# Patient Record
Sex: Female | Born: 1965 | Race: Black or African American | Hispanic: No | Marital: Married | Smoking: Never smoker
Health system: Southern US, Community
[De-identification: ages and names within clinical notes are randomized; demographics above are authoritative.]

## PROBLEM LIST (undated history)

## (undated) DIAGNOSIS — K219 Gastro-esophageal reflux disease without esophagitis: Secondary | ICD-10-CM

## (undated) DIAGNOSIS — M797 Fibromyalgia: Secondary | ICD-10-CM

## (undated) DIAGNOSIS — F329 Major depressive disorder, single episode, unspecified: Secondary | ICD-10-CM

## (undated) DIAGNOSIS — J449 Chronic obstructive pulmonary disease, unspecified: Secondary | ICD-10-CM

## (undated) DIAGNOSIS — J42 Unspecified chronic bronchitis: Secondary | ICD-10-CM

## (undated) DIAGNOSIS — E785 Hyperlipidemia, unspecified: Secondary | ICD-10-CM

## (undated) DIAGNOSIS — M545 Low back pain, unspecified: Secondary | ICD-10-CM

## (undated) DIAGNOSIS — T7840XA Allergy, unspecified, initial encounter: Secondary | ICD-10-CM

## (undated) DIAGNOSIS — M1990 Unspecified osteoarthritis, unspecified site: Secondary | ICD-10-CM

## (undated) DIAGNOSIS — F419 Anxiety disorder, unspecified: Secondary | ICD-10-CM

## (undated) DIAGNOSIS — F32A Depression, unspecified: Secondary | ICD-10-CM

## (undated) HISTORY — DX: Fibromyalgia: M79.7

## (undated) HISTORY — DX: Unspecified osteoarthritis, unspecified site: M19.90

## (undated) HISTORY — DX: Allergy, unspecified, initial encounter: T78.40XA

## (undated) HISTORY — PX: ABDOMINAL HYSTERECTOMY: SHX81

## (undated) HISTORY — DX: Hyperlipidemia, unspecified: E78.5

## (undated) HISTORY — PX: WISDOM TOOTH EXTRACTION: SHX21

## (undated) HISTORY — DX: Low back pain: M54.5

## (undated) HISTORY — DX: Unspecified chronic bronchitis: J42

## (undated) HISTORY — DX: Low back pain, unspecified: M54.50

## (undated) HISTORY — DX: Gastro-esophageal reflux disease without esophagitis: K21.9

---

## 1997-12-18 ENCOUNTER — Encounter: Admission: RE | Admit: 1997-12-18 | Discharge: 1997-12-18 | Payer: Self-pay | Admitting: Internal Medicine

## 1997-12-29 ENCOUNTER — Ambulatory Visit (HOSPITAL_COMMUNITY): Admission: RE | Admit: 1997-12-29 | Discharge: 1997-12-29 | Payer: Self-pay | Admitting: Obstetrics

## 1998-01-01 ENCOUNTER — Encounter: Admission: RE | Admit: 1998-01-01 | Discharge: 1998-01-01 | Payer: Self-pay | Admitting: Internal Medicine

## 1998-07-05 ENCOUNTER — Encounter: Admission: RE | Admit: 1998-07-05 | Discharge: 1998-07-05 | Payer: Self-pay | Admitting: Internal Medicine

## 1998-08-10 ENCOUNTER — Encounter: Admission: RE | Admit: 1998-08-10 | Discharge: 1998-08-10 | Payer: Self-pay | Admitting: Hematology and Oncology

## 1998-08-31 ENCOUNTER — Encounter: Admission: RE | Admit: 1998-08-31 | Discharge: 1998-08-31 | Payer: Self-pay | Admitting: Obstetrics & Gynecology

## 1998-08-31 ENCOUNTER — Other Ambulatory Visit: Admission: RE | Admit: 1998-08-31 | Discharge: 1998-08-31 | Payer: Self-pay | Admitting: Obstetrics & Gynecology

## 1999-06-28 ENCOUNTER — Encounter: Admission: RE | Admit: 1999-06-28 | Discharge: 1999-06-28 | Payer: Self-pay | Admitting: Obstetrics & Gynecology

## 1999-10-31 ENCOUNTER — Encounter: Admission: RE | Admit: 1999-10-31 | Discharge: 1999-10-31 | Payer: Self-pay | Admitting: Internal Medicine

## 1999-11-17 ENCOUNTER — Encounter: Admission: RE | Admit: 1999-11-17 | Discharge: 1999-11-17 | Payer: Self-pay | Admitting: Internal Medicine

## 2000-03-16 ENCOUNTER — Encounter: Admission: RE | Admit: 2000-03-16 | Discharge: 2000-03-16 | Payer: Self-pay | Admitting: Internal Medicine

## 2000-06-26 ENCOUNTER — Encounter: Admission: RE | Admit: 2000-06-26 | Discharge: 2000-06-26 | Payer: Self-pay | Admitting: Obstetrics & Gynecology

## 2000-06-26 ENCOUNTER — Other Ambulatory Visit: Admission: RE | Admit: 2000-06-26 | Discharge: 2000-06-26 | Payer: Self-pay | Admitting: Obstetrics & Gynecology

## 2000-07-06 ENCOUNTER — Ambulatory Visit (HOSPITAL_COMMUNITY): Admission: RE | Admit: 2000-07-06 | Discharge: 2000-07-06 | Payer: Self-pay | Admitting: Obstetrics & Gynecology

## 2000-07-27 ENCOUNTER — Encounter: Admission: RE | Admit: 2000-07-27 | Discharge: 2000-07-27 | Payer: Self-pay | Admitting: Obstetrics & Gynecology

## 2000-08-17 ENCOUNTER — Encounter: Admission: RE | Admit: 2000-08-17 | Discharge: 2000-08-17 | Payer: Self-pay | Admitting: Obstetrics & Gynecology

## 2000-09-21 ENCOUNTER — Encounter: Admission: RE | Admit: 2000-09-21 | Discharge: 2000-09-21 | Payer: Self-pay | Admitting: Obstetrics & Gynecology

## 2000-11-20 ENCOUNTER — Encounter: Admission: RE | Admit: 2000-11-20 | Discharge: 2000-11-20 | Payer: Self-pay | Admitting: Obstetrics & Gynecology

## 2001-07-25 ENCOUNTER — Encounter: Admission: RE | Admit: 2001-07-25 | Discharge: 2001-07-25 | Payer: Self-pay | Admitting: Internal Medicine

## 2001-08-02 ENCOUNTER — Encounter: Admission: RE | Admit: 2001-08-02 | Discharge: 2001-08-02 | Payer: Self-pay | Admitting: Internal Medicine

## 2001-09-24 ENCOUNTER — Other Ambulatory Visit: Admission: RE | Admit: 2001-09-24 | Discharge: 2001-09-24 | Payer: Self-pay | Admitting: Obstetrics & Gynecology

## 2001-09-24 ENCOUNTER — Encounter: Admission: RE | Admit: 2001-09-24 | Discharge: 2001-09-24 | Payer: Self-pay | Admitting: Obstetrics & Gynecology

## 2002-08-19 ENCOUNTER — Encounter: Admission: RE | Admit: 2002-08-19 | Discharge: 2002-08-19 | Payer: Self-pay | Admitting: *Deleted

## 2002-09-11 ENCOUNTER — Encounter (INDEPENDENT_AMBULATORY_CARE_PROVIDER_SITE_OTHER): Payer: Self-pay | Admitting: *Deleted

## 2002-10-03 ENCOUNTER — Encounter: Admission: RE | Admit: 2002-10-03 | Discharge: 2002-10-03 | Payer: Self-pay | Admitting: Internal Medicine

## 2002-10-03 ENCOUNTER — Other Ambulatory Visit: Admission: RE | Admit: 2002-10-03 | Discharge: 2002-10-03 | Payer: Self-pay | Admitting: Internal Medicine

## 2002-10-10 ENCOUNTER — Encounter (INDEPENDENT_AMBULATORY_CARE_PROVIDER_SITE_OTHER): Payer: Self-pay | Admitting: *Deleted

## 2002-10-10 LAB — CONVERTED CEMR LAB: Pap Smear: NORMAL

## 2002-12-02 ENCOUNTER — Inpatient Hospital Stay (HOSPITAL_COMMUNITY): Admission: AD | Admit: 2002-12-02 | Discharge: 2002-12-02 | Payer: Self-pay | Admitting: *Deleted

## 2003-01-05 ENCOUNTER — Encounter: Admission: RE | Admit: 2003-01-05 | Discharge: 2003-01-05 | Payer: Self-pay | Admitting: Internal Medicine

## 2003-01-05 ENCOUNTER — Ambulatory Visit (HOSPITAL_COMMUNITY): Admission: RE | Admit: 2003-01-05 | Discharge: 2003-01-05 | Payer: Self-pay | Admitting: Internal Medicine

## 2003-02-24 ENCOUNTER — Inpatient Hospital Stay (HOSPITAL_COMMUNITY): Admission: AD | Admit: 2003-02-24 | Discharge: 2003-02-24 | Payer: Self-pay | Admitting: *Deleted

## 2003-09-18 ENCOUNTER — Encounter: Admission: RE | Admit: 2003-09-18 | Discharge: 2003-09-18 | Payer: Self-pay | Admitting: Internal Medicine

## 2003-12-31 ENCOUNTER — Emergency Department (HOSPITAL_COMMUNITY): Admission: EM | Admit: 2003-12-31 | Discharge: 2003-12-31 | Payer: Self-pay | Admitting: Family Medicine

## 2004-04-19 ENCOUNTER — Encounter: Admission: RE | Admit: 2004-04-19 | Discharge: 2004-04-19 | Payer: Self-pay | Admitting: Internal Medicine

## 2004-05-12 ENCOUNTER — Ambulatory Visit (HOSPITAL_COMMUNITY): Admission: RE | Admit: 2004-05-12 | Discharge: 2004-05-12 | Payer: Self-pay | Admitting: Obstetrics & Gynecology

## 2004-06-03 ENCOUNTER — Encounter (INDEPENDENT_AMBULATORY_CARE_PROVIDER_SITE_OTHER): Payer: Self-pay | Admitting: *Deleted

## 2004-06-03 ENCOUNTER — Inpatient Hospital Stay (HOSPITAL_COMMUNITY): Admission: RE | Admit: 2004-06-03 | Discharge: 2004-06-05 | Payer: Self-pay | Admitting: Obstetrics & Gynecology

## 2004-08-01 ENCOUNTER — Ambulatory Visit: Payer: Self-pay | Admitting: Internal Medicine

## 2004-09-06 ENCOUNTER — Emergency Department (HOSPITAL_COMMUNITY): Admission: EM | Admit: 2004-09-06 | Discharge: 2004-09-06 | Payer: Self-pay | Admitting: Family Medicine

## 2005-01-17 ENCOUNTER — Ambulatory Visit: Payer: Self-pay | Admitting: Internal Medicine

## 2005-03-02 ENCOUNTER — Ambulatory Visit: Payer: Self-pay | Admitting: Internal Medicine

## 2005-03-02 ENCOUNTER — Ambulatory Visit (HOSPITAL_COMMUNITY): Admission: RE | Admit: 2005-03-02 | Discharge: 2005-03-02 | Payer: Self-pay | Admitting: Internal Medicine

## 2005-03-16 ENCOUNTER — Ambulatory Visit: Payer: Self-pay | Admitting: Internal Medicine

## 2005-03-27 ENCOUNTER — Ambulatory Visit (HOSPITAL_COMMUNITY): Admission: RE | Admit: 2005-03-27 | Discharge: 2005-03-27 | Payer: Self-pay | Admitting: Internal Medicine

## 2005-08-25 ENCOUNTER — Ambulatory Visit: Payer: Self-pay | Admitting: Internal Medicine

## 2005-09-11 ENCOUNTER — Emergency Department (HOSPITAL_COMMUNITY): Admission: EM | Admit: 2005-09-11 | Discharge: 2005-09-11 | Payer: Self-pay | Admitting: Emergency Medicine

## 2005-09-13 ENCOUNTER — Ambulatory Visit (HOSPITAL_COMMUNITY): Admission: RE | Admit: 2005-09-13 | Discharge: 2005-09-13 | Payer: Self-pay | Admitting: Internal Medicine

## 2005-09-13 ENCOUNTER — Ambulatory Visit: Payer: Self-pay | Admitting: Internal Medicine

## 2005-12-11 ENCOUNTER — Ambulatory Visit: Payer: Self-pay | Admitting: Hospitalist

## 2006-03-27 ENCOUNTER — Ambulatory Visit: Payer: Self-pay | Admitting: Internal Medicine

## 2006-07-02 ENCOUNTER — Ambulatory Visit: Payer: Self-pay | Admitting: Internal Medicine

## 2006-07-02 ENCOUNTER — Encounter (INDEPENDENT_AMBULATORY_CARE_PROVIDER_SITE_OTHER): Payer: Self-pay | Admitting: *Deleted

## 2006-07-04 ENCOUNTER — Encounter (INDEPENDENT_AMBULATORY_CARE_PROVIDER_SITE_OTHER): Payer: Self-pay | Admitting: *Deleted

## 2006-07-04 ENCOUNTER — Ambulatory Visit (HOSPITAL_COMMUNITY): Admission: RE | Admit: 2006-07-04 | Discharge: 2006-07-04 | Payer: Self-pay | Admitting: Obstetrics & Gynecology

## 2006-07-13 ENCOUNTER — Encounter (INDEPENDENT_AMBULATORY_CARE_PROVIDER_SITE_OTHER): Payer: Self-pay | Admitting: *Deleted

## 2006-07-13 DIAGNOSIS — M545 Low back pain: Secondary | ICD-10-CM

## 2006-07-13 DIAGNOSIS — E785 Hyperlipidemia, unspecified: Secondary | ICD-10-CM

## 2006-08-19 DIAGNOSIS — J309 Allergic rhinitis, unspecified: Secondary | ICD-10-CM | POA: Insufficient documentation

## 2006-09-17 ENCOUNTER — Emergency Department (HOSPITAL_COMMUNITY): Admission: EM | Admit: 2006-09-17 | Discharge: 2006-09-17 | Payer: Self-pay | Admitting: Emergency Medicine

## 2006-09-20 ENCOUNTER — Ambulatory Visit: Payer: Self-pay | Admitting: Internal Medicine

## 2006-09-20 ENCOUNTER — Encounter (INDEPENDENT_AMBULATORY_CARE_PROVIDER_SITE_OTHER): Payer: Self-pay | Admitting: Internal Medicine

## 2006-11-07 ENCOUNTER — Telehealth: Payer: Self-pay | Admitting: *Deleted

## 2007-04-09 ENCOUNTER — Ambulatory Visit: Payer: Self-pay | Admitting: *Deleted

## 2007-04-09 DIAGNOSIS — M25539 Pain in unspecified wrist: Secondary | ICD-10-CM

## 2007-04-09 DIAGNOSIS — J45990 Exercise induced bronchospasm: Secondary | ICD-10-CM

## 2007-04-09 LAB — CONVERTED CEMR LAB
ALT: 10 units/L (ref 0–35)
Albumin: 4.1 g/dL (ref 3.5–5.2)
Alkaline Phosphatase: 54 units/L (ref 39–117)
BUN: 15 mg/dL (ref 6–23)
CO2: 22 meq/L (ref 19–32)
Calcium: 8.9 mg/dL (ref 8.4–10.5)
Cholesterol: 196 mg/dL (ref 0–200)
Glucose, Bld: 87 mg/dL (ref 70–99)
Total Bilirubin: 0.4 mg/dL (ref 0.3–1.2)
Total Protein: 6.7 g/dL (ref 6.0–8.3)
Triglycerides: 85 mg/dL (ref <150)
VLDL: 17 mg/dL (ref 0–40)

## 2007-04-10 ENCOUNTER — Telehealth (INDEPENDENT_AMBULATORY_CARE_PROVIDER_SITE_OTHER): Payer: Self-pay | Admitting: *Deleted

## 2007-05-08 ENCOUNTER — Encounter (INDEPENDENT_AMBULATORY_CARE_PROVIDER_SITE_OTHER): Payer: Self-pay | Admitting: *Deleted

## 2007-05-08 ENCOUNTER — Emergency Department (HOSPITAL_COMMUNITY): Admission: EM | Admit: 2007-05-08 | Discharge: 2007-05-08 | Payer: Self-pay | Admitting: Emergency Medicine

## 2007-05-08 DIAGNOSIS — K649 Unspecified hemorrhoids: Secondary | ICD-10-CM | POA: Insufficient documentation

## 2007-05-21 ENCOUNTER — Telehealth: Payer: Self-pay | Admitting: *Deleted

## 2007-06-19 ENCOUNTER — Telehealth (INDEPENDENT_AMBULATORY_CARE_PROVIDER_SITE_OTHER): Payer: Self-pay | Admitting: *Deleted

## 2007-10-11 ENCOUNTER — Encounter (INDEPENDENT_AMBULATORY_CARE_PROVIDER_SITE_OTHER): Payer: Self-pay | Admitting: *Deleted

## 2007-10-11 ENCOUNTER — Encounter (INDEPENDENT_AMBULATORY_CARE_PROVIDER_SITE_OTHER): Payer: Self-pay | Admitting: Infectious Diseases

## 2007-10-11 ENCOUNTER — Ambulatory Visit: Payer: Self-pay | Admitting: Infectious Disease

## 2007-10-11 LAB — CONVERTED CEMR LAB: Hgb A1c MFr Bld: 5.5 %

## 2007-10-14 ENCOUNTER — Telehealth (INDEPENDENT_AMBULATORY_CARE_PROVIDER_SITE_OTHER): Payer: Self-pay | Admitting: Infectious Diseases

## 2007-10-14 LAB — CONVERTED CEMR LAB
Basophils Absolute: 0 10*3/uL (ref 0.0–0.1)
Eosinophils Absolute: 0 10*3/uL (ref 0.0–0.7)
Eosinophils Relative: 1 % (ref 0–5)
LDL Cholesterol: 137 mg/dL — ABNORMAL HIGH (ref 0–99)
Lymphocytes Relative: 39 % (ref 12–46)
MCV: 87.7 fL (ref 78.0–100.0)
Neutro Abs: 2.7 10*3/uL (ref 1.7–7.7)
Platelets: 277 10*3/uL (ref 150–400)
RBC: 4.94 M/uL (ref 3.87–5.11)
RDW: 12.4 % (ref 11.5–15.5)
Total CHOL/HDL Ratio: 3.8
WBC: 5.2 10*3/uL (ref 4.0–10.5)

## 2007-10-18 ENCOUNTER — Encounter (INDEPENDENT_AMBULATORY_CARE_PROVIDER_SITE_OTHER): Payer: Self-pay | Admitting: *Deleted

## 2007-10-28 ENCOUNTER — Encounter (INDEPENDENT_AMBULATORY_CARE_PROVIDER_SITE_OTHER): Payer: Self-pay | Admitting: *Deleted

## 2007-11-13 ENCOUNTER — Telehealth: Payer: Self-pay | Admitting: *Deleted

## 2007-12-02 ENCOUNTER — Ambulatory Visit: Payer: Self-pay | Admitting: *Deleted

## 2008-03-09 ENCOUNTER — Ambulatory Visit: Payer: Self-pay | Admitting: Internal Medicine

## 2008-03-09 ENCOUNTER — Encounter (INDEPENDENT_AMBULATORY_CARE_PROVIDER_SITE_OTHER): Payer: Self-pay | Admitting: *Deleted

## 2008-03-09 DIAGNOSIS — M542 Cervicalgia: Secondary | ICD-10-CM | POA: Insufficient documentation

## 2008-03-10 LAB — CONVERTED CEMR LAB
Candida species: POSITIVE — AB
Chlamydia, DNA Probe: NEGATIVE
HDL: 52 mg/dL (ref >39)
LDL Cholesterol: 137 mg/dL — ABNORMAL HIGH (ref 0–99)
Total CHOL/HDL Ratio: 4.2
Triglycerides: 145 mg/dL (ref <150)
VLDL: 29 mg/dL (ref 0–40)

## 2008-03-11 ENCOUNTER — Telehealth: Payer: Self-pay | Admitting: *Deleted

## 2008-03-17 ENCOUNTER — Ambulatory Visit: Payer: Self-pay | Admitting: *Deleted

## 2008-03-17 ENCOUNTER — Encounter: Payer: Self-pay | Admitting: Internal Medicine

## 2008-03-17 LAB — CONVERTED CEMR LAB
Candida species: NEGATIVE
Chlamydia, DNA Probe: NEGATIVE
Trichomonal Vaginitis: NEGATIVE

## 2008-03-30 ENCOUNTER — Telehealth (INDEPENDENT_AMBULATORY_CARE_PROVIDER_SITE_OTHER): Payer: Self-pay | Admitting: *Deleted

## 2008-06-05 ENCOUNTER — Telehealth: Payer: Self-pay | Admitting: Internal Medicine

## 2008-09-02 ENCOUNTER — Telehealth (INDEPENDENT_AMBULATORY_CARE_PROVIDER_SITE_OTHER): Payer: Self-pay | Admitting: Internal Medicine

## 2008-09-07 ENCOUNTER — Emergency Department (HOSPITAL_COMMUNITY): Admission: EM | Admit: 2008-09-07 | Discharge: 2008-09-07 | Payer: Self-pay | Admitting: Family Medicine

## 2008-09-29 ENCOUNTER — Ambulatory Visit (HOSPITAL_COMMUNITY): Admission: RE | Admit: 2008-09-29 | Discharge: 2008-09-29 | Payer: Self-pay | Admitting: *Deleted

## 2008-09-29 ENCOUNTER — Ambulatory Visit: Payer: Self-pay | Admitting: *Deleted

## 2008-09-29 DIAGNOSIS — G609 Hereditary and idiopathic neuropathy, unspecified: Secondary | ICD-10-CM

## 2008-09-29 DIAGNOSIS — G4700 Insomnia, unspecified: Secondary | ICD-10-CM | POA: Insufficient documentation

## 2008-09-29 DIAGNOSIS — G629 Polyneuropathy, unspecified: Secondary | ICD-10-CM | POA: Insufficient documentation

## 2008-09-29 DIAGNOSIS — R002 Palpitations: Secondary | ICD-10-CM | POA: Insufficient documentation

## 2008-09-29 LAB — CONVERTED CEMR LAB
ALT: 12 units/L (ref 0–35)
Alkaline Phosphatase: 73 units/L (ref 39–117)
HDL: 53 mg/dL (ref >39)
LDL Cholesterol: 152 mg/dL — ABNORMAL HIGH (ref 0–99)
Sodium: 141 meq/L (ref 135–145)
Triglycerides: 125 mg/dL (ref <150)

## 2008-09-30 ENCOUNTER — Ambulatory Visit (HOSPITAL_COMMUNITY): Admission: RE | Admit: 2008-09-30 | Discharge: 2008-09-30 | Payer: Self-pay | Admitting: *Deleted

## 2008-09-30 ENCOUNTER — Encounter (INDEPENDENT_AMBULATORY_CARE_PROVIDER_SITE_OTHER): Payer: Self-pay | Admitting: *Deleted

## 2008-10-07 ENCOUNTER — Encounter: Admission: RE | Admit: 2008-10-07 | Discharge: 2008-10-07 | Payer: Self-pay | Admitting: Internal Medicine

## 2008-10-09 ENCOUNTER — Ambulatory Visit: Payer: Self-pay | Admitting: Internal Medicine

## 2008-10-09 ENCOUNTER — Encounter (INDEPENDENT_AMBULATORY_CARE_PROVIDER_SITE_OTHER): Payer: Self-pay | Admitting: Internal Medicine

## 2008-10-27 ENCOUNTER — Ambulatory Visit: Payer: Self-pay | Admitting: *Deleted

## 2008-10-27 DIAGNOSIS — R143 Flatulence: Secondary | ICD-10-CM

## 2008-10-27 DIAGNOSIS — R142 Eructation: Secondary | ICD-10-CM

## 2008-10-27 DIAGNOSIS — R141 Gas pain: Secondary | ICD-10-CM

## 2008-11-09 ENCOUNTER — Ambulatory Visit (HOSPITAL_COMMUNITY): Admission: RE | Admit: 2008-11-09 | Discharge: 2008-11-09 | Payer: Self-pay | Admitting: *Deleted

## 2008-11-24 ENCOUNTER — Ambulatory Visit: Payer: Self-pay | Admitting: *Deleted

## 2008-11-25 ENCOUNTER — Encounter: Admission: RE | Admit: 2008-11-25 | Discharge: 2008-12-25 | Payer: Self-pay | Admitting: *Deleted

## 2008-12-03 ENCOUNTER — Encounter (INDEPENDENT_AMBULATORY_CARE_PROVIDER_SITE_OTHER): Payer: Self-pay | Admitting: *Deleted

## 2008-12-03 ENCOUNTER — Ambulatory Visit (HOSPITAL_COMMUNITY): Admission: RE | Admit: 2008-12-03 | Discharge: 2008-12-03 | Payer: Self-pay | Admitting: *Deleted

## 2008-12-21 ENCOUNTER — Telehealth (INDEPENDENT_AMBULATORY_CARE_PROVIDER_SITE_OTHER): Payer: Self-pay | Admitting: *Deleted

## 2009-01-04 ENCOUNTER — Telehealth: Payer: Self-pay | Admitting: *Deleted

## 2009-01-05 ENCOUNTER — Inpatient Hospital Stay (HOSPITAL_COMMUNITY): Admission: AD | Admit: 2009-01-05 | Discharge: 2009-01-05 | Payer: Self-pay | Admitting: Obstetrics & Gynecology

## 2009-01-05 ENCOUNTER — Ambulatory Visit: Payer: Self-pay | Admitting: Advanced Practice Midwife

## 2009-01-27 ENCOUNTER — Ambulatory Visit: Payer: Self-pay | Admitting: *Deleted

## 2009-01-27 ENCOUNTER — Telehealth: Payer: Self-pay | Admitting: *Deleted

## 2009-01-27 LAB — CONVERTED CEMR LAB
Total CHOL/HDL Ratio: 4.5
Triglycerides: 91 mg/dL (ref <150)

## 2009-02-04 ENCOUNTER — Ambulatory Visit (HOSPITAL_COMMUNITY): Admission: RE | Admit: 2009-02-04 | Discharge: 2009-02-04 | Payer: Self-pay | Admitting: Internal Medicine

## 2009-02-04 ENCOUNTER — Telehealth (INDEPENDENT_AMBULATORY_CARE_PROVIDER_SITE_OTHER): Payer: Self-pay | Admitting: *Deleted

## 2009-02-22 ENCOUNTER — Telehealth: Payer: Self-pay | Admitting: *Deleted

## 2009-03-30 ENCOUNTER — Ambulatory Visit: Payer: Self-pay | Admitting: Internal Medicine

## 2009-03-30 ENCOUNTER — Encounter (INDEPENDENT_AMBULATORY_CARE_PROVIDER_SITE_OTHER): Payer: Self-pay | Admitting: Internal Medicine

## 2009-03-30 LAB — CONVERTED CEMR LAB
Cholesterol: 214 mg/dL — ABNORMAL HIGH (ref 0–200)
HDL: 51 mg/dL (ref >39)
Sed Rate: 13 mm/hr (ref 0–22)

## 2009-03-31 ENCOUNTER — Telehealth (INDEPENDENT_AMBULATORY_CARE_PROVIDER_SITE_OTHER): Payer: Self-pay | Admitting: Internal Medicine

## 2009-04-07 ENCOUNTER — Telehealth: Payer: Self-pay | Admitting: *Deleted

## 2009-04-12 ENCOUNTER — Ambulatory Visit: Payer: Self-pay | Admitting: Internal Medicine

## 2009-04-17 ENCOUNTER — Telehealth: Payer: Self-pay | Admitting: Internal Medicine

## 2009-05-03 ENCOUNTER — Ambulatory Visit: Payer: Self-pay | Admitting: Internal Medicine

## 2009-05-20 ENCOUNTER — Emergency Department (HOSPITAL_COMMUNITY): Admission: EM | Admit: 2009-05-20 | Discharge: 2009-05-20 | Payer: Self-pay | Admitting: Emergency Medicine

## 2009-05-20 ENCOUNTER — Telehealth: Payer: Self-pay | Admitting: Infectious Diseases

## 2009-06-11 ENCOUNTER — Ambulatory Visit: Payer: Self-pay | Admitting: Internal Medicine

## 2009-06-14 ENCOUNTER — Inpatient Hospital Stay (HOSPITAL_COMMUNITY): Admission: AD | Admit: 2009-06-14 | Discharge: 2009-06-14 | Payer: Self-pay | Admitting: Family Medicine

## 2009-06-25 ENCOUNTER — Ambulatory Visit: Payer: Self-pay | Admitting: Internal Medicine

## 2009-07-19 ENCOUNTER — Ambulatory Visit: Payer: Self-pay | Admitting: Internal Medicine

## 2009-07-19 ENCOUNTER — Encounter (INDEPENDENT_AMBULATORY_CARE_PROVIDER_SITE_OTHER): Payer: Self-pay | Admitting: Internal Medicine

## 2009-08-18 ENCOUNTER — Ambulatory Visit: Payer: Self-pay | Admitting: Internal Medicine

## 2009-08-24 LAB — CONVERTED CEMR LAB
Cholesterol: 236 mg/dL — ABNORMAL HIGH (ref 0–200)
HDL: 54 mg/dL (ref >39)
VLDL: 20 mg/dL (ref 0–40)

## 2009-11-10 ENCOUNTER — Telehealth (INDEPENDENT_AMBULATORY_CARE_PROVIDER_SITE_OTHER): Payer: Self-pay | Admitting: Internal Medicine

## 2009-11-10 ENCOUNTER — Ambulatory Visit: Payer: Self-pay | Admitting: Infectious Diseases

## 2009-11-10 LAB — CONVERTED CEMR LAB
Albumin: 4.1 g/dL (ref 3.5–5.2)
BUN: 15 mg/dL (ref 6–23)
Bilirubin Urine: NEGATIVE
CO2: 24 meq/L (ref 19–32)
Calcium: 9 mg/dL (ref 8.4–10.5)
Chloride: 103 meq/L (ref 96–112)
Protein, ur: NEGATIVE mg/dL
Sodium: 137 meq/L (ref 135–145)
Specific Gravity, Urine: 1.013 (ref 1.005–1.0)
Total Bilirubin: 0.4 mg/dL (ref 0.3–1.2)
Urine Glucose: NEGATIVE mg/dL
pH: 6.5 (ref 5.0–8.0)

## 2009-12-01 ENCOUNTER — Telehealth: Payer: Self-pay | Admitting: *Deleted

## 2009-12-02 ENCOUNTER — Encounter (INDEPENDENT_AMBULATORY_CARE_PROVIDER_SITE_OTHER): Payer: Self-pay | Admitting: Internal Medicine

## 2009-12-02 ENCOUNTER — Ambulatory Visit: Payer: Self-pay | Admitting: Internal Medicine

## 2009-12-02 DIAGNOSIS — M62838 Other muscle spasm: Secondary | ICD-10-CM

## 2009-12-02 LAB — CONVERTED CEMR LAB
ALT: 12 units/L (ref 0–35)
Alkaline Phosphatase: 64 units/L (ref 39–117)
Total Protein: 7.3 g/dL (ref 6.0–8.3)

## 2009-12-07 ENCOUNTER — Encounter: Admission: RE | Admit: 2009-12-07 | Discharge: 2009-12-07 | Payer: Self-pay | Admitting: Family Medicine

## 2009-12-07 ENCOUNTER — Ambulatory Visit: Payer: Self-pay | Admitting: Sports Medicine

## 2009-12-07 ENCOUNTER — Ambulatory Visit: Payer: Self-pay | Admitting: Family Medicine

## 2009-12-08 ENCOUNTER — Encounter: Payer: Self-pay | Admitting: Sports Medicine

## 2009-12-13 LAB — CONVERTED CEMR LAB
ANA Titer 1: NEGATIVE
Anti Nuclear Antibody(ANA): POSITIVE — AB
CRP: 0.6 mg/dL — ABNORMAL HIGH (ref <0.6)
Cyclic Citrullin Peptide Ab: 0.4 units (ref <7)
Rhuematoid fact SerPl-aCnc: <20 intl units/mL (ref 0–20)
Uric Acid, Serum: 5.3 mg/dL (ref 2.4–7.0)

## 2010-01-04 ENCOUNTER — Encounter: Admission: RE | Admit: 2010-01-04 | Discharge: 2010-01-04 | Payer: Self-pay | Admitting: Internal Medicine

## 2010-01-04 ENCOUNTER — Ambulatory Visit: Payer: Self-pay | Admitting: Sports Medicine

## 2010-01-06 ENCOUNTER — Telehealth: Payer: Self-pay | Admitting: Family Medicine

## 2010-01-11 ENCOUNTER — Telehealth (INDEPENDENT_AMBULATORY_CARE_PROVIDER_SITE_OTHER): Payer: Self-pay | Admitting: Internal Medicine

## 2010-01-12 ENCOUNTER — Encounter: Payer: Self-pay | Admitting: *Deleted

## 2010-01-12 ENCOUNTER — Telehealth (INDEPENDENT_AMBULATORY_CARE_PROVIDER_SITE_OTHER): Payer: Self-pay | Admitting: Internal Medicine

## 2010-01-12 ENCOUNTER — Ambulatory Visit: Payer: Self-pay | Admitting: Internal Medicine

## 2010-01-12 LAB — CONVERTED CEMR LAB
Albumin: 4 g/dL (ref 3.5–5.2)
Bilirubin, Direct: 0.1 mg/dL (ref 0.0–0.3)
HDL: 47 mg/dL (ref >39)
Total CHOL/HDL Ratio: 4
Total Protein: 6.6 g/dL (ref 6.0–8.3)
Triglycerides: 87 mg/dL (ref <150)

## 2010-01-14 ENCOUNTER — Telehealth (INDEPENDENT_AMBULATORY_CARE_PROVIDER_SITE_OTHER): Payer: Self-pay | Admitting: Internal Medicine

## 2010-01-19 ENCOUNTER — Ambulatory Visit: Payer: Self-pay | Admitting: Internal Medicine

## 2010-01-19 LAB — CONVERTED CEMR LAB: Amylase: 60 units/L (ref 0–105)

## 2010-01-20 ENCOUNTER — Ambulatory Visit: Payer: Self-pay | Admitting: Internal Medicine

## 2010-01-22 ENCOUNTER — Encounter: Payer: Self-pay | Admitting: Internal Medicine

## 2010-01-24 ENCOUNTER — Encounter (INDEPENDENT_AMBULATORY_CARE_PROVIDER_SITE_OTHER): Payer: Self-pay | Admitting: Internal Medicine

## 2010-01-27 ENCOUNTER — Encounter (INDEPENDENT_AMBULATORY_CARE_PROVIDER_SITE_OTHER): Payer: Self-pay | Admitting: Internal Medicine

## 2010-02-16 ENCOUNTER — Encounter: Payer: Self-pay | Admitting: Internal Medicine

## 2010-02-17 ENCOUNTER — Emergency Department (HOSPITAL_COMMUNITY): Admission: EM | Admit: 2010-02-17 | Discharge: 2010-02-17 | Payer: Self-pay | Admitting: Family Medicine

## 2010-02-17 ENCOUNTER — Telehealth: Payer: Self-pay | Admitting: *Deleted

## 2010-02-24 ENCOUNTER — Telehealth (INDEPENDENT_AMBULATORY_CARE_PROVIDER_SITE_OTHER): Payer: Self-pay | Admitting: *Deleted

## 2010-03-28 ENCOUNTER — Telehealth: Payer: Self-pay | Admitting: *Deleted

## 2010-04-08 ENCOUNTER — Ambulatory Visit: Payer: Self-pay | Admitting: Internal Medicine

## 2010-04-08 ENCOUNTER — Encounter: Payer: Self-pay | Admitting: Internal Medicine

## 2010-04-08 LAB — CONVERTED CEMR LAB
BUN: 13 mg/dL (ref 6–23)
Creatinine, Ser: 0.92 mg/dL (ref 0.40–1.20)
Glucose, Bld: 91 mg/dL (ref 70–99)
LDL Cholesterol: 155 mg/dL — ABNORMAL HIGH (ref 0–99)
Potassium: 4.2 meq/L (ref 3.5–5.3)
Sed Rate: 32 mm/hr — ABNORMAL HIGH (ref 0–22)
Sodium: 139 meq/L (ref 135–145)
Total CHOL/HDL Ratio: 4.2
Total Protein: 7.8 g/dL (ref 6.0–8.3)

## 2010-04-12 ENCOUNTER — Telehealth: Payer: Self-pay

## 2010-04-15 ENCOUNTER — Telehealth (INDEPENDENT_AMBULATORY_CARE_PROVIDER_SITE_OTHER): Payer: Self-pay | Admitting: *Deleted

## 2010-04-15 ENCOUNTER — Ambulatory Visit: Payer: Self-pay | Admitting: Internal Medicine

## 2010-04-29 ENCOUNTER — Encounter: Payer: Self-pay | Admitting: Internal Medicine

## 2010-06-01 ENCOUNTER — Ambulatory Visit: Payer: Self-pay | Admitting: Internal Medicine

## 2010-07-07 ENCOUNTER — Telehealth: Payer: Self-pay | Admitting: Internal Medicine

## 2010-07-15 ENCOUNTER — Telehealth: Payer: Self-pay | Admitting: Internal Medicine

## 2010-08-18 ENCOUNTER — Encounter: Payer: Self-pay | Admitting: Internal Medicine

## 2010-08-18 ENCOUNTER — Ambulatory Visit: Payer: Self-pay | Admitting: Internal Medicine

## 2010-08-18 LAB — CONVERTED CEMR LAB
Bilirubin Urine: NEGATIVE
HDL: 57 mg/dL (ref >39)
LDL Cholesterol: 149 mg/dL — ABNORMAL HIGH (ref 0–99)
Nitrite: NEGATIVE
Specific Gravity, Urine: 1.025
Triglycerides: 102 mg/dL (ref <150)
Urobilinogen, UA: 0.2
pH: 5.5

## 2010-08-29 ENCOUNTER — Telehealth: Payer: Self-pay | Admitting: Internal Medicine

## 2010-08-29 ENCOUNTER — Encounter: Payer: Self-pay | Admitting: Internal Medicine

## 2010-10-01 ENCOUNTER — Encounter: Payer: Self-pay | Admitting: Gastroenterology

## 2010-10-02 ENCOUNTER — Encounter: Payer: Self-pay | Admitting: Gastroenterology

## 2010-10-02 ENCOUNTER — Encounter: Payer: Self-pay | Admitting: *Deleted

## 2010-10-02 ENCOUNTER — Encounter: Payer: Self-pay | Admitting: Obstetrics & Gynecology

## 2010-10-11 NOTE — Progress Notes (Signed)
Summary: phone/gg  Phone Note Call from Patient   Caller: Patient Summary of Call: Pt called with c/o non-productive cough, sinus congestion. Pt using  otc meds, drinking hot tea etc.  Using inhaler. Onset 1 week ago.  We are unable to see today or tomorrow. Pt instructed to go to [facility] today for evaluation Initial call taken by: Merrie Roof RN,  February 17, 2010 11:59 AM

## 2010-10-11 NOTE — Progress Notes (Signed)
Summary: refill/ hla  Phone Note Refill Request Message from:  Fax from Pharmacy on March 28, 2010 9:47 AM  Refills Requested: Medication #1:  FLEXERIL 10 MG TABS Take 1 tablet by mouth three times a day as needed for pain/spasms   Dosage confirmed as above?Dosage Confirmed   Last Refilled: 6/24 last visit 01/2010  Initial call taken by: Marin Roberts RN,  March 28, 2010 9:47 AM  Follow-up for Phone Call        Refill approved-nurse to complete  No app't scheduled.  2 no-shows. Will refill for 1 month -- needs app't. Follow-up by: Ulyess Mort MD,  April 05, 2010 12:52 PM    Prescriptions: FLEXERIL 10 MG TABS (CYCLOBENZAPRINE HCL) Take 1 tablet by mouth three times a day as needed for pain/spasms  #60 x 0   Entered and Authorized by:   Ulyess Mort MD   Signed by:   Ulyess Mort MD on 04/05/2010   Method used:   Electronically to        [facility] (retail)       [address]       [place], [place]  [id number]       Ph: [phone]       Fax: [phone]   RxID:   [id number]   Appended Document: refill/ hla appt made for 7/29

## 2010-10-11 NOTE — Progress Notes (Signed)
Summary: Refill/gh  Phone Note Refill Request Message from:  Fax from Pharmacy on July 15, 2010 3:19 PM  Refills Requested: Medication #1:  HYDROCORTISONE BUTYRATE 0.1 % CREA apply on your skin two times a day as needed.   Last Refilled: 06/17/2010 Last labs and office vivit were 04/08/2010.   Method Requested: Electronic Initial call taken by: Angelina Ok RN,  July 15, 2010 3:19 PM  Follow-up for Phone Call       Follow-up by: Blanch Media MD,  July 15, 2010 3:36 PM    Prescriptions: HYDROCORTISONE BUTYRATE 0.1 % CREA (HYDROCORTISONE BUTYRATE) apply on your skin two times a day as needed  #15 gms x 1   Entered and Authorized by:   Blanch Media MD   Signed by:   Blanch Media MD on 07/15/2010   Method used:   Electronically to        [facility] (retail)       [address]       [place], [place]  [id number]       Ph: [phone]       Fax: [phone]   RxID:   [id number]   Appended Document: "stomach pain"/pcp-iilath/hla blank

## 2010-10-11 NOTE — Progress Notes (Signed)
Summary: appt/ hla  Phone Note Call from Patient   Summary of Call: pt c/o weak hands and wrists, ongoing" since dr Lyda Perone was there, i want to see a specialist", appt is made Initial call taken by: Marin Roberts RN,  December 01, 2009 4:55 PM

## 2010-10-11 NOTE — Progress Notes (Signed)
Summary: lab results/ hla  Phone Note Call from Patient   Summary of Call: pt calls for lab results, angry that she has not been called. dr Mikinzie Maciejewski paged. dr Tama Grosz called back, labs results read to her, will call pt back per dr Loany Neuroth and give results. pt called informed, is pleased. Initial call taken by: Marin Roberts RN,  Jan 14, 2010 3:33 PM  Follow-up for Phone Call        Thanks so much! Follow-up by: Silvestre Gunner MD,  Jan 14, 2010 5:41 PM

## 2010-10-11 NOTE — Assessment & Plan Note (Signed)
Summary: PER HELEN ABD./SB.   Vital Signs:  Patient profile:   45 year old female Height:      62 inches Weight:      147.5 pounds BMI:     27.08 Temp:     98.5 degrees F Pulse rate:   100 / minute BP sitting:   128 / 83  Vitals Entered By: Filomena Jungling NT II (Jan 19, 2010 8:43 AM) CC: stomach fills up Pain Assessment Patient in pain? no      Nutritional Status BMI of 25 - 29 = overweight  Have you ever been in a relationship where you felt threatened, hurt or afraid?No   Does patient need assistance? Functional Status Self care Ambulation Normal   Primary Care Provider:  Silvestre Gunner MD  CC:  stomach fills up.  History of Present Illness: 45 year old with Past Medical History: Fibromyalgia Hyperlipidemia Low back pain Family hx DM Allergic rhinitis   She presents complaining of abdominal distension since 2004. She had a  pelvic US that was normal. She has abdominal pain after she has distension. She has also hearthburn.  She denies diarrhea. She has history of constipation, she has a bowel movement every other day.  No history of pancreatitis. No history of weight loss. She is also complaining of early satiety.  Preventive Screening-Counseling & Management  Alcohol-Tobacco     Alcohol drinks/day: 0     Smoking Status: never  Caffeine-Diet-Exercise     Does Patient Exercise: yes     Type of exercise: WALKING     Times/week: 3  Current Medications (verified): 1)  Fish Oil  Caps (Omega-3 Fatty Acids Caps) .... Use As Directed 2)  Proair Hfa 108 (90 Base) Mcg/act Aers (Albuterol Sulfate) .... Inhale 2 Puffs Four Times A Day As Needed 3)  Calcarb 600/d 600-125 Mg-Unit Tabs (Calcium-Vitamin D) .... Take One Tablet By Mouth Three Times A Day With Meals 4)  Anacin 81 Mg  Tbec (Aspirin) .... Take One Tablet By Mouth Daily 5)  Doxepin Hcl 25 Mg Caps (Doxepin Hcl) .... Take One Capsule By Mouth At Night 6)  Ibu 800 Mg  Tabs (Ibuprofen) .... Take 1 Tablet By Mouth  Every Eight Hours With Food As Needed For Pain. 7)  Cortisporin 0.5-0.5-10000 Crea (Neomycin-Polymyxin-Hc) .... Apply To Ear Canal Twice Daily Until Pain Resolves. 8)  Flexeril 10 Mg Tabs (Cyclobenzaprine Hcl) .... Take 1 Tablet By Mouth Three Times A Day As Needed For Pain/spasms 9)  Fluocinonide 0.05 % Soln (Fluocinonide) .... Apply As Previously Done 10)  Pataday 0.2 % Soln (Olopatadine Hcl) .... Apply To Eye As Directed (As Previously Done) 11)  Red Yeast Rice 600 Mg Tabs (Red Yeast Rice Extract) .... Take 1 Tablet Twice Daily 12)  Nasonex 50 Mcg/act Susp (Mometasone Furoate) .... 2 Sprays in Each Nostril Daily 13)  Voltaren 1 %  Gel (Diclofenac Sodium) .... Apply 4 Times Daily As Needed Wrist Pain (Failure, Multiple Oral Nsaids)  Allergies: 1)  ! Codeine 2)  ! Pravachol (Pravastatin Sodium) 3)  Zocor  Review of Systems  The patient denies fever, weight loss, chest pain, syncope, dyspnea on exertion, peripheral edema, prolonged cough, headaches, hemoptysis, melena, and hematochezia.    Physical Exam  General:  alert, well-developed, and well-nourished.   Head:  normocephalic and atraumatic.   Lungs:  normal respiratory effort, no intercostal retractions, no accessory muscle use, and normal breath sounds.   Heart:  normal rate and regular rhythm.   Abdomen:  soft, non-tender, normal bowel sounds, no distention, no masses, no guarding, no rigidity, no rebound tenderness, and no abdominal hernia.   Extremities:  No edema.    Impression & Recommendations:  Problem # 1:  ABDOMINAL DISTENSION (ICD-787.3) Unclear etiology, differential: due to constipation, PUD, H pylori  infection, undigestion. I will check lipse, amylase, stool antigen H. Pylori, UA. She might benefit form endoscopy, if endoscopy is negative she might needt ct abdomen, pelvis. I will refer to gastroenterologist. I will precribe omeprazole and bowel regimen.  Orders: T-Lipase [phone]) T-Amylase [id number]) T-H  Pylori AG, EIA ([id number]) Gastroenterology Referral (GI) T-Urinalysis Dipstick only ([id number])  Complete Medication List: 1)  Fish Oil Caps (Omega-3 fatty acids caps) .... Use as directed 2)  Proair Hfa 108 (90 Base) Mcg/act Aers (Albuterol sulfate) .... Inhale 2 puffs four times a day as needed 3)  Calcarb 600/d 600-125 Mg-unit Tabs (Calcium-vitamin d) .... Take one tablet by mouth three times a day with meals 4)  Anacin 81 Mg Tbec (Aspirin) .... Take one tablet by mouth daily 5)  Doxepin Hcl 25 Mg Caps (Doxepin hcl) .... Take one capsule by mouth at night 6)  Ibu 800 Mg Tabs (Ibuprofen) .... Take 1 tablet by mouth every eight hours with food as needed for pain. 7)  Cortisporin 0.5-0.5-10000 Crea (Neomycin-polymyxin-hc) .... Apply to ear canal twice daily until pain resolves. 8)  Flexeril 10 Mg Tabs (Cyclobenzaprine hcl) .... Take 1 tablet by mouth three times a day as needed for pain/spasms 9)  Fluocinonide 0.05 % Soln (Fluocinonide) .... Apply as previously done 10)  Pataday 0.2 % Soln (Olopatadine hcl) .... Apply to eye as directed (as previously done) 11)  Red Yeast Rice 600 Mg Tabs (Red yeast rice extract) .... Take 1 tablet twice daily 12)  Nasonex 50 Mcg/act Susp (Mometasone furoate) .... 2 sprays in each nostril daily 13)  Voltaren 1 % Gel (Diclofenac sodium) .... Apply 4 times daily as needed wrist pain (failure, multiple oral nsaids) 14)  Omeprazole 40 Mg Cpdr (Omeprazole) .... Take 1 tablet by mouth daily. 15)  Senna-docusate Sodium 8.6-50 Mg Tabs (Sennosides-docusate sodium) .... Take 1 tablet every 12 hour for constipation as needed.  Patient Instructions: 1)  Please schedule a follow-up appointment in 1 month. 2)  You have two prescription on you [facility]. 3)  Will call you with lab result. Prescriptions: SENNA-DOCUSATE SODIUM 8.6-50 MG TABS (SENNOSIDES-DOCUSATE SODIUM) Take 1 tablet every 12 hour for constipation as needed.  #60 x 3   Entered and Authorized by:    Hartley Barefoot MD   Signed by:   Hartley Barefoot MD on 01/19/2010   Method used:   Electronically to        [facility] (retail)       [address]       [place], [place]  [address]       Ph: [phone]       Fax: [phone]   RxID:   [id number] OMEPRAZOLE 40 MG CPDR (OMEPRAZOLE) Take 1 tablet by mouth daily.  #30 x 3   Entered and Authorized by:   Hartley Barefoot MD   Signed by:   Hartley Barefoot MD on 01/19/2010   Method used:   Electronically to        [facility] (retail)       [address]       [place], [place]  [address]       Ph:  [phone]       Fax: [phone]   RxID:   [id number]   Prevention & Chronic Care Immunizations   Influenza vaccine: Fluvax Non-MCR  (06/11/2009)    Tetanus booster: Not documented    Pneumococcal vaccine: Not documented  Other Screening   Pap smear: Normal  (10/10/2002)   Pap smear action/deferral: Not indicated S/P hysterectomy  (03/30/2009)    Mammogram: ASSESSMENT: Negative - BI-RADS 1^MM DIGITAL SCREENING  (01/04/2010)   Mammogram due: 10/07/2009   Smoking status: never  (01/19/2010)  Lipids   Total Cholesterol: 187  (01/12/2010)   LDL: 123  (01/12/2010)   LDL Direct: Not documented   HDL: 47  (01/12/2010)   Triglycerides: 87  (01/12/2010)   Lipid panel due: 09/30/2009    SGOT (AST): 12  (01/12/2010)   SGPT (ALT): 12  (01/12/2010)   Alkaline phosphatase: 73  (01/12/2010)   Total bilirubin: 0.4  (01/12/2010)  Self-Management Support :   Personal Goals (by the next clinic visit) :      Personal LDL goal: 100  (11/10/2009)    Patient will work on the following items until the next clinic visit to reach self-care goals:     Medications and monitoring: take my medicines every day  (01/19/2010)     Eating: drink diet soda or water instead of juice or soda, eat more vegetables, use fresh or frozen vegetables, eat foods that are low in salt, eat baked foods  instead of fried foods, eat fruit for snacks and desserts, limit or avoid alcohol  (01/19/2010)     Activity: take a 30 minute walk every day  (01/19/2010)    Lipid self-management support: Resources for patients handout  (12/02/2009)   Process Orders Check Orders Results:     [facility]: ABN not required for this insurance Tests Sent for requisitioning (Jan 19, 2010 8:14 PM):     01/19/2010: [facility] -- T-Lipase [phone] (signed)     01/19/2010: [facility] -- T-Amylase [phone] (signed)     01/19/2010: [facility] -- T-H Pylori AG, EIA [[id number]] (signed)    Process Orders Check Orders Results:     [facility]: ABN not required for this insurance Tests Sent for requisitioning (Jan 19, 2010 8:14 PM):     01/19/2010: [facility] -- T-Lipase [phone] (signed)     01/19/2010: [facility] -- T-Amylase [phone] (signed)     01/19/2010: [facility] -- T-H Pylori AG, EIA [[id number]] (signed)

## 2010-10-11 NOTE — Progress Notes (Signed)
  Phone Note Outgoing Call   Call placed by: Filomena Jungling NT II,  April 12, 2010 3:18 PM Call placed to: Patient Action Taken: Appt scheduled Request: Send information Summary of Call: EYE APPOINTMENT WITH DR.MCFARLAND [address] --OFFICIE NUMBER IS [id number].APPOINNTMENT IS AUGUST 17,2011 AT 1:00. Initial call taken by: Filomena Jungling NT II,  April 12, 2010 3:19 PM

## 2010-10-11 NOTE — Miscellaneous (Signed)
Summary: [facility]   Imported By: Margie Billet 02/17/2010 10:10:08  _____________________________________________________________________  External Attachment:    Type:   Image     Comment:   External Document

## 2010-10-11 NOTE — Assessment & Plan Note (Signed)
Summary: TB skin test/gg  Nurse Visit   Allergies: 1)  ! Codeine 2)  ! Pravachol (Pravastatin Sodium) 3)  Zocor  Immunizations Administered:  PPD Skin Test:    Vaccine Type: PPD    Site: left forearm    Mfr: Sanofi Pasteur    Dose: 0.1 ml    Route: ID    Given by: Angelina Ok RN    Exp. Date: 07/14/2011    Lot #: [id number]  PPD Results    Date of reading: 04/18/2010    Results: < 5mm    Interpretation: negative  Orders Added: 1)  TB Skin Test [86580] 2)  Admin 1st Vaccine [16109]

## 2010-10-11 NOTE — Progress Notes (Signed)
Summary: phone/gg  Phone Note Call from Patient   Caller: Patient Complaint: Chest Pain Summary of Call: Pt seen at [facility] last week and was treated for sinus infection,  with amoxicililim for 10 days. She now has vaginal yeast infection and request medication. Will you order med? Initial call taken by: Merrie Roof RN,  February 24, 2010 2:55 PM  Follow-up for Phone Call        I tried to call pt - got answering machine  Makes sense that she would get yeast infxn after ABX.  Pt seems to come to clinic regularly.  WIll Rx Diflucan  150 one dose.  If no improvement,will need to be seen for evaluation   Follow-up by: Blanch Media MD,  February 24, 2010 4:31 PM  Additional Follow-up for Phone Call Additional follow up Details #1::        Pt informed and Patient/caller verbalizes understanding of these instructions.  Additional Follow-up by: Merrie Roof RN,  February 24, 2010 5:32 PM    New/Updated Medications: FLUCONAZOLE 150 MG TABS (FLUCONAZOLE) Take the one pill by mouth.  If symptoms do not improve, you will need to make an appoitment to be seen. Prescriptions: FLUCONAZOLE 150 MG TABS (FLUCONAZOLE) Take the one pill by mouth.  If symptoms do not improve, you will need to make an appoitment to be seen.  #1 x 0   Entered and Authorized by:   Blanch Media MD   Signed by:   Blanch Media MD on 02/24/2010   Method used:   Electronically to        [facility] (retail)       [address]       [place], [place]  [id number]       Ph: [phone]       Fax: [phone]   RxID:   [id number]

## 2010-10-11 NOTE — Progress Notes (Signed)
Summary: appt/ hla  Phone Note Other Incoming   Summary of Call: pt presents c/o bloating and pain after food intake, she states this is nothing new...has been ongoing since dr Lyda Perone was seeing her. appt is made for 5/11 to address this problem. Initial call taken by: Marin Roberts RN,  Jan 14, 2010 3:29 PM    Can she be schedule with her PCP, that must know the patient.

## 2010-10-11 NOTE — Progress Notes (Signed)
----   Converted from flag ---- ---- 01/06/2010 2:47 PM, Lillia Pauls CMA wrote: ---- 01/06/2010 2:06 PM, Marily Memos wrote: Pt states she is in pain since she had the injection in her hands.  She states she can't use her left hand. Pt contact #  [phone]. ------------------------------  This number disconnected.  Attempted to call all numbers listed in chart, left message at [phone] number listed in chart. Asked patient to call back with a workable number, and I would be happy to check in on her.  Appended Document:  Again attempted phone calls, LMOM on home number. Call blocked on the listed work number. 933 number disconnected.  Appended Document:  Called numbers again. The [phone] number does not belong to this patient. Spoke with this individual who lives at this residence. No knowledge of patient.  Multiple attempts to contact, messages left. No reattempt of contact attempted by patient. I am comfortable letting her contact us if problems only.  Often post injection, there can be some flare for 2-3 days until corticosteroid begins to work.

## 2010-10-11 NOTE — Progress Notes (Signed)
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  She had the last skin test  05/03/2009. She will come in today for the test.  Will this be okay? Initial call taken by: Merrie Roof RN,  April 15, 2010 10:43 AM  Follow-up for Phone Call        yes Follow-up by: Zoila Shutter MD,  April 15, 2010 12:17 PM

## 2010-10-11 NOTE — Assessment & Plan Note (Signed)
Summary: CHECKUP/SB.   Vital Signs:  Patient profile:   45 year old female Height:      62.5 inches (158.75 cm) Weight:      149.8 pounds (68.09 kg) BMI:     27.06 Temp:     97.9 degrees F oral Pulse rate:   98 / minute BP sitting:   112 / 76  (right arm)  Vitals Entered By: Chinita Pester RN (November 10, 2009 8:40 AM) CC: F/U visit. At night. legs/feet swell. Congestion nose/ears. Is Patient Diabetic? No Pain Assessment Patient in pain? no      Nutritional Status BMI of 25 - 29 = overweight  Have you ever been in a relationship where you felt threatened, hurt or afraid?No   Does patient need assistance? Functional Status Self care Ambulation Normal   Primary Care Provider:  Manning Charity MD  CC:  F/U visit. At night. legs/feet swell. Congestion nose/ears..  History of Present Illness: [name] is a 45 yo F with h/o HLD who presents for cholesterol check and for a few concerns she has. Her cholesterol was last checked 12/10 and showed LDL = 162 and HDL = 54. She would like her cholesterol to be better controlled, but she has been intolerant of several statins due to myalgias (patient has some baseline muscle aches). She feels that she eats pretty healthy, rarely eats fried foods, and gets a little exercise.   She also c/o swelling of her hands/feet at night, particularly after she eats something. Her feet also itch at night and have not been alleviated by creams. She occasionally takes Benadryl, which helps.  Finally, she c/o a sinus infection ("pressure that can't get out") that has been going on for a couple months, the worst in the past 1 month or so. She feels her lymph nodes in her neck are swollen, and she reports a subjective fever last Saturday.  Preventive Screening-Counseling & Management  Alcohol-Tobacco     Alcohol drinks/day: 0     Smoking Status: never  Caffeine-Diet-Exercise     Does Patient Exercise: yes     Type of exercise: WALKING     Times/week:  3  Current Medications (verified): 1)  Fish Oil  Caps (Omega-3 Fatty Acids Caps) .... Use As Directed 2)  Proair Hfa 108 (90 Base) Mcg/act Aers (Albuterol Sulfate) .... Inhale 2 Puffs Four Times A Day As Needed 3)  Calcarb 600/d 600-125 Mg-Unit Tabs (Calcium-Vitamin D) .... Take One Tablet By Mouth Three Times A Day With Meals 4)  Anacin 81 Mg  Tbec (Aspirin) .... Take One Tablet By Mouth Daily 5)  Doxepin Hcl 25 Mg Caps (Doxepin Hcl) .... Take One Capsule By Mouth At Night 6)  Ibu 800 Mg  Tabs (Ibuprofen) .... Take 1 Tablet By Mouth Every Eight Hours With Food As Needed For Pain. 7)  Hydrocortisone 2.5 % Lotn (Hydrocortisone) .... Apply To Affected Areas Twice A Day. 8)  Cortisporin 0.5-0.5-10000 Crea (Neomycin-Polymyxin-Hc) .... Apply To Ear Canal Twice Daily Until Pain Resolves. 9)  Flexeril 10 Mg Tabs (Cyclobenzaprine Hcl) .... Take 1 Tablet By Mouth Three Times A Day As Needed For Pain/spasms 10)  Fluocinonide 0.05 % Soln (Fluocinonide) .... Apply As Previously Done 11)  Pataday 0.2 % Soln (Olopatadine Hcl) .... Apply To Eye As Directed (As Previously Done) 12)  Red Yeast Rice 600 Mg Tabs (Red Yeast Rice Extract) .... Take 1 Tablet Twice Daily 13)  Amoxicillin 500 Mg Caps (Amoxicillin) .Marland Kitchen.. 1 Tab Twice Daily  For 7 Days  Allergies (verified): 1)  ! Codeine 2)  ! Pravachol (Pravastatin Sodium) 3)  Zocor  Past History:  Past Medical History: Last updated: 07/13/2006 Fibromyalgia Hyperlipidemia Low back pain Family hx DM Allergic rhinitis  Past Surgical History: Last updated: 07/13/2006 Hysterectomy  Family History: Last updated: 09/29/2008 Strong family history of CAD in first degree relatives.    Social History: Last updated: 04/12/2009 Never Smoked Alcohol use-no Drug use-no Regular exercise-yes Currently takes care of one child and occasionally her mother. Lives at home with daughter.   Risk Factors: Alcohol Use: 0 (11/10/2009) Exercise: yes (11/10/2009)  Risk  Factors: Smoking Status: never (11/10/2009)  Review of Systems      See HPI  Physical Exam  General:  Well-developed,well-nourished,in no acute distress; alert,appropriate and cooperative throughout examination Head:  Normocephalic and atraumatic without obvious abnormalities. Frontal and maxillary sinuses tender upon palpation. Neck:  mild, bilateral, non-tender, symmetrical LAD Lungs:  Normal respiratory effort, chest expands symmetrically. Lungs are clear to auscultation, no crackles or wheezes. Heart:  Normal rate and regular rhythm. S1 and S2 normal without gallop, murmur, click, rub or other extra sounds. Extremities:  trace left pedal edema and trace right pedal edema.   Neurologic:  alert & oriented X3.   Psych:  Cognition and judgment appear intact. Alert and cooperative with normal attention span and concentration. No apparent delusions, illusions, hallucinations   Impression & Recommendations:  Problem # 1:  EDEMA (ICD-782.3) I highly suspect that the edema she experiences in her hands and feet at night, primarily after she eats, is allergic in nature (her feet also itch at night). 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 I will check a Cmet and a urinalysis to rule out protein losses, and I will also refer her to an allergy specialist given that she hasn't seen one in 2 years. She wanted a "fluid pill" but given that her BP is 112/76, I do not feel it is necessary or appropriate to rx a diuretic at this time.  Orders: T-Urinalysis ([id number])  Problem # 2:  LYMPHADENOPATHY (ICD-785.6) Her submandibular LAN has been worked up in the past, with a negative CT done 5/10. She said this time, the LAD popped up 1 month ago, coinciding with worsening of her sinus infection symptoms. Although her nodes were not tender, I expect that they are either reactive or are simply  normal for her.   Her updated medication list for this problem includes:    Amoxicillin 500 Mg Caps (Amoxicillin) .Marland Kitchen... 1 tab twice daily for 7 days  Problem # 3:  ACUTE SINUSITIS, UNSPECIFIED (ICD-461.9) Given the longevity of her symptoms, subjective fever, and ? cervical LN enlargement, I will prescribe amoxicillin x 7 days.  Her updated medication list for this problem includes:    Amoxicillin 500 Mg Caps (Amoxicillin) .Marland Kitchen... 1 tab twice daily for 7 days  Problem # 4:  HYPERLIPIDEMIA (ICD-272.4) Patient does not need a repeat cholesterol check at this time. According to UpToDate, non-statins are NOT recommended for primary prevention. Given her intolerance to statins, has agreed to try red yeast rice (which is a "naturally" occurring statin). I will check a Cmet today to get baseline LFTs. If she is able to tolerate it, will recheck Cmet in 2-3 weeks and recheck cholesterol in a few months. If she is not able to tolerate  it, then could consider Zetia...but again, UpToDate recommended against any non-statin medical therapy for primary prevention.  Complete Medication List: 1)  Fish Oil Caps (Omega-3 fatty acids caps) .... Use as directed 2)  Proair Hfa 108 (90 Base) Mcg/act Aers (Albuterol sulfate) .... Inhale 2 puffs four times a day as needed 3)  Calcarb 600/d 600-125 Mg-unit Tabs (Calcium-vitamin d) .... Take one tablet by mouth three times a day with meals 4)  Anacin 81 Mg Tbec (Aspirin) .... Take one tablet by mouth daily 5)  Doxepin Hcl 25 Mg Caps (Doxepin hcl) .... Take one capsule by mouth at night 6)  Ibu 800 Mg Tabs (Ibuprofen) .... Take 1 tablet by mouth every eight hours with food as needed for pain. 7)  Hydrocortisone 2.5 % Lotn (Hydrocortisone) .... Apply to affected areas twice a day. 8)  Cortisporin 0.5-0.5-10000 Crea (Neomycin-polymyxin-hc) .... Apply to ear canal twice daily until pain resolves. 9)  Flexeril 10 Mg Tabs (Cyclobenzaprine hcl) .... Take 1 tablet by mouth three  times a day as needed for pain/spasms 10)  Fluocinonide 0.05 % Soln (Fluocinonide) .... Apply as previously done 11)  Pataday 0.2 % Soln (Olopatadine hcl) .... Apply to eye as directed (as previously done) 12)  Red Yeast Rice 600 Mg Tabs (Red yeast rice extract) .... Take 1 tablet twice daily 13)  Amoxicillin 500 Mg Caps (Amoxicillin) .Marland Kitchen.. 1 tab twice daily for 7 days  Other Orders: T-Comprehensive Metabolic Panel ([id number]) Allergy Referral  (Allergy) Dermatology Referral (Derma)  Patient Instructions: 1)  Please schedule a follow-up appointment in 3 weeks. 2)  I have prescribed two medications for you:  3)  Red Yeast Rice is a very mild medication that can help lower your cholesterol 4)  Amoxicillin is an antibiotic for your sinus infection. Please take this twice daily for 7 days. 5)  I have also referred you to an allergy doctor as well as a dermatologist. They will call you with an appointment. Prescriptions: RED YEAST RICE 600 MG TABS (RED YEAST RICE EXTRACT) take 1 tablet twice daily  #60 x 5   Entered and Authorized by:   Silvestre Gunner MD   Signed by:   Silvestre Gunner MD on 11/10/2009   Method used:   Electronically to        [facility] (retail)       [address]       [place], [place]  [id number]       Ph: [phone]       Fax: [phone]   RxID:   [id number] AMOXICILLIN 500 MG CAPS (AMOXICILLIN) 1 tab twice daily for 7 days  #14 x 0   Entered and Authorized by:   Silvestre Gunner MD   Signed by:   Silvestre Gunner MD on 11/10/2009   Method used:   Electronically to        [facility] (retail)       [address]       [place], [place]  [id number]       Ph: [phone]       Fax: [phone]   RxID:   [id number]   Prevention & Chronic Care Immunizations   Influenza vaccine: Fluvax Non-MCR  (06/11/2009)    Tetanus booster: Not documented    Pneumococcal vaccine: Not documented  Other Screening    Pap smear: Normal  (10/10/2002)   Pap smear action/deferral: Not indicated S/P hysterectomy  (03/30/2009)    Mammogram: ASSESSMENT:  Negative - BI-RADS 1^MM DIGITAL SCREENING  (10/07/2008)   Mammogram due: 10/07/2009   Smoking status: never  (11/10/2009)  Lipids   Total Cholesterol: 236  (08/18/2009)   LDL: 162  (08/18/2009)   LDL Direct: Not documented   HDL: 54  (08/18/2009)   Triglycerides: 98  (08/18/2009)   Lipid panel due: 09/30/2009    SGOT (AST): 15  (09/29/2008)   SGPT (ALT): 12  (09/29/2008) CMP ordered    Alkaline phosphatase: 73  (09/29/2008)   Total bilirubin: 0.4  (09/29/2008)  Self-Management Support :   Personal Goals (by the next clinic visit) :      Personal LDL goal: 100  (11/10/2009)    Patient will work on the following items until the next clinic visit to reach self-care goals:     Medications and monitoring: bring all of my medications to every visit  (11/10/2009)     Eating: eat more vegetables, use fresh or frozen vegetables, eat baked foods instead of fried foods  (11/10/2009)     Activity: take a 30 minute walk every day, take the stairs instead of the elevator  (11/10/2009)    Lipid self-management support: Education handout, Psychologist, forensic, Resources for patients handout, Written self-care plan  (11/10/2009)   Lipid self-care plan printed.   Lipid education handout printed      Resource handout printed.  Process Orders Check Orders Results:     [facility]: ABN not required for this insurance Tests Sent for requisitioning (November 10, 2009 9:55 AM):     11/10/2009: [facility] -- T-Comprehensive Metabolic Panel [[id number]] (signed)     11/10/2009: [facility] -- T-Urinalysis [[id number]] (signed)

## 2010-10-11 NOTE — Progress Notes (Signed)
Summary: phone note-medication/gp  Phone Note Call from Patient   Caller: Patient Call For: Cami Delawder MD Summary of Call: Pt. stated she went to [employer] and purchased [employer] Rice OTC and was told it should lower her cholesterol in 2 months.  She wants to know if she can schedule an appt. to check her cholesterol in 2 months instead of coming back in 3 weeks?   Initial call taken by: Chinita Pester RN,  November 10, 2009 4:19 PM  Follow-up for Phone Call        She'll need to get her liver function checked in 3-4 weeks. She can just make it a lab appointment if she'd prefer, and I (or another resident) can see her in 3 months. Follow-up by: Silvestre Gunner MD,  November 11, 2009 9:44 AM  Additional Follow-up for Phone Call Additional follow up Details #1::        I talked to pt.about liver fcn. test. She wants to know if she can get it done now.  She states  everytime she takes a ABX, she gets a yeast infection. And usually she takes 1 pill before  and I pill after the  ABX.  Additional Follow-up by: Chinita Pester RN,  November 11, 2009 12:27 PM    Additional Follow-up for Phone Call Additional follow up Details #2::    Does she want me to write a script for a yeast infection then?   As for the liver function tests, we did that yesterday and it was normal. The red yeast rice can cause transient liver damage in some patients, which is why I want to re-check her LFTs 3-4 weeks after she starts taking the medication.  I have prescribed her fluconazole per pt's request. Could you call the patient to let her know the rx is at her pharmacy? Thanks! Follow-up by: Silvestre Gunner MD,  November 11, 2009 1:43 PM  Additional Follow-up for Phone Call Additional follow up Details #3:: Details for Additional Follow-up Action Taken: Pt. does want Rx for yeast infection. I left message on pt.'s answering machine, her request, for the reason of repeat  liver fcn test in  3-4 wks per Dr. Tobie Lords. Also  about the RX. Additional Follow-up by: Chinita Pester RN,  November 11, 2009 3:01 PM  New/Updated Medications: FLUCONAZOLE 150 MG TABS (FLUCONAZOLE) take 1 tablet now and 1 tablet after finishing antibiotics Prescriptions: FLUCONAZOLE 150 MG TABS (FLUCONAZOLE) take 1 tablet now and 1 tablet after finishing antibiotics  #2 x 0   Entered and Authorized by:   Silvestre Gunner MD   Signed by:   Silvestre Gunner MD on 11/11/2009   Method used:   Electronically to        [facility] (retail)       [address]       [place], [place]  [id number]       Ph: [phone]       Fax: [phone]   RxID:   [id number]

## 2010-10-11 NOTE — Consult Note (Signed)
[facility]: [facility]   Imported By: Louretta Parma 06/06/2010 14:41:15  _____________________________________________________________________  External Attachment:    Type:   Image     Comment:   External Document

## 2010-10-11 NOTE — Assessment & Plan Note (Signed)
Summary: hands/wrists weak, wants referral/pcp-riofrio/[facility]la   Vital Signs:  Patient profile:   45 year old female Height:      62.5 inches (158.75 cm) Weight:      149.2 pounds (67.82 kg) BMI:     26.95 Temp:     97.0 degrees F (36.11 degrees C) oral Pulse rate:   83 / minute BP sitting:   120 / 82  (right arm) Cuff size:   regular  Vitals Entered By: Theotis Barrio NT II (December 02, 2009 8:48 AM) CC: CHRONIC BILATERAL WRIST PAIN  / # 8  / MEDICATION REFILL  Is Patient Diabetic? No Pain Assessment Patient in pain? yes     Location: WRISTS Intensity:    8 Type: ALL Onset of pain  Chronic Nutritional Status BMI of > 30 = obese  Have you ever been in a relationship where you felt threatened, hurt or afraid?No   Does patient need assistance? Functional Status Self care Ambulation Normal Comments CHRONIC BILATERAL WRITS PAIN    Primary Care Provider:  Manning Charity MD  CC:  CHRONIC BILATERAL WRIST PAIN  / # 8  / MEDICATION REFILL .  History of Present Illness: Briana Parker is a 45 year old Female with PMH/problems as outlined in the EMR, who presents to the [facility] with chief complaint(s) of:    1. bilateral wrist pain: this is a chronic problem, going on for years, remitting and relapsing. 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 Dr. Lyda Perone sent her to rehab, that did not seem to help much. She was to go to sports med, but couldn't keep appointment. Wants a new referral done today.   2. wants refills on ativan: she says she got ativan for chest pain (was told due to muscle spasm). Some time back it was switched to cyclobenzaprine, but she likes ativan better.   3. wants to have lab work done today.   Preventive Screening-Counseling & Management  Alcohol-Tobacco     Alcohol drinks/day: 0     Smoking Status: never  Caffeine-Diet-Exercise     Does Patient Exercise: yes     Type of exercise: WALKING     Times/week:  3  Current Medications (verified): 1)  Fish Oil  Caps (Omega-3 Fatty Acids Caps) .... Use As Directed 2)  Proair Hfa 108 (90 Base) Mcg/act Aers (Albuterol Sulfate) .... Inhale 2 Puffs Four Times A Day As Needed 3)  Calcarb 600/d 600-125 Mg-Unit Tabs (Calcium-Vitamin D) .... Take One Tablet By Mouth Three Times A Day With Meals 4)  Anacin 81 Mg  Tbec (Aspirin) .... Take One Tablet By Mouth Daily 5)  Doxepin Hcl 25 Mg Caps (Doxepin Hcl) .... Take One Capsule By Mouth At Night 6)  Ibu 800 Mg  Tabs (Ibuprofen) .... Take 1 Tablet By Mouth Every Eight Hours With Food As Needed For Pain. 7)  Hydrocortisone 2.5 % Lotn (Hydrocortisone) .... Apply To Affected Areas Twice A Day. 8)  Cortisporin 0.5-0.5-10000 Crea (Neomycin-Polymyxin-Hc) .... Apply To Ear Canal Twice Daily Until Pain Resolves. 9)  Flexeril 10 Mg Tabs (Cyclobenzaprine Hcl) .... Take 1 Tablet By Mouth Three Times A Day As Needed For Pain/spasms 10)  Fluocinonide 0.05 % Soln (Fluocinonide) .... Apply As Previously Done 11)  Pataday 0.2 % Soln (Olopatadine Hcl) .... Apply To Eye As Directed (As Previously Done) 12)  Red Yeast Rice 600 Mg Tabs (Red Yeast Rice Extract) .... Take 1 Tablet Twice  Daily  Allergies (verified): 1)  ! Codeine 2)  ! Pravachol (Pravastatin Sodium) 3)  Zocor  Past History:  Past Medical History: Last updated: 07/13/2006 Fibromyalgia Hyperlipidemia Low back pain Family hx DM Allergic rhinitis  Past Surgical History: Last updated: 07/13/2006 Hysterectomy  Family History: Last updated: 09/29/2008 Strong family history of CAD in first degree relatives.    Social History: Last updated: 04/12/2009 Never Smoked Alcohol use-no Drug use-no Regular exercise-yes Currently takes care of one child and occasionally her mother. Lives at home with daughter.   Risk Factors: Alcohol Use: 0 (12/02/2009) Exercise: yes (12/02/2009)  Risk Factors: Smoking Status: never (12/02/2009)  Review of Systems        Review of System: Negative except per HPI.    Physical Exam  General:  alert and well-developed.  not in distress.  Mouth:  pharynx pink and moist and no erythema.   Neck:  supple.   Lungs:  normal respiratory effort and normal breath sounds.   Heart:  normal rate and regular rhythm.   Abdomen:  soft and non-tender.   Msk:  bilateral wrist, mildly swollen and tender. No other joint swelling/ tenderness Pulses:  normal peripheral pulses  Extremities:  no cyanosis, clubbing or edema  Neurologic:  non focal.  Skin:  no rash Psych:  normally interactive.     Impression & Recommendations:  Problem # 1:  WRIST PAIN (ZHY-865.78) This is chronic problem with normal work up so far. Likely related to repititive stress at her work. She should benefit from Sports medicine referral. Continue with as needed pain meds for now.   Orders: Sports Medicine (Sports Med)  Problem # 2:  MUSCLE SPASM (ICD-728.85) She wants ativan refilled for this. I explained the potential side effects incl sedation and addiction. She would like to continue with flexeril.   Problem # 3:  HYPERLIPIDEMIA (ICD-272.4) on red yeast rice per Dr. Tobie Lords. LFT today.   Complete Medication List: 1)  Fish Oil Caps (Omega-3 fatty acids caps) .... Use as directed 2)  Proair Hfa 108 (90 Base) Mcg/act Aers (Albuterol sulfate) .... Inhale 2 puffs four times a day as needed 3)  Calcarb 600/d 600-125 Mg-unit Tabs (Calcium-vitamin d) .... Take one tablet by mouth three times a day with meals 4)  Anacin 81 Mg Tbec (Aspirin) .... Take one tablet by mouth daily 5)  Doxepin Hcl 25 Mg Caps (Doxepin hcl) .... Take one capsule by mouth at night 6)  Ibu 800 Mg Tabs (Ibuprofen) .... Take 1 tablet by mouth every eight hours with food as needed for pain. 7)  Hydrocortisone 2.5 % Lotn (Hydrocortisone) .... Apply to affected areas twice a day. 8)  Cortisporin 0.5-0.5-10000 Crea (Neomycin-polymyxin-hc) .... Apply to ear canal twice daily until  pain resolves. 9)  Flexeril 10 Mg Tabs (Cyclobenzaprine hcl) .... Take 1 tablet by mouth three times a day as needed for pain/spasms 10)  Fluocinonide 0.05 % Soln (Fluocinonide) .... Apply as previously done 11)  Pataday 0.2 % Soln (Olopatadine hcl) .... Apply to eye as directed (as previously done) 12)  Red Yeast Rice 600 Mg Tabs (Red yeast rice extract) .... Take 1 tablet twice daily 13)  Nasonex 50 Mcg/act Susp (Mometasone furoate) .... 2 sprays in each nostril daily 14)  Flexeril 10 Mg Tabs (Cyclobenzaprine hcl)  Patient Instructions: 1)  Please schedule a follow-up appointment in 1 month. 2)  Please keep up your appointment at the sports med.  Prescriptions: NASONEX 50 MCG/ACT SUSP (MOMETASONE FUROATE) 2 sprays  in each nostril daily  #1 bottle x 1   Entered and Authorized by:   Zara Council MD   Signed by:   Zara Council MD on 12/02/2009   Method used:   Electronically to        [facility] (retail)       [address]       [place], [place]  [id number]       Ph: [phone]       Fax: [phone]   RxID:   [id number]    Prevention & Chronic Care Immunizations   Influenza vaccine: Fluvax Non-MCR  (06/11/2009)    Tetanus booster: Not documented    Pneumococcal vaccine: Not documented  Other Screening   Pap smear: Normal  (10/10/2002)   Pap smear action/deferral: Not indicated S/P hysterectomy  (03/30/2009)    Mammogram: ASSESSMENT: Negative - BI-RADS 1^MM DIGITAL SCREENING  (10/07/2008)   Mammogram due: 10/07/2009   Smoking status: never  (12/02/2009)  Lipids   Total Cholesterol: 236  (08/18/2009)   LDL: 162  (08/18/2009)   LDL Direct: Not documented   HDL: 54  (08/18/2009)   Triglycerides: 98  (08/18/2009)   Lipid panel due: 09/30/2009    SGOT (AST): 14  (11/10/2009)   SGPT (ALT): 12  (11/10/2009)   Alkaline phosphatase: 62  (11/10/2009)   Total bilirubin: 0.4  (11/10/2009)    Lipid flowsheet reviewed?: Yes   Progress  toward LDL goal: Unchanged  Self-Management Support :   Personal Goals (by the next clinic visit) :      Personal LDL goal: 100  (11/10/2009)    Patient will work on the following items until the next clinic visit to reach self-care goals:     Medications and monitoring: take my medicines every day, bring all of my medications to every visit  (12/02/2009)     Eating: drink diet soda or water instead of juice or soda, eat more vegetables, use fresh or frozen vegetables, eat foods that are low in salt, eat baked foods instead of fried foods, eat fruit for snacks and desserts, limit or avoid alcohol  (12/02/2009)     Activity: take a 30 minute walk every day  (12/02/2009)    Lipid self-management support: Resources for patients handout  (12/02/2009)        Resource handout printed.

## 2010-10-11 NOTE — Letter (Signed)
[facility]   Imported By: Margie Billet 02/09/2010 14:09:26  _____________________________________________________________________  External Attachment:    Type:   Image     Comment:   External Document

## 2010-10-11 NOTE — Assessment & Plan Note (Signed)
Summary: flu shot/cfb  Nurse Visit   Allergies: 1)  ! Codeine 2)  ! Pravachol (Pravastatin Sodium) 3)  Zocor  Immunizations Administered:  Influenza Vaccine # 1:    Vaccine Type: Fluvax Non-MCR    Site: left deltoid    Mfr: GlaxoSmithK[employer]    Dose: 0.5 ml    Route: IM    Given by: Angelina Ok RN    Exp. Date: 03/11/2011    Lot #: [id number]    VIS given: 04/05/10 version given June 01, 2010.  Flu Vaccine Consent Questions:    Do you have a history of severe allergic reactions to this vaccine? no    Any prior history of allergic reactions to egg and/or gelatin? no    Do you have a sensitivity to the preservative Thimersol? no    Do you have a past history of Guillan-Barre Syndrome? no    Do you currently have an acute febrile illness? no    Have you ever had a severe reaction to latex? no    Vaccine information given and explained to patient? yes    Are you currently pregnant? no  Orders Added: 1)  Influenza Vaccine NON MCR [00028]

## 2010-10-11 NOTE — Progress Notes (Signed)
Summary: labs/ hla  Phone Note Call from Patient   Summary of Call: pt calls and would like for you to call her when you get her lab results back no matter what they are she wants you to call her with the exact values so she can tell the health food store and get new supplements from them, you may call her at [phone]. Initial call taken by: Marin Roberts RN,  Jan 11, 2010 3:00 PM  Follow-up for Phone Call        I apologize for not seeing this sooner. It's already been taken care of. Thanks again! Follow-up by: Silvestre Gunner MD,  Jan 14, 2010 5:42 PM

## 2010-10-11 NOTE — Assessment & Plan Note (Signed)
Summary: checkup,wrist pain,back pain,colesterol/pcp-riofrio/hla   Vital Signs:  Patient profile:   45 year old female Height:      62 inches Weight:      140.9 pounds BMI:     25.86 Temp:     98.8 degrees F oral Pulse rate:   94 / minute BP sitting:   113 / 79  (right arm)  Vitals Entered By: Filomena Jungling NT II (April 08, 2010 11:43 AM) CC: NEED REFILLS--[facility]/ NEEDS EYE APPOINTMENT/  SHOULDER HAND AND BACK PAINANDNECK, ? RHEUMATOLOGY APPOINTMENT Is Patient Diabetic? No Pain Assessment Patient in pain? yes     Location: lowerback, hands, shoulder Intensity: 8 Type: aching Onset of pain  Chronic Nutritional Status BMI of 25 - 29 = overweight  Have you ever been in a relationship where you felt threatened, hurt or afraid?No   Does patient need assistance? Functional Status Self care Ambulation Normal   Primary Care Provider:  Silvestre Gunner MD  CC:  NEED REFILLS--[facility]/ NEEDS EYE APPOINTMENT/  SHOULDER HAND AND BACK PAINANDNECK and ? RHEUMATOLOGY APPOINTMENT.  History of Present Illness: Pt is a 45 y/o woman with PMH/problems as outlined in EMR.  Pt comes to the clinic today with c/o   - Pain: wrist B/L, more on L, since about 3 yrs            neck pain and tightness, Back pain since about 3 yrs  She has wrist pain which is exacerbated by repeptitive movements and is releved by nothing. The pain is continuous and same all over the day. Some swelling present proximal to L wrist on Dorsal side.No trauma, redness.  Neck pain and tightness is exacerbated by movements and is also continuous. Denies any sensory changes or weakness, fever, headache, N/V, vision changes.  Back pain: xray at urgent care in 05/2009 had shown mild DDD at L4-5. She still has the pain but is not as severe as her wrist pain today. 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) 5)  Wrist Pain  (ICD-719.43) 6)  Hot Flashes  (ICD-627.2) 7)  Allergic Rhinitis  (ICD-477.9) 8)  Hyperlipidemia  (ICD-272.4) 9)  Gerd  (ICD-530.81) 10)  Edema  (ICD-782.3) 11)  Sx of Chest Pain  (ICD-786.50) 12)  Acute Sinusitis, Unspecified  (ICD-461.9) 13)  Lymphadenopathy  (ICD-785.6) 14)  Abdominal Distension  (ICD-787.3) 15)  Insomnia  (ICD-780.52) 16)  Peripheral Neuropathy  (ICD-356.9) 17)  Palpitations  (ICD-785.1) 18)  Neck Pain, Right  (ICD-723.1) 19)  Sexual Activity, High Risk  (ICD-V69.2) 20)  Contact Dermatitis  (ICD-692.9) 21)  Hemorrhoids  (ICD-455.6) 22)  Low Back Pain  (ICD-724.2)  Medications Prior to Update: 1)  Fish Oil  Caps (Omega-3 Fatty Acids Caps) .... Use As Directed 2)  Proair Hfa 108 (90 Base) Mcg/act Aers (Albuterol Sulfate) .... Inhale 2 Puffs Four Times A Day As Needed 3)  Calcarb 600/d 600-125 Mg-Unit Tabs (Calcium-Vitamin D) .... Take One Tablet By Mouth Three Times A  Day With Meals 4)  Anacin 81 Mg  Tbec (Aspirin) .... Take One Tablet By Mouth Daily 5)  Doxepin Hcl 25 Mg Caps (Doxepin Hcl) .... Take One Capsule By Mouth At Night 6)  Ibu 800 Mg  Tabs (Ibuprofen) .... Take 1 Tablet By Mouth Every Eight Hours With Food As Needed For Pain. 7)  Cortisporin 0.5-0.5-10000 Crea (Neomycin-Polymyxin-Hc) .... Apply To Ear Canal Twice Daily Until Pain Resolves. 8)  Flexeril 10 Mg Tabs (Cyclobenzaprine Hcl) .... Take 1 Tablet By Mouth Three Times A Day As Needed For Pain/spasms 9)  Fluocinonide 0.05 % Soln (Fluocinonide) .... Apply As Previously Done 10)  Pataday 0.2 % Soln (Olopatadine Hcl) .... Apply To Eye  As Directed (As Previously Done) 11)  Red Yeast Rice 600 Mg Tabs (Red Yeast Rice Extract) .... Take 1 Tablet Twice Daily 12)  Nasonex 50 Mcg/act Susp (Mometasone Furoate) .... 2 Sprays in Each Nostril Daily 13)  Voltaren 1 %  Gel (Diclofenac Sodium) .... Apply 4 Times Daily As Needed Wrist Pain (Failure, Multiple Oral Nsaids) 14)  Omeprazole 40 Mg Cpdr (Omeprazole) .... Take 1 Tablet By Mouth Daily. 15)  Senna-Docusate Sodium 8.6-50 Mg Tabs (Sennosides-Docusate Sodium) .... Take 1 Tablet Every 12 Hour For Constipation As Needed.  Current Medications (verified): 1)  Fish Oil  Caps (Omega-3 Fatty Acids Caps) .... Use As Directed 2)  Proair Hfa 108 (90 Base) Mcg/act Aers (Albuterol Sulfate) .... Inhale 2 Puffs Four Times A Day As Needed 3)  Calcarb 600/d 600-125 Mg-Unit Tabs (Calcium-Vitamin D) .... Take One Tablet By Mouth Three Times A Day With Meals 4)  Anacin 81 Mg  Tbec (Aspirin) .... Take One Tablet By Mouth Daily 5)  Doxepin Hcl 25 Mg Caps (Doxepin Hcl) .... Take One Capsule By Mouth At Night 6)  Cortisporin 0.5-0.5-10000 Crea (Neomycin-Polymyxin-Hc) .... Apply To Ear Canal Twice Daily Until Pain Resolves. 7)  Flexeril 10 Mg Tabs (Cyclobenzaprine Hcl) .... Take 1 Tablet By Mouth Three Times A Day As Needed For Pain/spasms 8)  Fluocinonide 0.05 % Soln (Fluocinonide) .... Apply As Previously Done 9)  Pataday 0.2 % Soln (Olopatadine Hcl) .... Apply To Eye As Directed (As Previously Done) 10)  Red Yeast Rice 600 Mg Tabs (Red Yeast Rice Extract) .... Take 1 Tablet Twice Daily 11)  Nasonex 50 Mcg/act Susp (Mometasone Furoate) .... 2 Sprays in Each Nostril Daily 12)  Voltaren 1 %  Gel (Diclofenac Sodium) .... Apply 4 Times Daily As Needed Wrist Pain (Failure, Multiple Oral Nsaids) 13)  Omeprazole 40 Mg Cpdr (Omeprazole) .... Take 1 Tablet By Mouth Daily. 14)  Senna-Docusate Sodium 8.6-50 Mg Tabs (Sennosides-Docusate Sodium) .... Take 1 Tablet Every 12 Hour For Constipation As Needed.  Allergies  (verified): 1)  ! Codeine 2)  ! Pravachol (Pravastatin Sodium) 3)  Zocor  Social History: Never Smoked Alcohol use-no Drug use-no Regular exercise-yes Currently takes care of one child. Her mother died recently in Feb 06, 2010 due to 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 She was on dialysis and died with cardiac arrest as per pt. Lives at home with daughter.   Review of Systems       as per HPI.Marland Kitchen  Physical Exam  General:  alert, well-developed, and well-nourished.   Head:  normocephalic and atraumatic.   Eyes:  vision grossly intact, pupils equal, and pupils round.   no optic disk abnormalities. Ears:  no external deformities.   Neck:  supple.  Pain with ROM Lungs:  normal respiratory effort, no intercostal retractions, no accessory muscle  use, and normal breath sounds.   Heart:  normal rate and regular rhythm.   Abdomen:  soft, non-tender, normal bowel sounds, no distention, no masses, no guarding, no rigidity, no rebound tenderness, and no abdominal hernia.   Msk:  Swelling proximal to L wrist joint dorsally. Tenderness to palpation at wrist B/L and lower lumbar spine. Pulses:  normal peripheral pulses  Extremities:  no edema Neurologic:  non focal. alert & oriented X3.     Impression & Recommendations:  Problem # 1:  WRIST PAIN, BILATERAL (ICD-719.43) Pt c/o wrist pain as per HPI. Also c/o B/L cheek redness and swelling in spring. She also has hiatal hernia w/ erosive gastritis as per her recent UGI endo. Considering all these and her pain at many sites, will check for Autoimmune disorder today, may be SLE, RA, or any other. Also made an rheumatology referral for her. Will reassess pt in 2 weeks with lab results. Orders: T-Sed Rate (Automated) [id number]) T-C-Reactive Protein [id number]) T-Comprehensive Metabolic Panel [id number]) T- * Misc. Laboratory test [id number]) Rheumatology Referral (Rheumatology)  Problem # 2:  HYPERLIPIDEMIA (ICD-272.4) She acme fasting today for her FLP, so  did one today and will f/u with her as per her lab results. Orders: T-Lipid Profile [id number])  Labs Reviewed: SGOT: 12 (01/12/2010)   SGPT: 12 (01/12/2010)   HDL:47 (01/12/2010), 54 (08/18/2009)  LDL:123 (01/12/2010), 162 (08/18/2009)  Chol:187 (01/12/2010), 236 (08/18/2009)  Trig:87 (01/12/2010), 98 (08/18/2009)  Problem # 3:  LOW BACK PAIN (ICD-724.2) Its still the same as before, but wrist is more painful today. Will reassess at next visit The following medications were removed from the medication list:    Ibu 800 Mg Tabs (Ibuprofen) .Marland Kitchen... Take 1 tablet by mouth every eight hours with food as needed for pain. Her updated medication list for this problem includes:    Anacin 81 Mg Tbec (Aspirin) .Marland Kitchen... Take one tablet by mouth daily    Flexeril 10 Mg Tabs (Cyclobenzaprine hcl) .Marland Kitchen... Take 1 tablet by mouth three times a day as needed for pain/spasms    Apap 325 Mg Tabs (Acetaminophen) .Marland Kitchen... 2 tabs four times a day as needed for pain  Orders: Rheumatology Referral (Rheumatology)  Complete Medication List: 1)  Fish Oil Caps (Omega-3 fatty acids caps) .... Use as directed 2)  Proair Hfa 108 (90 Base) Mcg/act Aers (Albuterol sulfate) .... Inhale 2 puffs four times a day as needed 3)  Calcarb 600/d 600-125 Mg-unit Tabs (Calcium-vitamin d) .... Take one tablet by mouth three times a day with meals 4)  Anacin 81 Mg Tbec (Aspirin) .... Take one tablet by mouth daily 5)  Doxepin Hcl 25 Mg Caps (Doxepin hcl) .... Take one capsule by mouth at night 6)  Cortisporin 0.5-0.5-10000 Crea (Neomycin-polymyxin-hc) .... Apply to ear canal twice daily until pain resolves. 7)  Flexeril 10 Mg Tabs (Cyclobenzaprine hcl) .... Take 1 tablet by mouth three times a day as needed for pain/spasms 8)  Fluocinonide 0.05 % Soln (Fluocinonide) .... Apply as previously done 9)  Pataday 0.2 % Soln (Olopatadine hcl) .... Apply to eye as directed (as previously done) 10)  Red Yeast Rice 600 Mg Tabs (Red yeast rice  extract) .... Take 1 tablet twice daily 11)  Nasonex 50 Mcg/act Susp (Mometasone furoate) .... 2 sprays in each nostril daily 12)  Voltaren 1 % Gel (Diclofenac sodium) .... Apply 4 times daily as needed wrist pain (failure, multiple oral nsaids) 13)  Omeprazole 40 Mg Cpdr (Omeprazole) .... Take 1 tablet by mouth  daily. 14)  Senna-docusate Sodium 8.6-50 Mg Tabs (Sennosides-docusate sodium) .... Take 1 tablet every 12 hour for constipation as needed. 15)  Apap 325 Mg Tabs (Acetaminophen) .... 2 tabs four times a day as needed for pain 16)  Hydrocortisone Butyrate 0.1 % Crea (Hydrocortisone butyrate) .... Apply on your skin two times a day as needed  Patient Instructions: 1)  Please schedule a follow-up appointment in 2 weeks for results of tests. 2)  We are giving a Rheatology referral to you today to see a doctor for your joint pains. 3)  Also take tylenol 650 mg four times a day as needed for your pain. Prescriptions: FLEXERIL 10 MG TABS (CYCLOBENZAPRINE HCL) Take 1 tablet by mouth three times a day as needed for pain/spasms  #60 x 3   Entered and Authorized by:   Lyn Hollingshead   Signed by:   Lyn Hollingshead on 04/08/2010   Method used:   Electronically to        [facility] [address] (retail)       [address]       [place], [place]  [address]       Ph: [phone]       Fax: [phone]   RxID:   [id number] APAP 325 MG TABS (ACETAMINOPHEN) 2 tabs four times a day as needed for pain  #120 x 3   Entered and Authorized by:   Lyn Hollingshead   Signed by:   Lyn Hollingshead on 04/08/2010   Method used:   Electronically to        [facility] [facility] (retail)       [address]       [place], [place]  [id number]       Ph: [phone]       Fax: [phone]   RxID:   [id number] OMEPRAZOLE 40 MG CPDR (OMEPRAZOLE) Take 1 tablet by mouth daily.  #30 x 3   Entered and Authorized by:   Lyn Hollingshead   Signed by:   Lyn Hollingshead on 04/08/2010   Method used:    Electronically to        [facility] (retail)       [address]       [place], [place]  [id number]       Ph: [phone]       Fax: [phone]   RxID:   [id number] HYDROCORTISONE BUTYRATE 0.1 % CREA (HYDROCORTISONE BUTYRATE) apply on your skin two times a day as needed  #25 x 1   Entered and Authorized by:   Lyn Hollingshead   Signed by:   Lyn Hollingshead on 04/08/2010   Method used:   Electronically to        [facility] [address] (retail)       [address]       [place], [place]  [address]       Ph: [phone]       Fax: [phone]   RxID:   [id number] NASONEX 50 MCG/ACT SUSP (MOMETASONE FUROATE) 2 sprays in each nostril daily  #1 bottle x 1   Entered and Authorized by:   Lyn Hollingshead   Signed by:   Lyn Hollingshead on 04/08/2010   Method used:   Electronically to        [facility] [address] (retail)       [address]       [place],   [id number]       Ph: [phone]       Fax: [phone]   RxID:   [id number] DOXEPIN HCL 25 MG CAPS (DOXEPIN HCL) Take one capsule by mouth at night  #31 x 12   Entered and Authorized by:   Lyn Hollingshead   Signed by:   Lyn Hollingshead on 04/08/2010   Method used:   Electronically to        [facility] [address] (retail)       [address]       [place], [place]  [address]       Ph: [phone]       Fax: [phone]   RxID:   [id number]   Prevention & Chronic Care Immunizations   Influenza vaccine: Fluvax Non-MCR  (06/11/2009)    Tetanus booster: Not documented    Pneumococcal vaccine: Not documented  Other Screening   Pap smear: Normal  (10/10/2002)   Pap smear action/deferral: Not indicated S/P hysterectomy  (03/30/2009)    Mammogram: ASSESSMENT: Negative - BI-RADS 1^MM DIGITAL SCREENING  (01/04/2010)   Mammogram due: 10/07/2009   Smoking status: never  (04/08/2010)  Lipids   Total Cholesterol: 187  (01/12/2010)   Lipid panel  action/deferral: Lipid Panel ordered   LDL: 123  (01/12/2010)   LDL Direct: Not documented   HDL: 47  (01/12/2010)   Triglycerides: 87  (01/12/2010)   Lipid panel due: 09/30/2009    SGOT (AST): 12  (01/12/2010)   SGPT (ALT): 12  (01/12/2010) CMP ordered    Alkaline phosphatase: 73  (01/12/2010)   Total bilirubin: 0.4  (01/12/2010)    Lipid flowsheet reviewed?: Yes   Progress toward LDL goal: Unchanged  Self-Management Support :   Personal Goals (by the next clinic visit) :      Personal LDL goal: 100  (11/10/2009)    Patient will work on the following items until the next clinic visit to reach self-care goals:     Medications and monitoring: take my medicines every day  (04/08/2010)     Eating: drink diet soda or water instead of juice or soda, eat more vegetables, use fresh or frozen vegetables, eat foods that are low in salt, eat baked foods instead of fried foods, eat fruit for snacks and desserts  (04/08/2010)     Activity: take a 30 minute walk every day  (01/19/2010)    Lipid self-management support: Education handout, Resources for patients handout  (04/08/2010)     Lipid education handout printed      Resource handout printed.   Process Orders Check Orders Results:     [facility]: ABN not required for this insurance Tests Sent for requisitioning (April 11, 2010 4:36 PM):     04/08/2010: [facility] -- T-Lipid Profile [phone] (signed)     04/08/2010: [facility] -- T-Sed Rate (Automated) [phone] (signed)     04/08/2010: [facility] -- T-C-Reactive Protein [phone] (signed)     04/08/2010: [facility] -- T-Comprehensive Metabolic Panel [[id number]] (signed)     04/08/2010: [facility] -- T- * Misc. Laboratory test [phone] (signed)

## 2010-10-11 NOTE — Assessment & Plan Note (Signed)
Primary Care Provider:  Almyra Deforest MD   History of Present Illness: 56 pt with pmh outlined below.  Here complaining of persistent abdominal pain, being followed by GI, recent EGD with biopsy showing erosive gastritis, negative for H.pylori. Pt apparently believes she has something in her stomach and is demanding antibiotics to get rid of it. I explained to her that her EGD shows inflammatory changes and that the Omeprazole is the best treatment for this in terms of protecting her stomach lining but the patient got defensive and demanded to know my age instead, insisting that there was something in her stomach that needed to be flushed out. I explained to patient that her interaction was unprofessional and inappropriate.  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  She also complains of right flank pain, says she cannot remember when this started, but states it may have been a few weeks. She denies any hematuria, dysuria, polyuria, fevers, chills, n/v or suprapubic pain. Urine dipstick as follows - negative blood, nitrite, leukocyte.   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) 6)  Wrist Pain  (ICD-719.43) 7)  Hot Flashes  (ICD-627.2) 8)  Allergic Rhinitis  (ICD-477.9) 9)  Hyperlipidemia  (ICD-272.4) 10)  Gerd  (ICD-530.81) 11)  Edema  (ICD-782.3) 12)  Sx of Chest Pain  (ICD-786.50) 13)  Acute Sinusitis, Unspecified  (ICD-461.9) 14)  Lymphadenopathy  (ICD-785.6) 15)  Abdominal Distension  (ICD-787.3) 16)  Insomnia  (ICD-780.52) 17)  Peripheral Neuropathy  (ICD-356.9) 18)  Palpitations  (ICD-785.1) 19)  Neck Pain, Right  (ICD-723.1) 20)  Sexual  Activity, High Risk  (ICD-V69.2) 21)  Contact Dermatitis  (ICD-692.9) 22)  Hemorrhoids  (ICD-455.6) 23)  Low Back Pain  (ICD-724.2)  Current Medications (verified): 1)  Fish Oil  Caps (Omega-3 Fatty Acids Caps) .... Use As Directed 2)  Proair Hfa 108 (90 Base) Mcg/act Aers (Albuterol Sulfate) .... Inhale 2 Puffs Four Times A Day As Needed 3)  Calcarb 600/d 600-125 Mg-Unit Tabs (Calcium-Vitamin D) .... Take One Tablet By Mouth Three Times A Day With Meals 4)  Anacin 81 Mg  Tbec (Aspirin) .... Take One Tablet By Mouth Daily 5)  Doxepin Hcl 25 Mg Caps (Doxepin Hcl) .... Take One Capsule By Mouth At Night 6)  Cortisporin 0.5-0.5-10000 Crea (Neomycin-Polymyxin-Hc) .... Apply To Ear Canal Twice Daily Until Pain Resolves. 7)  Flexeril 10 Mg Tabs (Cyclobenzaprine Hcl) .... Take 1 Tablet By Mouth Three Times A Day As Needed For Pain/spasms 8)  Fluocinonide 0.05 % Soln (Fluocinonide) .... Apply As Previously Done 9)  Pataday 0.2 % Soln (Olopatadine Hcl) .... Apply To Eye As Directed (As Previously Done) 10)  Red Yeast Rice 600 Mg Tabs (Red Yeast Rice Extract) .... Take 1 Tablet Twice Daily 11)  Nasonex 50 Mcg/act Susp (Mometasone Furoate) .... 2 Sprays in Each Nostril Daily 12)  Voltaren 1 %  Gel (Diclofenac Sodium) .... Apply 4 Times Daily As Needed Wrist Pain (Failure, Multiple Oral Nsaids) 13)  Omeprazole 40 Mg Cpdr (Omeprazole) .... Take 1 Tablet By Mouth Daily. 14)  Senna-Docusate Sodium 8.6-50 Mg Tabs (Sennosides-Docusate Sodium) .... Take 1 Tablet Every 12 Hour For Constipation As Needed. 15)  Apap  325 Mg Tabs (Acetaminophen) .... 2 Tabs Four Times A Day As Needed For Pain 16)  Hydrocortisone Butyrate 0.1 % Crea (Hydrocortisone Butyrate) .... Apply On Your Skin Two Times A Day As Needed  Allergies (verified): 1)  ! Codeine 2)  ! Pravachol 3)  Zocor  Past History:  Past Medical History: Last updated: 07/13/2006 Fibromyalgia Hyperlipidemia Low back pain Family hx DM Allergic  rhinitis  Past Surgical History: Last updated: 07/13/2006 Hysterectomy  Family History: Last updated: 09/29/2008 Strong family history of CAD in first degree relatives.    Social History: Last updated: 04/08/2010 Never Smoked Alcohol use-no Drug use-no Regular exercise-yes Currently takes care of one child. Her mother died recently in 2010-02-13 due to 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 She was on dialysis and died with cardiac arrest as per pt. Lives at home with daughter.   Risk Factors: Alcohol Use: 0 (08/18/2010) Exercise: yes (08/18/2010)  Risk Factors: Smoking Status: never (08/18/2010)  Review of Systems      See HPI  Physical Exam  General:  alert.  alert.   Head:  normocephalic and atraumatic.  normocephalic and atraumatic.   Lungs:  normal respiratory effort, no intercostal retractions, no accessory muscle use, and normal breath sounds.   Heart:  normal rate and regular rhythm.   Abdomen:  soft, non-tender, normal bowel sounds, no distention, no masses, no guarding, no rigidity, no rebound tenderness, and no abdominal hernia.   Msk:  back -no paraspinal tenderness, no muscle spasmnormal ROM.  normal ROM.   Pulses:  normal peripheral pulses Extremities:  no edema Neurologic:  alert & oriented X3.  alert & oriented X3.   Psych:  very inappropriate   Impression & Recommendations:  Problem # 1:  GERD (ICD-530.81) Continue Omeprazole.   Her updated medication list for this problem includes:    Omeprazole 40 Mg Cpdr (Omeprazole) .Marland Kitchen... Take 1 tablet by mouth daily.  Problem # 2:  HYPERLIPIDEMIA (ICD-272.4)  Wants to recheck her lipid profile. May consider starting Crestor if LDL is persistently elevated, considering her allergies. May also consider Lipitor.  Follow lipid panel.  Labs Reviewed: SGOT: 19 (04/08/2010)   SGPT: 15 (04/08/2010)   HDL:56 (04/08/2010), 47 (01/12/2010)  LDL:155 (04/08/2010), 123 (01/12/2010)  Chol:233 (04/08/2010), 187 (01/12/2010)  Trig:109  (04/08/2010), 87 (01/12/2010)  Problem # 3:  LOW BACK PAIN (ICD-724.2) Instructed to take Tylenol 1000mg  every 4 hours for one week and then to change to as needed basis.  No CVA tenderness on exam, negative urine dipstick. Unlikely that she has a UTI.  Her updated medication list for this problem includes:    Anacin 81 Mg Tbec (Aspirin) .Marland Kitchen... Take one tablet by mouth daily    Flexeril 10 Mg Tabs (Cyclobenzaprine hcl) .Marland Kitchen... Take 1 tablet by mouth three times a day as needed for pain/spasms    Apap 325 Mg Tabs (Acetaminophen) .Marland Kitchen... 2 tabs four times a day as needed for pain  Orders: T-Urinalysis Dipstick only (16109UE)  Complete Medication List: 1)  Fish Oil Caps (Omega-3 fatty acids caps) .... Use as directed 2)  Proair Hfa 108 (90 Base) Mcg/act Aers (Albuterol sulfate) .... Inhale 2 puffs four times a day as needed 3)  Calcarb 600/d 600-125 Mg-unit Tabs (Calcium-vitamin d) .... Take one tablet by mouth three times a day with meals 4)  Anacin 81 Mg Tbec (Aspirin) .... Take one tablet by mouth daily 5)  Doxepin Hcl 25 Mg Caps (Doxepin hcl) .... Take one capsule by mouth at night 6)  Cortisporin 0.5-0.5-10000 Crea (Neomycin-polymyxin-hc) .... Apply to ear canal twice daily until pain resolves. 7)  Flexeril 10 Mg Tabs (Cyclobenzaprine hcl) .... Take 1 tablet by mouth three times a day as needed for pain/spasms 8)  Fluocinonide 0.05 % Soln (Fluocinonide) .... Apply as previously done 9)  Pataday 0.2 % Soln (Olopatadine hcl) .... Apply to eye as directed (as previously done) 10)  Red Yeast Rice 600 Mg Tabs (Red yeast rice extract) .... Take 1 tablet twice daily 11)  Nasonex 50 Mcg/act Susp (Mometasone furoate) .... 2 sprays in each nostril daily 12)  Voltaren 1 % Gel (Diclofenac sodium) .... Apply 4 times daily as needed wrist pain (failure, multiple oral nsaids) 13)  Omeprazole 40 Mg Cpdr (Omeprazole) .... Take 1 tablet by mouth daily. 14)  Senna-docusate Sodium 8.6-50 Mg Tabs  (Sennosides-docusate sodium) .... Take 1 tablet every 12 hour for constipation as needed. 15)  Apap 325 Mg Tabs (Acetaminophen) .... 2 tabs four times a day as needed for pain 16)  Hydrocortisone Butyrate 0.1 % Crea (Hydrocortisone butyrate) .... Apply on your skin two times a day as needed  Patient Instructions: 1)  For your back pain, take Tylenol 1000mg , one tablet every 4 hours for one week, then only as needed. If the pills come in 500mg  tablets, then take two tablets every four hours for one week then as needed. 2)  Follow up with your PCP in 3 months. 3)    4)  Please schedule a follow-up appointment in 3 months.   Orders Added: 1)  T-Urinalysis Dipstick only [81003QW] 2)  Est. Patient Level III [16109]     Laboratory Results   Urine Tests  Date/Time Recieved: 08/18/10 2:40PM Date/Time Reported: 08/18/10  2:40PM  Routine Urinalysis   Color: lt. yellow Glucose: negative   (Normal Range: Negative) Bilirubin: negative   (Normal Range: Negative) Ketone: trace (5)   (Normal Range: Negative) Spec. Gravity: 1.025   (Normal Range: 1.003-1.035) Blood: negative   (Normal Range: Negative) pH: 5.5   (Normal Range: 5.0-8.0) Protein: negative   (Normal Range: Negative) Urobilinogen: 0.2   (Normal Range: 0-1) Nitrite: negative   (Normal Range: Negative) Leukocyte Esterace: negative   (Normal Range: Negative)

## 2010-10-11 NOTE — Assessment & Plan Note (Signed)
Summary: [facility] PT - BILATERAL WRIST PAIN   Vital Signs:  Patient profile:   45 year old female Height:      62 inches Weight:      142 pounds BP sitting:   128 / 79  Vitals Entered By: Lillia Pauls CMA (December 07, 2009 9:03 AM)  Primary Care Provider:  Manning Charity MD   History of Present Illness: 45 year old:  Intermittently swelling, has been having them swell dorsally for a long time. When touches sometime-- if touches a cold, feels like there will be a burning sensation that she feels "deep".  When writes, will give out.  Also cleans and will give out. 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 Went to rehab, did not help - hurt more.  Lifting things and with lifting one year old, will hurt.   Hands have gotten significantly swollen and has some wrist and MCP swelling. No trauma or injury.  No FH of rheum disease. No xrays done.    Allergies: 1)  ! Codeine 2)  ! Pravachol (Pravastatin Sodium) 3)  Zocor  Past History:  Past medical, surgical, family and social histories (including risk factors) reviewed, and no changes noted (except as noted below).  Past Medical History: Reviewed history from 07/13/2006 and no changes required. Fibromyalgia Hyperlipidemia Low back pain Family hx DM Allergic rhinitis  Past Surgical History: Reviewed history from 07/13/2006 and no changes required. Hysterectomy  Family History: Reviewed history from 09/29/2008 and no changes required. Strong family history of CAD in first degree relatives.    Social History: Reviewed history from 04/12/2009 and no changes required. Never Smoked Alcohol use-no Drug use-no Regular exercise-yes Currently takes care of one child and occasionally her mother. Lives at home with daughter.   Review of Systems       REVIEW OF SYSTEMS  GEN: No systemic complaints, no fevers, chills, sweats, or other acute illnesses MSK: Detailed in the HPI GI: tolerating PO intake without  difficulty Neuro: No numbness, parasthesias, or tingling associated. Otherwise, the pertinent positives and negatives are listed above and in the HPI, otherwise a full review of systems has been reviewed and is negative unless noted positive.   Physical Exam  General:  GEN: Well-developed,well-nourished,in no acute distress; alert,appropriate and cooperative throughout examination HEENT: Normocephalic and atraumatic without obvious abnormalities. No apparent alopecia or balding. Ears, externally no deformities PULM: Breathing comfortably in no respiratory distress EXT: No clubbing, cyanosis, or edema PSYCH: Normally interactive. Cooperative during the interview. Pleasant. Friendly and conversant. Not anxious or depressed appearing. Normal, full affect.  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 I am not convinced there is not underlying rheum disease. Check ESR, CRP, anti-CCP, RF, ANA.  B wrist xrays to assess uric acid  certainly dorsal synovitis, use Voltaren gel 4 times daily  placed into cock-up wrist splints intermittently with activity at home and sleep  cc: Dr. Tobie Lords  Orders: Radiology other (Radiology Other)Future Orders: Miscellaneous  Lab Charge-[facility] [phone]) ... 11/10/2011  Problem # 2:  ARTHRALGIA, BOTH WRISTS (ICD-719.43)  Complete Medication List: 1)  Fish Oil Caps (Omega-3 fatty acids caps) ....  Use as directed 2)  Proair Hfa 108 (90 Base) Mcg/act Aers (Albuterol sulfate) .... Inhale 2 puffs four times a day as needed 3)  Calcarb 600/d 600-125 Mg-unit Tabs (Calcium-vitamin d) .... Take one tablet by mouth three times a day with meals 4)  Anacin 81 Mg Tbec (Aspirin) .... Take one tablet by mouth daily 5)  Doxepin Hcl 25 Mg Caps (Doxepin hcl) .... Take one capsule by mouth at night 6)  Ibu 800 Mg Tabs (Ibuprofen) .... Take 1 tablet by mouth every eight hours with food as needed for pain. 7)  Hydrocortisone 2.5 % Lotn (Hydrocortisone) .... Apply to affected areas twice a day. 8)  Cortisporin 0.5-0.5-10000 Crea (Neomycin-polymyxin-hc) .... Apply to ear canal twice daily until pain resolves. 9)  Flexeril 10 Mg Tabs (Cyclobenzaprine hcl) .... Take 1 tablet by mouth three times a day as needed for pain/spasms 10)  Fluocinonide 0.05 % Soln (Fluocinonide) .... Apply as previously done 11)  Pataday 0.2 % Soln (Olopatadine hcl) .... Apply to eye as directed (as previously done) 12)  Red Yeast Rice 600 Mg Tabs (Red yeast rice extract) .... Take 1 tablet twice daily 13)  Nasonex 50 Mcg/act Susp (Mometasone furoate) .... 2 sprays in each nostril daily 14)  Flexeril 10 Mg Tabs (Cyclobenzaprine hcl) 15)  Voltaren 1 % Gel (Diclofenac sodium) .... Apply 4 times daily as needed wrist pain (failure, multiple oral nsaids)  Prescriptions: VOLTAREN 1 %  GEL (DICLOFENAC SODIUM) Apply 4 times daily as needed wrist pain (failure, multiple oral NSAIDS)  #5 tubes x 11   Entered and Authorized by:   Hannah Beat MD   Signed by:   Hannah Beat MD on 12/07/2009   Method used:   Print then Give to Patient   RxID:   [id number]   Appended Document: [facility] - BILATERAL WRIST PAIN

## 2010-10-11 NOTE — Assessment & Plan Note (Signed)
Summary: F/U,[facility]   Vital Signs:  Patient profile:   45 year old female BP sitting:   112 / 76  Vitals Entered By: Lillia Pauls CMA (January 04, 2010 9:19 AM)  Primary Care Provider:  Manning Charity MD   History of Present Illness: 45 year old f/u of B wrist pain:  Has been wearing cock up wrist splints intermittently, sometimes at night.  c/o from prior office visit continue. Intermittently swelling, has been having them swell dorsally for a long time. When touches sometime-- if touches a cold, feels like there will be a burning sensation that she feels "deep".  Also cleans and will give out. 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 Went to rehab, has hand str exercises  Hands have gotten significantly swollen and has some wrist and MCP swelling. No trauma or injury.  B Hand XR independently reviewed. Mild OA changes, no changes c/w RA, CPPD, or gout  All labs reviewed. Grossly all negative.  Tests: (1) Uric Acid (45409)   Uric Acid                 5.3 mg/dL                   8.1-1.9  Tests: (2) CRP (C-Reactive Protein) (14782)  CRP (C-Reactive Protein)                        [H]  0.6 mg/dL                   <9.5  Tests: (3) Rheumatoid (RA) Factor (62130)  Rheumatoid (RA) Factor                             < 20 IU/mL                  0-20  Tests: (4) Anti Nuclear Antibody (ANA) Reflex (23900)  Anti Nuclear Antibody (ANA)                        [A]  POS                         NEGATIVE  Tests: (5) ANA Titer and Pattern (86578)   ANA Titer                 NEG                         <1:40           Reference Ranges:     1:40 - 1:80 Weakly positive, usually not clinically significant.     > or = to 1:160 Result may be clinically significant.     Due to differences in methodologies, results may differ between the     ANA screen and the Reflex IFA titer and pattern.   ANA Pattern               TNP  Tests: (6) Cyclic Citrullinated Peptide Ab,IgG (82255)  Cyclic  Citrul Pep Ab, IgG                             0.4 U/mL                    <7  Allergies: 1)  ! Codeine 2)  ! Pravachol (Pravastatin Sodium) 3)  Zocor  Past History:  Past medical, surgical, family and social histories (including risk factors) reviewed, and no changes noted (except as noted below).  Past Medical History: Reviewed history from 07/13/2006 and no changes required. Fibromyalgia Hyperlipidemia Low back pain Family hx DM Allergic rhinitis  Past Surgical History: Reviewed history from 07/13/2006 and no changes required. Hysterectomy  Family History: Reviewed history from 09/29/2008 and no changes required. Strong family history of CAD in first degree relatives.    Social History: Reviewed history from 04/12/2009 and no changes required. Never Smoked Alcohol use-no Drug use-no Regular exercise-yes Currently takes care of one child and occasionally her mother. Lives at home with daughter.   Review of Systems       REVIEW OF SYSTEMS  GEN: No systemic complaints, no fevers, chills, sweats, or other acute illnesses MSK: Detailed in the HPI GI: tolerating PO intake without difficulty Neuro: No numbness, parasthesias, or tingling associated. Otherwise the pertinent positives of the ROS are noted above.    Physical Exam  General:  GEN: Well-developed,well-nourished,in no acute distress; alert,appropriate and cooperative throughout examination HEENT: Normocephalic and atraumatic without obvious abnormalities. No apparent alopecia or balding. Ears, externally no deformities PULM: Breathing comfortably in no respiratory distress EXT: No clubbing, cyanosis, or edema PSYCH: Normally interactive. Cooperative during the interview. Pleasant. Friendly and conversant. Not anxious or depressed appearing. Normal, full affect.  Msk:  B hand Ecchymosis or edema: dorsal wrist synovitis and some MCP synovitis ROM wrist/hand/digits/elbow: loss of approx 30% of flexion and ext  at wrist Carpals, MCP's, digits: NT Distal Ulna and Radius: NT Cysts/nodules: neg Finkelstein's test: neg Snuffbox tenderness: neg Scaphoid tubercle: NT Hook of Hamate: NT Resisted supination: NT Full composite fist Grip, all digits: 4/5 grip and 4/5 pincer str Axial load test: pain Phalen's: neg Tinel's: neg Atrophy: neg  Hand sensation: intact    Impression & Recommendations:  Problem # 1:  WRIST PAIN, BILATERAL (ICD-719.43) Assessment Unchanged  I do not think this is rheumatological disease with normal ESR and CRP. Basic lab work-up neg. Prominant dorsal synovitis, made worse with repetitive activities. Will try to calm down this week, wear cock-up wrist splints more this week.   Ice two times a day for the next week  Injection, Intraarticular Wrist, RIGHT Patient verbally consented for procedure; risks, benefits, and alternatives explained. Patient prepped with betadine. Ethyl chloride used for anesthesia. Using sterile technique, just distal to Lister's tubercle, using 3 cc syringe with 22 gauge needle, needle inserted and aspirated, no blood. Then 1/2 cc Marcaine 0.5% and 1/2 cc Kenalog 40 mg injected without difficulty into wrist.  Pressue applied, minimal blood. Tolerated well, decreased pain, no complications.   Orders: Joint Aspirate / Injection, Intermediate (20605) Kenalog 10mg  (4units) (J3301)  Problem # 2:  ARTHRALGIA, BOTH WRISTS (ICD-719.43)  Injection, Intraarticular Wrist, LEFT Patient verbally consented for procedure; risks, benefits, and alternatives explained. Patient prepped with betadine. Ethyl chloride used for anesthesia. Using sterile technique, just distal to Lister's tubercle, using 3 cc syringe with 22 gauge needle, needle inserted and aspirated, no blood. Then 1/2 cc Marcaine 0.5% and 1/2 cc Kenalog 40 mg injected without difficulty into wrist.  Pressue applied, minimal blood. Tolerated well, decreased pain, no complications.   Orders: Joint  Aspirate / Injection, Intermediate (04540)  Complete Medication List: 1)  Fish Oil Caps (Omega-3 fatty acids caps) .... Use as directed 2)  Proair Hfa 108 (90 Base)  Mcg/act Aers (Albuterol sulfate) .... Inhale 2 puffs four times a day as needed 3)  Calcarb 600/d 600-125 Mg-unit Tabs (Calcium-vitamin d) .... Take one tablet by mouth three times a day with meals 4)  Anacin 81 Mg Tbec (Aspirin) .... Take one tablet by mouth daily 5)  Doxepin Hcl 25 Mg Caps (Doxepin hcl) .... Take one capsule by mouth at night 6)  Ibu 800 Mg Tabs (Ibuprofen) .... Take 1 tablet by mouth every eight hours with food as needed for pain. 7)  Hydrocortisone 2.5 % Lotn (Hydrocortisone) .... Apply to affected areas twice a day. 8)  Cortisporin 0.5-0.5-10000 Crea (Neomycin-polymyxin-hc) .... Apply to ear canal twice daily until pain resolves. 9)  Flexeril 10 Mg Tabs (Cyclobenzaprine hcl) .... Take 1 tablet by mouth three times a day as needed for pain/spasms 10)  Fluocinonide 0.05 % Soln (Fluocinonide) .... Apply as previously done 11)  Pataday 0.2 % Soln (Olopatadine hcl) .... Apply to eye as directed (as previously done) 12)  Red Yeast Rice 600 Mg Tabs (Red yeast rice extract) .... Take 1 tablet twice daily 13)  Nasonex 50 Mcg/act Susp (Mometasone furoate) .... 2 sprays in each nostril daily 14)  Flexeril 10 Mg Tabs (Cyclobenzaprine hcl) 15)  Voltaren 1 % Gel (Diclofenac sodium) .... Apply 4 times daily as needed wrist pain (failure, multiple oral nsaids)

## 2010-10-11 NOTE — Consult Note (Signed)
Summary: [facility]  [facility]   Imported By: Louretta Parma 04/21/2010 09:59:43  _____________________________________________________________________  External Attachment:    Type:   Image     Comment:   External Document  Appended Document: [facility] Two small localized erosions seen in the gastric antrum. Biopsy performed. Currently on PPI.

## 2010-10-11 NOTE — Progress Notes (Signed)
Summary: prior authorization-Nasonex//kg  Phone Note From Pharmacy   Caller: [id number] Summary of Call: Received faxed request from pt's pharmacy requesting a prior authorization on pt's nasonex.  The preferred medication for pt's insurance plan is Flonase.  Message left on pt's recorder to contact the clinic about this matter and note forwarded to pt's pcp to see if rx could be changed to flonase instead of the nasonex.[facility])  July 07, 2010 12:21 PM   Follow-up for Phone Call        During my office visit in 7/11, pt didn't complaint of any airway complaint and so I didn't change any of her meds for that. So unfortunately I am not involved in decision regarding giving her flonase or nasonex, as I don't know her disease properly. So please let the PCP know about this.  Thank you.

## 2010-10-13 NOTE — Progress Notes (Signed)
Summary: Cost of Cholesterol med  Phone Note Call from Patient   Caller: Patient Call For: Almyra Deforest MD Summary of Call: Call from pt said thta she had not been called back about the medication for her Cholesterol.  Pt said that the doctor was trying to find a cheaper solution.  Pt said that she would like to get something cheaper.  call to [facility] the Crestor will cost over $100.00 . Initial call taken by: Angelina Ok RN,  August 30, 2010 4:00 PM  Follow-up for Phone Call        Given she can't afford Crestor or Lipitor, and she is allergic to Pravastatin and Simvastatin, will try Lovastatin. She will have to make an appointment and followup with her PCP.  Called patient back. Left a message to come pick up the script and to make a followup appointment in the next 4 weeks with her PCP. Also instructed her to discontinue the med if she experiences side effects. Follow-up by: Sunaina Ferrando    New/Updated Medications: LOVASTATIN 20 MG TABS (LOVASTATIN) 1 tablet by mouth daily. Take with evening meals. Prescriptions: LOVASTATIN 20 MG TABS (LOVASTATIN) 1 tablet by mouth daily. Take with evening meals.  #30 x 1   Entered and Authorized by:   Jaci Lazier MD   Signed by:   Jaci Lazier MD on 08/30/2010   Method used:   Print then Give to Patient   RxID:   [id number]

## 2010-10-13 NOTE — Assessment & Plan Note (Signed)
Summary: "stomach pain"/pcp-iilath/hla   Vital Signs:  Patient profile:   46 year old female Height:      62 inches (157.48 cm) Weight:      150.1 pounds (68.23 kg) BMI:     27.55 Temp:     98.1 degrees F oral Pulse rate:   115 / minute BP sitting:   130 / 86  (right arm) Cuff size:   regular  Vitals Entered By: Chinita Pester RN (August 18, 2010 2:20 PM) CC: Right lower flank pain also abdomen and lower back. Is Patient Diabetic? No Pain Assessment Patient in pain? yes     Location: right flank/stomach Intensity: 7/10 Type: dull Onset of pain  Intermittent Nutritional Status BMI of 25 - 29 = overweight  Have you ever been in a relationship where you felt threatened, hurt or afraid?No   Does patient need assistance? Functional Status Self care Ambulation Normal   CC:  Right lower flank pain also abdomen and lower back..  Depression History:      The patient denies a depressed mood most of the day and a diminished interest in her usual daily activities.         Preventive Screening-Counseling & Management  Alcohol-Tobacco     Alcohol drinks/day: 0     Smoking Status: never  Caffeine-Diet-Exercise     Does Patient Exercise: yes     Type of exercise: WALKING     Times/week: 3  Allergies: 1)  ! Codeine 2)  ! Pravachol 3)  Zocor   Complete Medication List: 1)  Fish Oil Caps (Omega-3 fatty acids caps) .... Use as directed 2)  Proair Hfa 108 (90 Base) Mcg/act Aers (Albuterol sulfate) .... Inhale 2 puffs four times a day as needed 3)  Calcarb 600/d 600-125 Mg-unit Tabs (Calcium-vitamin d) .... Take one tablet by mouth three times a day with meals 4)  Anacin 81 Mg Tbec (Aspirin) .... Take one tablet by mouth daily 5)  Doxepin Hcl 25 Mg Caps (Doxepin hcl) .... Take one capsule by mouth at night 6)  Cortisporin 0.5-0.5-10000 Crea (Neomycin-polymyxin-hc) .... Apply to ear canal twice daily until pain resolves. 7)  Flexeril 10 Mg Tabs (Cyclobenzaprine hcl) .... Take  1 tablet by mouth three times a day as needed for pain/spasms 8)  Fluocinonide 0.05 % Soln (Fluocinonide) .... Apply as previously done 9)  Pataday 0.2 % Soln (Olopatadine hcl) .... Apply to eye as directed (as previously done) 10)  Red Yeast Rice 600 Mg Tabs (Red yeast rice extract) .... Take 1 tablet twice daily 11)  Nasonex 50 Mcg/act Susp (Mometasone furoate) .... 2 sprays in each nostril daily 12)  Voltaren 1 % Gel (Diclofenac sodium) .... Apply 4 times daily as needed wrist pain (failure, multiple oral nsaids) 13)  Omeprazole 40 Mg Cpdr (Omeprazole) .... Take 1 tablet by mouth daily. 14)  Senna-docusate Sodium 8.6-50 Mg Tabs (Sennosides-docusate sodium) .... Take 1 tablet every 12 hour for constipation as needed. 15)  Apap 325 Mg Tabs (Acetaminophen) .... 2 tabs four times a day as needed for pain 16)  Hydrocortisone Butyrate 0.1 % Crea (Hydrocortisone butyrate) .... Apply on your skin two times a day as needed     Prevention & Chronic Care Immunizations   Influenza vaccine: Fluvax Non-MCR  (06/01/2010)    Tetanus booster: Not documented    Pneumococcal vaccine: Not documented  Other Screening   Pap smear: Normal  (10/10/2002)   Pap smear action/deferral: Not indicated S/P hysterectomy  (03/30/2009)  Mammogram: ASSESSMENT: Negative - BI-RADS 1^MM DIGITAL SCREENING  (01/04/2010)   Mammogram due: 10/07/2009   Smoking status: never  (08/18/2010)  Lipids   Total Cholesterol: 233  (04/08/2010)   Lipid panel action/deferral: Lipid Panel ordered   LDL: 155  (04/08/2010)   LDL Direct: Not documented   HDL: 56  (04/08/2010)   Triglycerides: 109  (04/08/2010)   Lipid panel due: 09/30/2009    SGOT (AST): 19  (04/08/2010)   SGPT (ALT): 15  (04/08/2010)   Alkaline phosphatase: 72  (04/08/2010)   Total bilirubin: 0.5  (04/08/2010)  Self-Management Support :   Personal Goals (by the next clinic visit) :      Personal LDL goal: 100  (11/10/2009)    Patient will work on the  following items until the next clinic visit to reach self-care goals:     Medications and monitoring: take my medicines every day, bring all of my medications to every visit  (08/18/2010)     Eating: eat more vegetables, use fresh or frozen vegetables, eat baked foods instead of fried foods  (08/18/2010)     Activity: take a 30 minute walk every day  (08/18/2010)    Lipid self-management support: Written self-care plan  (08/18/2010)   Lipid self-care plan printed.   Appended Document: "stomach pain"/pcp-iilath/hla blank

## 2010-10-13 NOTE — Miscellaneous (Signed)
  Clinical Lists Changes  Tried to contact patient over the phone to start Crestor, unfortunately could not reach her. I have flagged her PCP, she will need to be started on a statin.

## 2010-12-15 LAB — URINALYSIS, ROUTINE W REFLEX MICROSCOPIC
Glucose, UA: NEGATIVE mg/dL
Ketones, ur: NEGATIVE mg/dL
Leukocytes, UA: NEGATIVE
Nitrite: NEGATIVE
Specific Gravity, Urine: 1.015 (ref 1.005–1.030)
pH: 6 (ref 5.0–8.0)

## 2010-12-15 LAB — GC/CHLAMYDIA PROBE AMP, GENITAL
Chlamydia, DNA Probe: NEGATIVE
GC Probe Amp, Genital: NEGATIVE

## 2010-12-15 LAB — URINE MICROSCOPIC-ADD ON

## 2010-12-15 LAB — WET PREP, GENITAL: Yeast Wet Prep HPF POC: NONE SEEN

## 2010-12-16 LAB — POCT URINALYSIS DIP (DEVICE)
Bilirubin Urine: NEGATIVE
Ketones, ur: NEGATIVE mg/dL
Nitrite: NEGATIVE
Protein, ur: NEGATIVE mg/dL
pH: 7.5 (ref 5.0–8.0)

## 2010-12-21 LAB — URINALYSIS, ROUTINE W REFLEX MICROSCOPIC
Bilirubin Urine: NEGATIVE
Ketones, ur: NEGATIVE mg/dL
Nitrite: NEGATIVE
Urobilinogen, UA: 0.2 mg/dL (ref 0.0–1.0)

## 2010-12-21 LAB — CBC
HCT: 37.5 % (ref 36.0–46.0)
Hemoglobin: 13.1 g/dL (ref 12.0–15.0)
MCV: 86.9 fL (ref 78.0–100.0)
RBC: 4.32 MIL/uL (ref 3.87–5.11)
WBC: 5.3 10*3/uL (ref 4.0–10.5)

## 2010-12-21 LAB — WET PREP, GENITAL

## 2011-01-27 NOTE — H&P (Signed)
NAME:  Briana Parker, Briana Parker                           ACCOUNT NO.:  [id number]   MEDICAL RECORD NO.:  [id number]                   PATIENT TYPE:  OUT   LOCATION:  ULT                                  FACILITY:  [facility]   PHYSICIAN:  Roseanna Rainbow, M.D.         DATE OF BIRTH:  Aug 31, 1966   DATE OF ADMISSION:  05/12/2004  DATE OF DISCHARGE:  05/12/2004                                HISTORY & PHYSICAL   CHIEF COMPLAINT:  The patient is a 45 year old African American female with  uterine fibroids and secondary menorrhagia and pelvic pain for a diagnostic  laparoscopy with likely total vaginal hysterectomy.   HISTORY OF PRESENT ILLNESS:  The patient has a long history of menorrhagia.  Her pelvic pain did not improve with a course of Lupron several years ago.  Recent work up has included a Pap smear from May 05, 2004 that was within  normal limits and a normal TSH as well as a hemoglobin of 11.  A most recent  ultrasound demonstrated a uterus with a maximal sagittal diameter of 10 cm  with multiple myomas averaging approximately 4.5 cm in diameter.   PAST OBSTETRIC AND GYNECOLOGIST HISTORY:  As above.  She is status post a  bilateral tubal ligation.  She has four living children.   PAST MEDICAL HISTORY:  Migraine headaches.   PAST SURGICAL HISTORY:  See above.   SOCIAL HISTORY:  She is married, living with her spouse, self-employed.  She  has no significant smoking history.  She does not give any significant  history of alcohol usage.  She denies illicit drug use.   FAMILY HISTORY:  Myocardial infarction, cerebrovascular accident, adult  onset diabetes mellitus.   MEDICATIONS:  Hemocyte Plus.   ALLERGIES:  Codeine.   PHYSICAL EXAMINATION:  VITAL SIGNS:  Weight 121 pounds, temperature 98.1,  pulse 86, blood pressure 95/67.  GENERAL: An African American female appears stated age.  Normal body  habitus.  NECK/THYROID:  No enlargement, tenderness or mass in the thyroid node.  LUNGS:  Clear to auscultation bilaterally.  HEART:  A regular rate and rhythm.  ABDOMEN:  Soft and nontender without masses.  Bowel sounds active.  PELVIC:  Normal BUS.  On speculum exam, the vagina is clean.  The cervix is  clear, firm and closed.  No visible lesions.  BIMANUAL:  The uterus is mildly enlarged.  The position is anteverted.  Shape is irregular and globular, approximately 10-12 weeks aggregate size.  Adnexa:  No masses, organomegaly or local guarding.   ASSESSMENT:  Uterine fibroids with secondary menorrhagia, pelvic pain.   PLAN:  The planned procedure is a diagnostic laparoscopy with likely total  abdominal hysterectomy, possible total abdominal hysterectomy.  The risks  and benefits and alternative forms of management were reviewed with the  patient and informed consent had been obtained.  Roseanna Rainbow, M.D.    Briana Parker  D:  05/23/2004  T:  05/23/2004  Job:  [id number]

## 2011-01-27 NOTE — Discharge Summary (Signed)
Briana Parker, Briana Parker                 ACCOUNT NO.:  [id number]   MEDICAL RECORD NO.:  [id number]          PATIENT TYPE:  INP   LOCATION:  9306                          FACILITY:  [facility]   PHYSICIAN:  Roseanna Rainbow, M.D.DATE OF BIRTH:  1965-09-28   DATE OF ADMISSION:  06/03/2004  DATE OF DISCHARGE:  06/05/2004                                 DISCHARGE SUMMARY   CHIEF COMPLAINT:  The patient is a 45 year old African-American female with  uterine fibroids and secondary menorrhagia and pelvic pain for diagnostic  laparoscopy with likely total vaginal hysterectomy.  Please see the dictated  history and physical for further details.   HOSPITAL COURSE:  The patient was admitted and underwent a diagnostic  laparoscopy with a total abdominal hysterectomy.  Please see the dictated  operative summary for further details.  ON postoperative day #1, the patient  had a panic attack.  She has a history of anxiety disorder that has not  previously been treated.  The remainder of her postoperative course was  uneventful.  She was discharged to home on postoperative day #2 tolerating a  regular diet.   DISCHARGE DIAGNOSES:  1.  Uterine fibroids.  2.  Pelvic adhesions.   PROCEDURE:  Diagnostic laparotomy, total abdominal hysterectomy, and lysis  of adhesions.   CONDITION ON DISCHARGE:  Good.   DIET:  Regular.   DISCHARGE MEDICATIONS:  1.  Percocet.  2.  Ibuprofen.  3.  Ambien.  4.  Resume home medications.   ACTIVITY:  No strenuous activity, pelvic rest.   DISPOSITION:  The patient is to follow up in several days for staple  removal.      LAJ/MEDQ  D:  06/28/2004  T:  06/29/2004  Job:  [id number]

## 2011-01-27 NOTE — Op Note (Signed)
Briana Parker, Briana Parker                 ACCOUNT NO.:  [id number]   MEDICAL RECORD NO.:  [id number]          PATIENT TYPE:  INP   LOCATION:  9306                          [facility]ACILITY:  [facility]   PHYSICIAN:  Roseanna Rainbow, M.D.DATE OF BIRTH:  02/03/1966   DATE OF PROCEDURE:  06/03/2004  DATE OF DISCHARGE:                                 OPERATIVE REPORT   PREOPERATIVE DIAGNOSES:  Uterine fibroids, pelvic pain.   POSTOPERATIVE DIAGNOSES:  Uterine fibroids, pelvic pain, adhesions.   PROCEDURE:  Diagnostic laparoscopy, total abdominal hysterectomy and lysis  of adhesions.   SURGEON:  Roseanna Rainbow, M.D.   ANESTHESIA:  General endotracheal.   COMPLICATIONS:  None.   ESTIMATED BLOOD LOSS:  200 mL.   FLUIDS AND URINE OUTPUT:  As per anesthesiology.   FINDINGS:  Upon survey of the pelvic anatomy at laparoscopy, the uterus  appeared globular. There were adhesions involving the round ligaments to the  peritoneum of the anterior cul-de-sac bilaterally.  There were also  adhesions involving the large bowel to the ovaries and tubes bilaterally.  The ovaries appeared otherwise within normal limits.   DESCRIPTION OF PROCEDURE:  The risks, benefits, indications and alternatives  of the procedure were reviewed with the patient and informed consent was  obtained. She was taken to the operating room with an IV running.  The  patient was placed in the dorsal lithotomy position, given general  anesthesia and prepped and draped in the usual sterile fashion. A bivalve  speculum was placed in the patient's vagina and the anterior lip of the  cervix was grasped with a single tooth tenaculum. A Hulka manipulator was  then advanced into the uterus and secured to the anterior lip of the cervix  as the means to manipulate the uterus.  The speculum was removed from the  vagina. Attention was then turned to the patient's abdomen where a 10 mm  skin incision was made in the umbilical fold. The Veress  needle was  introduced into the abdominal cavity while tinting up the abdominal wall at  a 45 degree angle. Intraabdominal placement was confirmed by a saline drop  test and low pressure CO2 readings.  A pneumoperitoneum was obtained with 4  liters of CO2 gas. The trocar and sleeve were then advanced without  difficulty into the abdomen. Intraabdominal placement was confirmed by the  laparoscope. A survey of the patient's pelvis revealed the above findings.  At this point, the instruments were removed from the patient's abdomen.  A  Pfannenstiel skin incision was then made approximately 2 cm above the  symphysis pubis and extended to the rectus fascia. The fascia was then  incised bilaterally with curved Mayo scissors and the muscles of the  anterior abdominal wall were separated in the midline.  The  parietoperitoneum was grasped between two pickups, elevated and entered  sharply  with the scalpel.  An O'Connor-O'Sullivan retractor was then placed  into the incision and the bowel packed away with moistened laparotomy  sponges.  Two long Kelly clamps were placed in the cornua and used for  retraction.  The above noted adhesions were then divided with cautery.  The  round ligaments on both sides were divided with cautery. The anterior leaf  of the broad ligament was incised along the bladder reflection to the  midline from both sides. The bladder was dissected off the lower uterine  segment and cervix. The uteroovarian ligament and fallopian tubes on both  sides were then doubly clamped, transected and both free ligatures and  suture ligatures of #0 Vicryl were then placed.  Hemostasis was visualized.  The uterine arteries were then skeletonized bilaterally, clamped with  parametrial clamps, transected and suture ligated with #0 Vicryl. At this  point, the uterine fundus was amputated above the uterine artery pedicles.  The uterosacral ligaments were clamped on both sides, transected and  suture  ligated in a similar fashion.  The cervix was then amputated.  The remainder  of the vaginal cuff was closed with a series of interrupted #0 Vicryl figure-  of-eight sutures. Hemostasis was assured.  The pelvis was copiously  irrigated with warm normal saline. All laparotomy sponges and instruments  were removed from the abdomen. The parieotperitoneum was closed with 2-0  Vicryl. The fascia was closed with running #0 Vicryl.  The skin was closed  with staples.  The periumbilical incision was repaired with 3-0 Vicryl and  Dermabond.  Sponge, lap, needle and instrument counts were correct x2.  The  Hulka manipulator was removed with minimal bleeding noted from the cervix.  The patient was taken to the PACU awake and in stable condition.      LAJ/MEDQ  D:  06/03/2004  T:  06/04/2004  Job:  [id number]

## 2011-01-27 NOTE — Discharge Summary (Signed)
Briana Parker, Briana Parker                 ACCOUNT NO.:  [id number]   MEDICAL RECORD NO.:  [id number]          PATIENT TYPE:  INP   LOCATION:  9306                          [facility]ACILITY:  [facility]   PHYSICIAN:  Roseanna Rainbow, M.D.DATE OF BIRTH:  [birth year]   DATE OF ADMISSION:  06/03/2004  DATE OF DISCHARGE:  06/05/2004                                 DISCHARGE SUMMARY   CHIEF COMPLAINT:  The patient is a 45 year old African-American female with  uterine fibroids and secondary menorrhagia and pelvic pain who presents for  a diagnostic laparoscopy with possible total vaginal hysterectomy.  Please  see the dictated History and Physical for further details.   HOSPITAL COURSE:  The patient was admitted and underwent a diagnostic  laparoscopy and total abdominal hysterectomy with lysis of adhesions.  Please see the dictated operative summary for further details.  The  patient's postoperative course was remarkable for hemoglobin of 9.5 on  postoperative day #1.  She was hemodynamically stable.  The remainder of her  hospital course was uneventful.  She was discharged to home on postoperative  day #2 tolerating a regular diet.   DISCHARGE DIAGNOSES:  1.  Uterine fibroids.  2.  Pelvic adhesions.   PROCEDURE:  Diagnostic laparoscopy, total abdominal hysterectomy, and lysis  of adhesions.   CONDITION:  Good.   DIET:  Regular.   MEDICATIONS:  Percocet, ibuprofen, and Ambien.   ACTIVITY:  No strenuous activity, pelvic rest.   DISPOSITION:  The patient was to follow up in the office on June 07, 2004 for staple removal.      LAJ/MEDQ  D:  06/05/2004  T:  06/06/2004  Job:  [id number]

## 2011-02-01 ENCOUNTER — Encounter: Payer: Self-pay | Admitting: Internal Medicine

## 2011-03-23 ENCOUNTER — Encounter: Payer: Self-pay | Admitting: Internal Medicine

## 2011-06-16 LAB — POCT URINALYSIS DIP (DEVICE)
Hgb urine dipstick: NEGATIVE
Ketones, ur: NEGATIVE mg/dL
Protein, ur: NEGATIVE mg/dL
Specific Gravity, Urine: 1.015 (ref 1.005–1.030)
pH: 6.5 (ref 5.0–8.0)

## 2011-06-23 LAB — CBC
HCT: 39.8
MCHC: 34.3
MCV: 87.2
Platelets: 273
RDW: 12.4
WBC: 6.5

## 2011-06-23 LAB — I-STAT 8, (EC8 V) (CONVERTED LAB)
Acid-base deficit: 1
Glucose, Bld: 106 — ABNORMAL HIGH
Hemoglobin: 14.3
Potassium: 3.7
Sodium: 139
TCO2: 26

## 2011-06-23 LAB — POCT I-STAT CREATININE
Creatinine, Ser: 0.8
Operator id: 294521

## 2011-06-23 LAB — DIFFERENTIAL
Basophils Relative: 0
Eosinophils Absolute: 0
Eosinophils Relative: 0
Lymphs Abs: 1.1

## 2011-06-23 LAB — PROTIME-INR: Prothrombin Time: 12.8

## 2011-09-01 ENCOUNTER — Other Ambulatory Visit (HOSPITAL_COMMUNITY): Payer: Self-pay | Admitting: Family Medicine

## 2011-09-01 DIAGNOSIS — Z1231 Encounter for screening mammogram for malignant neoplasm of breast: Secondary | ICD-10-CM

## 2011-10-04 ENCOUNTER — Ambulatory Visit (HOSPITAL_COMMUNITY): Payer: Self-pay

## 2011-11-29 ENCOUNTER — Emergency Department (HOSPITAL_COMMUNITY)
Admission: EM | Admit: 2011-11-29 | Discharge: 2011-11-29 | Disposition: A | Discharge status: Home / Self Care | Payer: Self-pay | Source: Home / Self Care | Attending: Family Medicine | Admitting: Family Medicine

## 2011-11-29 ENCOUNTER — Encounter (HOSPITAL_COMMUNITY): Payer: Self-pay | Admitting: *Deleted

## 2011-11-29 DIAGNOSIS — K219 Gastro-esophageal reflux disease without esophagitis: Secondary | ICD-10-CM

## 2011-11-29 LAB — POCT URINALYSIS DIP (DEVICE)
Glucose, UA: NEGATIVE mg/dL
Hgb urine dipstick: NEGATIVE
Nitrite: NEGATIVE
Protein, ur: NEGATIVE mg/dL
Specific Gravity, Urine: >=1.03 (ref 1.005–1.030)
Urobilinogen, UA: 1 mg/dL (ref 0.0–1.0)
pH: 5.5 (ref 5.0–8.0)

## 2011-11-29 MED ORDER — GI COCKTAIL ~~LOC~~
30.0000 mL | Freq: Once | ORAL | Status: AC
  Administered 2011-11-29: 30 mL via ORAL

## 2011-11-29 MED ORDER — GI COCKTAIL ~~LOC~~
ORAL | Status: AC
  Filled 2011-11-29: qty 30

## 2011-11-29 NOTE — Discharge Instructions (Signed)
See your doctor if further problems. °

## 2011-11-29 NOTE — ED Notes (Signed)
Pt  Reports  Symptoms  Of    Low  abd  Pressure   Back  Pain  And      Symptoms  Off  uti   As   Well  -  P[t  States   She  Also  Reports  Epigastric  Pressure  Fullness     And   Pressure  Sensation in past

## 2011-11-29 NOTE — ED Provider Notes (Signed)
History     CSN: [id number]  Arrival date & time 11/29/11  02-08-02   First MD Initiated Contact with Patient 11/29/11 1713      Chief Complaint  Patient presents with  . Urinary Tract Infection    (Consider location/radiation/quality/duration/timing/severity/associated sxs/prior treatment) Patient is a 46 y.o. female presenting with abdominal pain. The history is provided by the patient.  Abdominal Pain The primary symptoms of the illness include abdominal pain and fatigue. The primary symptoms of the illness do not include nausea, vomiting or dysuria. Primary symptoms comment: numerous complaints, unending, constant , one after another., vague, anxious. The current episode started more than 2 days ago. The onset of the illness was gradual. The problem has not changed since onset. The patient has not had a change in bowel habit. Additional symptoms associated with the illness include heartburn and back pain. Significant associated medical issues include GERD.    Past Medical History  Diagnosis Date  . Fibromyalgia   . Hyperlipidemia   . Low back pain   . Allergic rhinitis     Past Surgical History  Procedure Date  . Abdominal hysterectomy     Family History  Problem Relation Age of Onset  . Coronary artery disease      Strong family history in first degree relatives  . Diabetes Mother     died recently in 2010-02-08 due to 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  She was on dialysis and died with cardiac arrest as per pt.    History  Substance Use Topics  . Smoking status: Never Smoker   . Smokeless tobacco: Not on file  . Alcohol Use: No    OB History    Grav Para Term Preterm Abortions TAB SAB Ect Mult Living                  Review of Systems  Constitutional: Positive for fatigue.  HENT: Negative.   Gastrointestinal: Positive for heartburn and abdominal pain. Negative for nausea and vomiting.  Genitourinary: Negative for dysuria.  Musculoskeletal: Positive for back pain.  Skin:  Positive for color change.  Psychiatric/Behavioral: Positive for agitation. The patient is nervous/anxious.     Allergies  Codeine; Pravastatin sodium; and Simvastatin  Home Medications   Current Outpatient Rx  Name Route Sig Dispense Refill  . ACETAMINOPHEN 325 MG PO TABS Oral Take 650 mg by mouth 4 (four) times daily as needed.      . ALBUTEROL SULFATE HFA 108 (90 BASE) MCG/ACT IN AERS Inhalation Inhale 2 puffs into the lungs 4 (four) times daily as needed.      . ASPIRIN 81 MG PO TBEC Oral Take 81 mg by mouth daily.      Marland Kitchen CALCIUM CARBONATE-VITAMIN D 600-200 MG-UNIT PO TABS Oral Take by mouth 3 (three) times daily with meals. Take one tablet by mouth three times a day with meals.  Calcarb 600/D 600-125 MG-UNIT Tabs     . CYCLOBENZAPRINE HCL 10 MG PO TABS Oral Take 10 mg by mouth 3 (three) times daily as needed.      Marland Kitchen DICLOFENAC SODIUM 1 % TD GEL Topical Apply topically 4 (four) times daily as needed. Apply 4 times daily as needed for wrist pain (Failure, multiple oral NSAIDS)     . DOXEPIN HCL 25 MG PO CAPS Oral Take 25 mg by mouth daily. Take one capsule by mouth at night     . OMEGA-3 FATTY ACIDS 1000 MG PO CAPS Oral Take 2 g by mouth  daily. Use as directed     . FLUOCINONIDE 0.05 % EX SOLN Topical Apply topically. Apply as previously done     . HYDROCORTISONE BUTYRATE 0.1 % EX CREA Topical Apply topically 2 (two) times daily as needed.      Marland Kitchen LOVASTATIN 20 MG PO TABS Oral Take 20 mg by mouth at bedtime. Take with evening meals     . MOMETASONE FUROATE 50 MCG/ACT NA SUSP Nasal 2 sprays by Nasal route daily. 2 sprays in each nostril daily     . NEOMYCIN-POLYMYXIN-HC 0.5 % EX CREA Topical Apply topically 2 (two) times daily. Apply to ear canal twice daily until pain resolves     . OLOPATADINE HCL 0.2 % OP SOLN Ophthalmic Apply to eye. Apply to eye as directed (as previously done)     . OMEPRAZOLE 40 MG PO CPDR Oral Take 40 mg by mouth daily.      . RED YEAST RICE 600 MG PO TABS Oral  Take by mouth. Take one tablet twice daily     . SENNOSIDES-DOCUSATE SODIUM 8.6-50 MG PO TABS Oral Take 1 tablet by mouth daily. Take one tablet every 12 hours as needed for constipation       BP 118/79  Pulse 104  Temp(Src) 97.8 F (36.6 C) (Oral)  Resp 16  SpO2 99%  Physical Exam  Nursing note and vitals reviewed. Constitutional: She is oriented to person, place, and time. She appears well-developed.  HENT:  Right Ear: External ear normal.  Left Ear: External ear normal.  Mouth/Throat: Oropharynx is clear and moist.  Eyes: Conjunctivae and EOM are normal. Pupils are equal, round, and reactive to light. No scleral icterus.  Neck: Normal range of motion. Neck supple.  Cardiovascular: Normal heart sounds.   Pulmonary/Chest: Breath sounds normal.  Abdominal: Soft. Bowel sounds are normal. She exhibits no distension and no mass. There is no tenderness. There is no rebound and no guarding.  Lymphadenopathy:    She has no cervical adenopathy.  Neurological: She is alert and oriented to person, place, and time.  Skin: Skin is warm and dry.  Psychiatric: Her mood appears anxious. Her speech is tangential. She expresses impulsivity.    ED Course  Procedures (including critical care time)  Labs Reviewed  POCT URINALYSIS DIP (DEVICE) - Abnormal; Notable for the following:    Ketones, ur TRACE (*)    All other components within normal limits   No results found.   1. GERD (gastroesophageal reflux disease)       MDM  Attempts to reassure pt of no apparent illness were unsuccessful, pt continued to have random persistent complaints.       Linna Hoff, MD 11/29/11 1755

## 2012-01-04 ENCOUNTER — Emergency Department (HOSPITAL_COMMUNITY)
Admission: EM | Admit: 2012-01-04 | Discharge: 2012-01-04 | Disposition: A | Discharge status: Home / Self Care | Payer: Self-pay | Source: Home / Self Care | Attending: Emergency Medicine | Admitting: Emergency Medicine

## 2012-01-04 ENCOUNTER — Encounter (HOSPITAL_COMMUNITY): Payer: Self-pay | Admitting: Emergency Medicine

## 2012-01-04 DIAGNOSIS — J019 Acute sinusitis, unspecified: Secondary | ICD-10-CM

## 2012-01-04 MED ORDER — AMOXICILLIN-POT CLAVULANATE 875-125 MG PO TABS: 1.0000 | ORAL_TABLET | Freq: Two times a day (BID) | ORAL | Status: AC

## 2012-01-04 NOTE — ED Provider Notes (Signed)
Briana Parker is a 46 y.o. female who presents to Urgent Care today for left sinus pain and pressure for the last 2 weeks. Worsening recently. Associated with body aches and chills. No fevers abdominal pain nausea vomiting or diarrhea. No difficulty breathing. Patient typically gets sinus infections. She has been taking Tylenol Sinus which has helped a little.   PMH reviewed. He does again for asthma and frequent sinus infections. No smoking ROS as above otherwise neg.  no chest pains, palpitations, fevers, , abdominal pain nausea or vomiting. Medications reviewed. No current facility-administered medications for this encounter.   Current Outpatient Prescriptions  Medication Sig Dispense Refill  . acetaminophen (TYLENOL) 325 MG tablet Take 650 mg by mouth 4 (four) times daily as needed.        Marland Kitchen albuterol (PROVENTIL HFA;VENTOLIN HFA) 108 (90 BASE) MCG/ACT inhaler Inhale 2 puffs into the lungs 4 (four) times daily as needed.        Marland Kitchen amoxicillin-clavulanate (AUGMENTIN) 875-125 MG per tablet Take 1 tablet by mouth 2 (two) times daily.  20 tablet  0  . aspirin 81 MG EC tablet Take 81 mg by mouth daily.        . Calcium Carbonate-Vitamin D 600-200 MG-UNIT TABS Take by mouth 3 (three) times daily with meals. Take one tablet by mouth three times a day with meals.  Calcarb 600/D 600-125 MG-UNIT Tabs       . cyclobenzaprine (FLEXERIL) 10 MG tablet Take 10 mg by mouth 3 (three) times daily as needed.        . diclofenac sodium (VOLTAREN) 1 % GEL Apply topically 4 (four) times daily as needed. Apply 4 times daily as needed for wrist pain (Failure, multiple oral NSAIDS)       . doxepin (SINEQUAN) 25 MG capsule Take 25 mg by mouth daily. Take one capsule by mouth at night       . fish oil-omega-3 fatty acids 1000 MG capsule Take 2 g by mouth daily. Use as directed       . fluocinonide (LIDEX) 0.05 % external solution Apply topically. Apply as previously done       . hydrocortisone butyrate (LUCOID) 0.1 %  CREA cream Apply topically 2 (two) times daily as needed.        . lovastatin (MEVACOR) 20 MG tablet Take 20 mg by mouth at bedtime. Take with evening meals       . mometasone (NASONEX) 50 MCG/ACT nasal spray 2 sprays by Nasal route daily. 2 sprays in each nostril daily       . neomycin-polymyxin-hydrocortisone (CORTISPORIN) 0.5 % cream Apply topically 2 (two) times daily. Apply to ear canal twice daily until pain resolves       . Olopatadine HCl 0.2 % SOLN Apply to eye. Apply to eye as directed (as previously done)       . omeprazole (PRILOSEC) 40 MG capsule Take 40 mg by mouth daily.        . Red Yeast Rice 600 MG TABS Take by mouth. Take one tablet twice daily       . senna-docusate (SENOKOT-S) 8.6-50 MG per tablet Take 1 tablet by mouth daily. Take one tablet every 12 hours as needed for constipation         Exam:  BP 114/68  Pulse 107  Temp(Src) 98.7 F (37.1 C) (Oral)  Resp 12  SpO2 100% Gen: Well NAD HEENT: EOMI,  MMM to palpation over left maxillary sinus. Normal posterior pharynx. Tympanic  membranes are retracted bilaterally without erythema. Lungs: CTABL Nl WOB Heart: RRR no MRG Abd: NABS, NT, ND Exts: Non edematous BL  LE, warm and well perfused.   No results found for this or any previous visit (from the past 24 hour(s)). No results found.  Assessment and Plan: 46 y.o. female with acute sinusitis. Plan to treat with Augmentin for 10 days and Zyrtec for allergy symptoms. Discussed warning signs or symptoms. Patient expresses understanding. Encouraged followup with primary care doctor if not improving. Please see discharge instructions.     Rodolph Bong, MD 01/04/12 [phone]

## 2012-01-04 NOTE — ED Notes (Signed)
Pt here with sinus sx whole left facial pain radiating to jaw/teeth,nasal congestion.post nasal drainage and body aches that started x 2weeks ago unrelieved by Claritin.pain in both eyes/left ear.no fevers

## 2012-01-04 NOTE — Discharge Instructions (Signed)
Thank you for coming in today. I think you have a sinus infection.  Please also start generic zyrtec.  Call or go to the emergency room if you get worse, have trouble breathing, have chest pains, or palpitations.   Sinusitis Sinuses are air pockets within the bones of your face. The growth of bacteria within a sinus leads to infection. The infection prevents the sinuses from draining. This infection is called sinusitis. SYMPTOMS  There will be different areas of pain depending on which sinuses have become infected.  The maxillary sinuses often produce pain beneath the eyes.   Frontal sinusitis may cause pain in the middle of the forehead and above the eyes.  Other problems (symptoms) include:  Toothaches.   Colored, pus-like (purulent) drainage from the nose.   Swelling, warmth, and tenderness over the sinus areas may be signs of infection.  TREATMENT  Sinusitis is most often determined by an exam.X-rays may be taken. If x-rays have been taken, make sure you obtain your results or find out how you are to obtain them. Your caregiver may give you medications (antibiotics). These are medications that will help kill the bacteria causing the infection. You may also be given a medication (decongestant) that helps to reduce sinus swelling.  HOME CARE INSTRUCTIONS   Only take over-the-counter or prescription medicines for pain, discomfort, or fever as directed by your caregiver.   Drink extra fluids. Fluids help thin the mucus so your sinuses can drain more easily.   Applying either moist heat or ice packs to the sinus areas may help relieve discomfort.   Use saline nasal sprays to help moisten your sinuses. The sprays can be found at your local drugstore.  SEEK IMMEDIATE MEDICAL CARE IF:  You have a fever.   You have increasing pain, severe headaches, or toothache.   You have nausea, vomiting, or drowsiness.   You develop unusual swelling around the face or trouble seeing.  MAKE SURE  YOU:   Understand these instructions.   Will watch your condition.   Will get help right away if you are not doing well or get worse.  Document Released: 08/28/2005 Document Revised: 08/17/2011 Document Reviewed: 03/27/2007 [facility] Patient Information 2012 [place], [place].

## 2012-01-06 NOTE — ED Provider Notes (Signed)
Medical screening examination/treatment/procedure(s) were performed by non-physician practitioner and as supervising physician I was immediately available for consultation/collaboration.  Tensley Wery, M.D.   Raeanna Soberanes C Liam Cammarata, MD 01/06/12 [id number] 

## 2012-02-19 ENCOUNTER — Ambulatory Visit (HOSPITAL_COMMUNITY)
Admission: RE | Admit: 2012-02-19 | Discharge: 2012-02-19 | Disposition: A | Discharge status: Home / Self Care | Payer: Self-pay | Source: Ambulatory Visit | Attending: Family Medicine | Admitting: Family Medicine

## 2012-02-19 DIAGNOSIS — Z1231 Encounter for screening mammogram for malignant neoplasm of breast: Secondary | ICD-10-CM | POA: Insufficient documentation

## 2012-04-19 ENCOUNTER — Encounter: Payer: Self-pay | Admitting: Physical Medicine and Rehabilitation

## 2012-05-09 ENCOUNTER — Ambulatory Visit (INDEPENDENT_AMBULATORY_CARE_PROVIDER_SITE_OTHER): Payer: Self-pay | Admitting: Internal Medicine

## 2012-05-09 ENCOUNTER — Encounter: Payer: Self-pay | Admitting: Internal Medicine

## 2012-05-09 VITALS — BP 107/80 | HR 85 | Temp 97.5°F | Ht 62.0 in | Wt 144.5 lb

## 2012-05-09 DIAGNOSIS — K219 Gastro-esophageal reflux disease without esophagitis: Secondary | ICD-10-CM

## 2012-05-09 DIAGNOSIS — E785 Hyperlipidemia, unspecified: Secondary | ICD-10-CM

## 2012-05-09 DIAGNOSIS — M545 Low back pain: Secondary | ICD-10-CM

## 2012-05-09 DIAGNOSIS — J45990 Exercise induced bronchospasm: Secondary | ICD-10-CM

## 2012-05-09 DIAGNOSIS — M5136 Other intervertebral disc degeneration, lumbar region: Secondary | ICD-10-CM

## 2012-05-09 LAB — COMPREHENSIVE METABOLIC PANEL
Alkaline Phosphatase: 77 U/L (ref 39–117)
Creat: 0.93 mg/dL (ref 0.50–1.10)
Glucose, Bld: 91 mg/dL (ref 70–99)
Sodium: 139 mEq/L (ref 135–145)
Total Bilirubin: 0.4 mg/dL (ref 0.3–1.2)
Total Protein: 7.3 g/dL (ref 6.0–8.3)

## 2012-05-09 MED ORDER — CARBOXYMETHYLCELLULOSE SODIUM 0.5 % OP SOLN: 2.0000 [drp] | Freq: Three times a day (TID) | OPHTHALMIC | Status: DC | PRN

## 2012-05-09 NOTE — Progress Notes (Signed)
Subjective:     Patient ID: Briana Parker, female   DOB: 05-Oct-1965, 47 y.o.   MRN: [id number]  HPI The patient is a 46 year old female who used to go to our clinic and [facility] and is now transferring back to our clinic as [facility] is closing. She does have past medical history of hyperlipidemia, allergies, exercise-induced asthma, low back pain. She states that over the last 3 years she's had this back pain and it's been off and on. It sounds more like muscle spasm. She states occasionally she'll get a shooting pain down both legs. She states that the pain is fairly manageable with Tylenol or ibuprofen. She also uses Flexeril when the pain gets very severe. She is still able to do her job and to participate in ADLs and IADLs independently. She does work as a Research scientist (life sciences). She is not having any complaints at today's visit. She states that her eyes are dry chronically and she would like some eye drops to try for them. She would also like her kidneys, sugars checked out at today's visit due to family history of diabetes. She has not fallen at home. She's not having any chest pains, shortness of breath, nausea, vomiting, diarrhea. She states that her mood is fairly good. Overall she states that her health is very good. No red flags signs. No weight loss.  Review of Systems  Constitutional: Negative for fever, chills, diaphoresis, activity change, appetite change, fatigue and unexpected weight change.  HENT: Negative.   Eyes: Negative.   Respiratory: Negative for cough, chest tightness, shortness of breath and wheezing.   Cardiovascular: Negative for chest pain, palpitations and leg swelling.  Gastrointestinal: Negative.  Negative for nausea, vomiting, abdominal pain, diarrhea, constipation and blood in stool.  Musculoskeletal: Positive for back pain. Negative for myalgias, joint swelling, arthralgias and gait problem.  Skin: Negative.   Neurological: Negative for  dizziness, tremors, seizures, syncope, facial asymmetry, speech difficulty, weakness, light-headedness, numbness and headaches.  Hematological: Negative.   Psychiatric/Behavioral: Negative.        Objective:   Physical Exam  Constitutional: She is oriented to person, place, and time. She appears well-developed and well-nourished. No distress.  HENT:  Head: Normocephalic and atraumatic.  Eyes: EOM are normal. Pupils are equal, round, and reactive to light.  Neck: Normal range of motion. Neck supple.  Cardiovascular: Normal rate and regular rhythm.   Pulmonary/Chest: Effort normal and breath sounds normal.  Abdominal: Soft. Bowel sounds are normal. She exhibits no distension. There is no tenderness. There is no rebound.  Musculoskeletal: Normal range of motion. She exhibits tenderness. She exhibits no edema.       Some paraspinal tenderness in the lumbar region.  Neurological: She is alert and oriented to person, place, and time. No cranial nerve deficit.  Skin: Skin is warm and dry. No rash noted. No erythema. No pallor.  Psychiatric: She has a normal mood and affect. Her behavior is normal. Judgment and thought content normal.       Assessment/Plan:   1. Please see problem oriented charting.  2. Disposition-the patient will have comprehensive metabolic panel drawn at today's visit. We will add eyedrops to help lubricate as she is complaining of dry eyes. We will try to refer to orthopedics for back injections. No other medication changes at today's visit. She will be seen back in 3 months for followup visit.

## 2012-05-09 NOTE — Patient Instructions (Signed)
You were seen today to come back to the clinic and we are checking some blood work and we are going to refer you to someone who can look at your back. If you have questions or problems please call our office at [phone].

## 2012-05-09 NOTE — Assessment & Plan Note (Signed)
The patient does occasionally use albuterol.

## 2012-05-09 NOTE — Assessment & Plan Note (Signed)
The patient has had some side effects with previous cholesterol medication and states that she currently takes Crestor 5 mg. Will check lipid panel at next visit.

## 2012-05-09 NOTE — Assessment & Plan Note (Signed)
Some muscular spasm may be involved as she does have paraspinal tenderness. She also has some radiation of tingling pains which may be related to her lumbar spine. She has no red flag signs and no need for further imaging at today's visit. Could consider getting imaging in the future. We'll refer to orthopedics for possible lumbar injection.

## 2012-05-14 ENCOUNTER — Ambulatory Visit (INDEPENDENT_AMBULATORY_CARE_PROVIDER_SITE_OTHER): Payer: Self-pay | Admitting: *Deleted

## 2012-05-14 DIAGNOSIS — Z111 Encounter for screening for respiratory tuberculosis: Secondary | ICD-10-CM

## 2012-05-16 LAB — TB SKIN TEST: Induration: 0 mm

## 2012-05-20 ENCOUNTER — Telehealth: Payer: Self-pay | Admitting: *Deleted

## 2012-05-20 NOTE — Telephone Encounter (Signed)
Pt calls and states that someone was to call her with results of labs and to schedule her appt at [facility], she has not heard anything, she states she told the doctor she wanted to be called even if everything was normal. Can you please call pt with results and i will also ask gladysh. To call pt with appt info, she states the appt setup now is too late in the morning she needs an appt early in the am so as not to miss work Ph# [phone]

## 2012-05-21 NOTE — Telephone Encounter (Signed)
She was referred to orthopedics however has no insurance and had to wait until September for referral to be sent in since she has no insurance.   Also, why does she not have a PCP? Will she ever be assigned one...  What apt time? Is this something I need to address?   Briana Parker can you tell her when you call about apts that her labs were fine and her kidneys, liver, and sugars were all normal?  Thanks,  Dr. Dorise Hiss

## 2012-05-22 ENCOUNTER — Telehealth: Payer: Self-pay | Admitting: *Deleted

## 2012-05-22 NOTE — Telephone Encounter (Signed)
Appointment to [facility] rescheduled to 06/14/2012 8:00 AM.  Pt needed earlier time for appointment.  Pt was called and informed of.  Angelina Ok, RN 05/22/2012 12:01 PM.

## 2012-05-30 ENCOUNTER — Ambulatory Visit (INDEPENDENT_AMBULATORY_CARE_PROVIDER_SITE_OTHER): Payer: Self-pay | Admitting: *Deleted

## 2012-05-30 DIAGNOSIS — Z299 Encounter for prophylactic measures, unspecified: Secondary | ICD-10-CM

## 2012-05-30 DIAGNOSIS — Z23 Encounter for immunization: Secondary | ICD-10-CM

## 2012-06-12 ENCOUNTER — Ambulatory Visit: Payer: Self-pay | Admitting: Physical Medicine and Rehabilitation

## 2012-06-14 ENCOUNTER — Ambulatory Visit: Payer: Self-pay | Admitting: Physical Medicine and Rehabilitation

## 2012-06-17 ENCOUNTER — Encounter: Payer: Self-pay | Attending: Physical Medicine and Rehabilitation | Admitting: Physical Medicine and Rehabilitation

## 2012-06-17 ENCOUNTER — Encounter: Payer: Self-pay | Admitting: Physical Medicine and Rehabilitation

## 2012-06-17 VITALS — BP 118/81 | HR 83 | Resp 14 | Ht 62.0 in | Wt 137.4 lb

## 2012-06-17 DIAGNOSIS — IMO0001 Reserved for inherently not codable concepts without codable children: Secondary | ICD-10-CM | POA: Insufficient documentation

## 2012-06-17 DIAGNOSIS — M545 Low back pain, unspecified: Secondary | ICD-10-CM | POA: Insufficient documentation

## 2012-06-17 DIAGNOSIS — M79609 Pain in unspecified limb: Secondary | ICD-10-CM | POA: Insufficient documentation

## 2012-06-17 DIAGNOSIS — M79604 Pain in right leg: Secondary | ICD-10-CM

## 2012-06-17 DIAGNOSIS — G8929 Other chronic pain: Secondary | ICD-10-CM | POA: Insufficient documentation

## 2012-06-17 DIAGNOSIS — Z5181 Encounter for therapeutic drug level monitoring: Secondary | ICD-10-CM

## 2012-06-17 DIAGNOSIS — M533 Sacrococcygeal disorders, not elsewhere classified: Secondary | ICD-10-CM

## 2012-06-17 MED ORDER — TRAMADOL-ACETAMINOPHEN 37.5-325 MG PO TABS: 1.0000 | ORAL_TABLET | Freq: Every day | ORAL | Status: DC

## 2012-06-17 NOTE — Progress Notes (Signed)
Subjective:    Patient ID: Briana Parker, female    DOB: August 26, 1966, 46 y.o.   MRN: [id number]  HPI  The patient is a 46 year old woman who has a chief complaint of low back pain which is located on both sides of her low lumbar region. Pain radiates alternately into right and left buttock area. The pain doesn't radiating further down the right leg today. She reports no numbness tingling or weakness.  This pain began approximately 3 years ago and had a gradual onset. She reports no inciting factors such as falls, injuries, motor vehicle accident.  She reports significant morning stiffness.   She occasionally has some stiffness and discomfort around her shoulder blades however this is not a significant problem currently  There is no family history of rheumatoid arthritis or systemic lupus erythematosus. Patient reports being tested a few years ago for SLE.  She has tried heat and stretching in both seem to help.  Muscle relaxer's make her sleepy She has tried Tylenol 500 mg 2 tablets twice a day  Ibuprofen 800 mg twice a day was also trialed.  She reports she has had endoscopy a couple of years ago and was advised not to take ibuprofen.  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 She typically does not need to lift or move patient's.    Pain Inventory Average Pain 10 Pain Right Now 8 My pain is constant, sharp and aching  In the last 24 hours, has pain interfered with the following? General activity 8 Relation with others 8 Enjoyment of life 9 What TIME of day is your pain at its worst? morning and daytime Sleep (in general) Fair  Pain is worse with: bending, sitting, standing and some activites Pain improves with: heat/ice Relief from Meds: 5  Mobility walk without assistance how many minutes can you walk? < 30 minutes sometimes ability to climb steps?  yes do you drive?  yes  Function employed # of  hrs/week 15 what is your job? Home Health Care  Neuro/Psych spasms  Prior Studies Any changes since last visit?  no  Physicians involved in your care Any changes since last visit?  no  [facility] is PCP   Family History  Problem Relation Age of Onset  . Coronary artery disease      Strong family history in first degree relatives  . Diabetes Mother     died recently in 2010-02-11 due to 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  She was on dialysis and died with cardiac arrest as per pt.  . Heart disease Father   . Hypertension Father    History   Social History  . Marital Status: Single    Spouse Name: N/A    Number of Children: N/A  . Years of Education: N/A   Social History Main Topics  . Smoking status: Never Smoker   . Smokeless tobacco: Never Used  . Alcohol Use: No  . Drug Use: No  . Sexually Active: None   Other Topics Concern  . None   Social History Narrative   Exercises Regularly, currently takes care of one child.  Lives at home with daughter.   Past Surgical History  Procedure Date  . Abdominal hysterectomy    Past Medical History  Diagnosis Date  . Fibromyalgia   . Hyperlipidemia   . Low back pain   . Allergic rhinitis    BP 118/81  Pulse 83  Resp 14  Ht 5\' 2"  (1.575 m)  Wt 137 lb 6.4 oz (62.324 kg)  BMI 25.13 kg/m2  SpO2 99%    Review of Systems  Gastrointestinal: Positive for abdominal pain and constipation.  Musculoskeletal: Positive for back pain and arthralgias.       Spasms  All other systems reviewed and are negative.       Objective:   Physical Exam  [name] is a well-developed well-nourished African American woman who does not appear in any distress  She is oriented x3 her speech is clear her affect is bright she's alert cooperative and pleasant  She follows commands without difficulty answers questions appropriately  Cranial nerves are grossly intact Coordination is intact  Reflexes are 1+ at the patellar tendons 2+  at the Achilles tendons  There is no abnormal tone clonus or tremors  She reports intact sensation to light touch in both lower extremities without obvious focal deficit  Motor strength is generally good she has slightly decreased hip abduction on the left. But it is in the 4+ over 5 range.  Straight leg raise is negative  She transitions easily from sitting to standing. There are no pain behaviors  Forward flexion increases lumbar pain and slightly increased pain into right lower extremity posteriorly and slightly lateral in the right leg.  Extension bothers her somewhat but not as much  Heel toe walking, tandem gait, Romberg test performed adequately  Minimal tenderness noted over sacroiliac joint  Lumbar paraspinal muscle tenderness noted in lower lumbar segments  She has no pain with internal or external rotation at the hips however she is significantly decreased internal rotation at the left hip compared to the right.  Lumbar spine radiographs 2010 show mild L4-5 disc space narrowing.     Assessment & Plan:  1. Chronic low back pain with an onset approximately 3 years ago. Pain is worse with forward flexion suggesting discogenic pain. There is also some mild right lower extremity discomfort which is not as problematic as the low back pain itself.  Today reviewed previous x-rays. I discussed treatment options for chronic low back pain. I emphasized consideration of course of physical therapy to address core strengthening, body mechanics, postural training and address lower extremity/hip muscle imbalances.  The patient wishes to defer therapy at this time.  We discussed medication options she has trialed ibuprofen and Tylenol previously, they have not been helpful recently. She reports she has intolerance to ibuprofen and was advised by GI not to use.  To help maintain proper body mechanics during work and household tasks I have suggested a bite lumbar support to provide  proprioceptive input  She will trial ultacet in the morning only as this is the time she is most stiff and achy. I have reviewed risks and benefits of this medication with her.  She is also considering epidural steroid injection.  I have provided her with information regarding back injury prevention in her after visit summary.  I will see her back in one month.

## 2012-06-17 NOTE — Patient Instructions (Addendum)
Follow up in one month  Today we discussed different treatment options for your low back pain.  I understand you are considering epidural steroid injection for your back and leg pain however you would like to try more conservative measures prior.  I have reviewed previous x-rays from 2010 of your low back.   I have given you some information on back pain prevention  I am recommending physical therapy to address education on ergonomics and postural training as well as core strengthening and focusing on muscle imbalances around her hips.  I understand you would like to defer physical therapy at this time  I'm trialing you on ultracet, one in the morning when necessary.  I am also recommending a flexible lumbar support to wear while you are working.       Back Injury Prevention Back injuries can be extremely painful and difficult to heal. After having one back injury, you are much more likely to experience another later on. It is important to learn how to avoid injuring or re-injuring your back. The following tips can help you to prevent a back injury. PHYSICAL FITNESS  Exercise regularly and try to develop good tone in your abdominal muscles. Your abdominal muscles provide a lot of the support needed by your back.  Do aerobic exercises (walking, jogging, biking, swimming) regularly.  Do exercises that increase balance and strength (tai chi, yoga) regularly. This can decrease your risk of falling and injuring your back.  Stretch before and after exercising.  Maintain a healthy weight. The more you weigh, the more stress is placed on your back. For every pound of weight, 10 times that amount of pressure is placed on the back. DIET  Talk to your caregiver about how much calcium and vitamin D you need per day. These nutrients help to prevent weakening of the bones (osteoporosis). Osteoporosis can cause broken (fractured) bones that lead to back pain.  Include good sources of  calcium in your diet, such as dairy products, green, leafy vegetables, and products with calcium added (fortified).  Include good sources of vitamin D in your diet, such as milk and foods that are fortified with vitamin D.  Consider taking a nutritional supplement or a multivitamin if needed.  Stop smoking if you smoke. POSTURE  Sit and stand up straight. Avoid leaning forward when you sit or hunching over when you stand.  Choose chairs with good low back (lumbar) support.  If you work at a desk, sit close to your work so you do not need to lean over. Keep your chin tucked in. Keep your neck drawn back and elbows bent at a right angle. Your arms should look like the letter "L."  Sit high and close to the steering wheel when you drive. Add a lumbar support to your car seat if needed.  Avoid sitting or standing in one position for too long. Take breaks to get up, stretch, and walk around at least once every hour. Take breaks if you are driving for long periods of time.  Sleep on your side with your knees slightly bent, or sleep on your back with a pillow under your knees. Do not sleep on your stomach. LIFTING, TWISTING, AND REACHING  Avoid heavy lifting, especially repetitive lifting. If you must do heavy lifting:  Stretch before lifting.  Work slowly.  Rest between lifts.  Use carts and dollies to move objects when possible.  Make several small trips instead of carrying 1 heavy load.  Ask for help  when you need it.  Ask for help when moving big, awkward objects.  Follow these steps when lifting:  Stand with your feet shoulder-width apart.  Get as close to the object as you can. Do not try to pick up heavy objects that are far from your body.  Use handles or lifting straps if they are available.  Bend at your knees. Squat down, but keep your heels off the floor.  Keep your shoulders pulled back, your chin tucked in, and your back straight.  Lift the object slowly,  tightening the muscles in your legs, abdomen, and buttocks. Keep the object as close to the center of your body as possible.  When you put a load down, use these same guidelines in reverse.  Do not:  Lift the object above your waist.  Twist at the waist while lifting or carrying a load. Move your feet if you need to turn, not your waist.  Bend over without bending at your knees.  Avoid reaching over your head, across a table, or for an object on a high surface. OTHER TIPS  Avoid wet floors and keep sidewalks clear of ice to prevent falls.  Do not sleep on a mattress that is too soft or too hard.  Keep items that are used frequently within easy reach.  Put heavier objects on shelves at waist level and lighter objects on lower or higher shelves.  Find ways to decrease your stress, such as exercise, massage, or relaxation techniques. Stress can build up in your muscles. Tense muscles are more vulnerable to injury.  Seek treatment for depression or anxiety if needed. These conditions can increase your risk of developing back pain. SEEK MEDICAL CARE IF:  You injure your back.  You have questions about diet, exercise, or other ways to prevent back injuries. MAKE SURE YOU:  Understand these instructions.  Will watch your condition.  Will get help right away if you are not doing well or get worse. Document Released: 10/05/2004 Document Revised: 11/20/2011 Document Reviewed: 10/09/2011 [facility] Patient Information 2013 [place], [place].

## 2012-07-01 ENCOUNTER — Other Ambulatory Visit: Payer: Self-pay | Admitting: *Deleted

## 2012-07-01 DIAGNOSIS — M545 Low back pain: Secondary | ICD-10-CM

## 2012-07-01 NOTE — Telephone Encounter (Signed)
Appears flexeril was entered as a historical med. Pain therapy's last note indicated it made her sleepy. Will defer refill to PCP

## 2012-07-02 MED ORDER — CYCLOBENZAPRINE HCL 10 MG PO TABS: 10.0000 mg | ORAL_TABLET | Freq: Three times a day (TID) | ORAL | Status: DC | PRN

## 2012-07-02 NOTE — Telephone Encounter (Signed)
Called Ms. Exum.  She does use the flexeril mainly at night.  Although it does not completely resolve the pain it does help her to sleep.  She also will take it during the day on the weekends when she is not working.  I will therefore refill it.

## 2012-07-02 NOTE — Telephone Encounter (Signed)
Flexeril rx called to [facility]; pt informed.

## 2012-07-22 ENCOUNTER — Encounter: Payer: Self-pay | Admitting: Physical Medicine and Rehabilitation

## 2012-07-22 ENCOUNTER — Encounter: Payer: Self-pay | Attending: Physical Medicine and Rehabilitation | Admitting: Physical Medicine and Rehabilitation

## 2012-07-22 VITALS — BP 117/78 | HR 99 | Resp 14 | Ht 62.0 in | Wt 138.0 lb

## 2012-07-22 DIAGNOSIS — M5136 Other intervertebral disc degeneration, lumbar region: Secondary | ICD-10-CM

## 2012-07-22 DIAGNOSIS — M545 Low back pain, unspecified: Secondary | ICD-10-CM | POA: Insufficient documentation

## 2012-07-22 DIAGNOSIS — M79604 Pain in right leg: Secondary | ICD-10-CM

## 2012-07-22 DIAGNOSIS — M79609 Pain in unspecified limb: Secondary | ICD-10-CM

## 2012-07-22 MED ORDER — GABAPENTIN 300 MG PO CAPS: 300.0000 mg | ORAL_CAPSULE | Freq: Every day | ORAL | Status: DC

## 2012-07-22 MED ORDER — METHYLPREDNISOLONE 4 MG PO KIT: PACK | ORAL | Status: DC

## 2012-07-22 NOTE — Progress Notes (Signed)
Subjective:    Patient ID: Briana Parker, female    DOB: 03/19/1966, 46 y.o.   MRN: [id number]  HPI The patient is a 46 year old woman who has a chief complaint of low back pain which has persisted for approximately 3 years getting worse.  Over the last 12 weeks has developed progressive right lower extremity pain radiating from the low back through her buttocks and into the right lower extremity. Pain has been worsening in this right lower extremity. She reports the pain in the leg as constant but wax is and wanes in intensity averaging between 8 and 9 on a scale of 10.  At the last visit education on spine care was done, physical therapy was recommended but declined due to financial constraints.  Patient is interested in an epidural steroid injection but would like to try noninvasive options before.  The patient recalled taking tramadol in the past after it was prescribed and indicated to me that he can cause some nausea last time she tried. She deferred using tramadol from last visit.  Financial constraints also precluded obtaining lumbar support   her chief complaint continues to be predominantly right low back pain which radiates to the right lower extremity.     Pain Inventory Average Pain 9 Pain Right Now 8 My pain is sharp and aching  In the last 24 hours, has pain interfered with the following? General activity 5 Relation with others 5 Enjoyment of life 5 What TIME of day is your pain at its worst? all the time Sleep (in general) Fair  Pain is worse with: walking, sitting and inactivity Pain improves with: rest, heat/ice and medication Relief from Meds: 4  Mobility walk without assistance ability to climb steps?  yes do you drive?  yes  Function not employed: date last employed   Neuro/Psych bladder control problems bowel control problems weakness  Prior Studies Any changes since last visit?  no  Physicians involved in your care Any changes since last  visit?  no   Family History  Problem Relation Age of Onset  . Coronary artery disease      Strong family history in first degree relatives  . Diabetes Mother     died recently in 2010-03-04 due to 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  She was on dialysis and died with cardiac arrest as per pt.  . Heart disease Father   . Hypertension Father    History   Social History  . Marital Status: Single    Spouse Name: N/A    Number of Children: N/A  . Years of Education: N/A   Social History Main Topics  . Smoking status: Never Smoker   . Smokeless tobacco: Never Used  . Alcohol Use: No  . Drug Use: No  . Sexually Active: None   Other Topics Concern  . None   Social History Narrative   Exercises Regularly, currently takes care of one child.  Lives at home with daughter.   Past Surgical History  Procedure Date  . Abdominal hysterectomy    Past Medical History  Diagnosis Date  . Fibromyalgia   . Hyperlipidemia   . Low back pain   . Allergic rhinitis    BP 117/78  Pulse 99  Resp 14  Ht 5\' 2"  (1.575 m)  Wt 138 lb (62.596 kg)  BMI 25.24 kg/m2  SpO2 100%     Review of Systems  Musculoskeletal: Positive for back pain.  Neurological: Positive for weakness.  All other systems reviewed  and are negative.       Objective:   Physical Exam  The patient is a well-developed well-nourished African American woman who does not appear in any distress  She is oriented x3 her speech is clear her affect is bright she's alert cooperative and pleasant  She follows commands without difficulty answers questions appropriately  Cranial nerves are grossly intact  Coordination is intact  Reflexes are 1+ at the patellar tendons 2+ at the Achilles tendons  There is no abnormal tone clonus or tremors  She reports intact sensation to light touch in both lower extremities without obvious focal deficit  Motor strength is generally good she has slightly decreased hip abduction on the left. But it is in the 4+  over 5 range.  Straight leg raise is negative  She transitions easily from sitting to standing. There are no pain behaviors  Forward flexion increases lumbar pain and slightly increased pain into right lower extremity posteriorly and slightly lateral in the right leg.  Extension bothers her somewhat but not as much  Heel toe walking, tandem gait, Romberg test performed adequately  Minimal tenderness noted over sacroiliac joint  Lumbar paraspinal muscle tenderness noted in lower lumbar segments  She has no pain with internal or external rotation at the hips however she is significantly decreased internal rotation at the left hip compared to the right.  Lumbar spine radiographs 2010 show mild L4-5 disc space narrowing.        Assessment & Plan:  1.Worsening right lower extremity pain/chronic low back pain.  Plan: Would like to emphasize physical therapy however patient declines this secondary to financial constraints. Would like to try noninvasive options but is also open to epidural steroid injection.  Will place and Medrol dose pack and gabapentin 300 at at bedtime. I reviewed the risks and benefits of these medications. She understands we can also increase her gabapentin dose frequency.  Lumbar MRI is ordered.  I will see her back in 10 days.

## 2012-07-22 NOTE — Patient Instructions (Addendum)
I have ordered a Medrol dose pack for you to help you with your right leg pain.  I have ordered gabapentin to help with your leg pain as well. If you tolerate this medication me know and I can increase it for you.  I understand your frustrated by your pain. I would like to get you into physical therapy however I understand that this is not an option currently.  I would like to obtain a lumbar MRI prior to injection. I understand you're very interested in an epidural steroid injection to help you with your back and leg pain.  I will see you back in 10 days.

## 2012-07-25 ENCOUNTER — Ambulatory Visit (HOSPITAL_COMMUNITY)
Admission: RE | Admit: 2012-07-25 | Discharge: 2012-07-25 | Disposition: A | Discharge status: Home / Self Care | Payer: Self-pay | Source: Ambulatory Visit | Attending: Physical Medicine and Rehabilitation | Admitting: Physical Medicine and Rehabilitation

## 2012-07-25 DIAGNOSIS — M5136 Other intervertebral disc degeneration, lumbar region: Secondary | ICD-10-CM

## 2012-07-25 DIAGNOSIS — M5126 Other intervertebral disc displacement, lumbar region: Secondary | ICD-10-CM | POA: Insufficient documentation

## 2012-08-07 ENCOUNTER — Encounter: Payer: Self-pay | Admitting: Physical Medicine and Rehabilitation

## 2012-08-07 ENCOUNTER — Encounter (HOSPITAL_BASED_OUTPATIENT_CLINIC_OR_DEPARTMENT_OTHER): Payer: Self-pay | Admitting: Physical Medicine and Rehabilitation

## 2012-08-07 VITALS — BP 119/71 | HR 84 | Resp 14 | Ht 62.0 in | Wt 137.0 lb

## 2012-08-07 DIAGNOSIS — M545 Low back pain, unspecified: Secondary | ICD-10-CM

## 2012-08-07 DIAGNOSIS — M5430 Sciatica, unspecified side: Secondary | ICD-10-CM

## 2012-08-07 DIAGNOSIS — M5126 Other intervertebral disc displacement, lumbar region: Secondary | ICD-10-CM

## 2012-08-07 DIAGNOSIS — M5116 Intervertebral disc disorders with radiculopathy, lumbar region: Secondary | ICD-10-CM

## 2012-08-07 DIAGNOSIS — M5431 Sciatica, right side: Secondary | ICD-10-CM

## 2012-08-07 MED ORDER — TRAMADOL-ACETAMINOPHEN 37.5-325 MG PO TABS: 1.0000 | ORAL_TABLET | Freq: Two times a day (BID) | ORAL | Status: DC

## 2012-08-07 MED ORDER — GABAPENTIN 300 MG PO CAPS: 300.0000 mg | ORAL_CAPSULE | Freq: Three times a day (TID) | ORAL | Status: DC

## 2012-08-07 NOTE — Patient Instructions (Addendum)
We have discussed your MRI result today  We have discussed various treatment options.  I will increase your gabapentin 300 mg 3 times a day  I am starting you on Ultracet one to 2 times a day  You are being scheduled for an epidural steroid injection  I will see you back 2 weeks after your injection  Please do not drive until you know how you feel on the above medications.

## 2012-08-07 NOTE — Progress Notes (Signed)
Subjective:    Patient ID: Briana Parker, female    DOB: July 26, 1966, 46 y.o.   MRN: [id number]  HPIThe patient is a 46 year old woman who has a chief complaint of low back pain which has persisted for approximately 3 years getting worse.  Over the last 12 weeks has developed progressive right lower extremity pain radiating from the low back through her buttocks and into the right lower extremity. Pain has been worsening in this right lower extremity. She reports the pain in the leg as constant but wax is and wanes in intensity averaging between 8 and 9 on a scale of 10.  At the last visit education on spine care was done, physical therapy was recommended but declined due to financial constraints.  Patient is interested in an epidural steroid injection but would like to try noninvasive options before.  The patient recalled taking tramadol in the past after it was prescribed and indicated to me that he can cause some nausea last time she tried. She deferred using tramadol from last visit.  Financial constraints also precluded obtaining lumbar support  her chief complaint continues to be predominantly right low back pain which radiates to the right lower extremity.       Pain Inventory Average Pain 8 Pain Right Now 8 My pain is sharp and aching  In the last 24 hours, has pain interfered with the following? General activity 5 Relation with others 5 Enjoyment of life 4 What TIME of day is your pain at its worst? morning Sleep (in general) Fair  Pain is worse with: walking, bending, sitting, standing and some activites Pain improves with: heat/ice and therapy/exercise Relief from Meds: 5  Mobility walk without assistance how many minutes can you walk? 20 ability to climb steps?  yes do you drive?  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  yes CT/MRI  Physicians involved in your care Any changes since last visit?  no   Family History  Problem Relation Age of Onset  . Coronary artery disease      Strong family history in first degree relatives  . Diabetes Mother     died recently in 03-07-10 due to 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  She was on dialysis and died with cardiac arrest as per pt.  . Heart disease Father   . Hypertension Father    History   Social History  . Marital Status: Single    Spouse Name: N/A    Number of Children: N/A  . Years of Education: N/A   Social History Main Topics  . Smoking status: Never Smoker   . Smokeless tobacco: Never Used  . Alcohol Use: No  . Drug Use: No  . Sexually Active: None   Other Topics Concern  . None   Social History Narrative   Exercises Regularly, currently takes care of one child.  Lives at home with daughter.   Past Surgical History  Procedure Date  . Abdominal hysterectomy    Past Medical History  Diagnosis Date  . Fibromyalgia   . Hyperlipidemia   . Low back pain   . Allergic rhinitis    BP 119/71  Pulse 84  Resp 14  Ht 5\' 2"  (1.575 m)  Wt 137 lb (62.143 kg)  BMI 25.06 kg/m2  SpO2 98%    Review  of Systems  Genitourinary:       Bladder control at night  Musculoskeletal: Positive for back pain.  All other systems reviewed and are negative.       Objective:   Physical Exam  The patient is a well-developed well-nourished African American woman who does not appear in any distress  She is oriented x3 her speech is clear her affect is bright she's alert cooperative and pleasant  She follows commands without difficulty answers questions appropriately  Cranial nerves are grossly intact  Coordination is intact  Reflexes are 1+ at the patellar tendons 2+ at the Achilles tendons  There is no abnormal tone clonus or tremors  She reports intact sensation to light touch in both lower extremities without obvious  focal deficit  Motor strength is generally good she has slightly decreased hip abduction on the left. But it is in the 4+ over 5 range.  Straight leg raise is negative  She transitions easily from sitting to standing. There are no pain behaviors  Forward flexion increases lumbar pain and slightly increased pain into right lower extremity posteriorly and slightly lateral in the right leg.  Extension bothers her somewhat but not as much  Heel toe walking, tandem gait, Romberg test performed adequately  Minimal tenderness noted over sacroiliac joint  Lumbar paraspinal muscle tenderness noted in lower lumbar segments  She has no pain with internal or external rotation at the hips however she is significantly decreased internal rotation at the left hip compared to the right.         Assessment & Plan:  1.Worsening right lower extremity pain/chronic low back pain.  Plan: Would like to emphasize physical therapy however patient declines this secondary to financial constraints. Would like to try noninvasive options but is also open to epidural steroid injection.  MRI is reviewed shows disc herniation at L4-5 right-sided.   Medrol dose pack helped a little, gabapentin at night also help a little..  Patient does not have any bowel or bladder symptoms, no lower extremity weakness or sensory findings. Pain is main complaint especially right lower extremity.  Treatment options were discussed, patient wishes to pursue epidural steroid injection.  Will increase her gabapentin 300 mg 3 times a day and will add Ultracet twice a day.  Followup with me 2 weeks after epidural steroid injection.

## 2012-08-20 ENCOUNTER — Encounter: Payer: Self-pay | Admitting: *Deleted

## 2012-08-27 ENCOUNTER — Encounter: Payer: Self-pay | Attending: Physical Medicine and Rehabilitation

## 2012-08-27 ENCOUNTER — Encounter: Payer: Self-pay | Admitting: Physical Medicine & Rehabilitation

## 2012-08-27 ENCOUNTER — Ambulatory Visit (HOSPITAL_BASED_OUTPATIENT_CLINIC_OR_DEPARTMENT_OTHER): Payer: Self-pay | Admitting: Physical Medicine & Rehabilitation

## 2012-08-27 VITALS — BP 132/68 | HR 84 | Resp 14 | Ht 62.0 in | Wt 138.0 lb

## 2012-08-27 DIAGNOSIS — IMO0002 Reserved for concepts with insufficient information to code with codable children: Secondary | ICD-10-CM

## 2012-08-27 DIAGNOSIS — M5416 Radiculopathy, lumbar region: Secondary | ICD-10-CM

## 2012-08-27 DIAGNOSIS — M545 Low back pain, unspecified: Secondary | ICD-10-CM | POA: Insufficient documentation

## 2012-08-27 NOTE — Progress Notes (Signed)
  PROCEDURE RECORD [facility]   Name: Briana Parker DOB:07-30-1966 MRN: [id number]  Date:08/27/2012  Physician: Claudette Laws, MD    Nurse/CMA: Kelli Churn, CMA  Allergies:  Allergies  Allergen Reactions  . Codeine     REACTION: unknown reaction  . Pravastatin Sodium     REACTION: Pruritis and muscle pain  . Simvastatin     REACTION: Abdominal pain, nausea, vomitting    Consent Signed: yes  Is patient diabetic? no   Pregnant: no LMP: No LMP recorded. Patient has had a hysterectomy. (age 52-55)  Anticoagulants: no Anti-inflammatory: no Antibiotics: no  Procedure: Lumbar epidural steroid injection  Position: Prone Start Time:  2:30pm End Time: 2:38pm  Fluoro Time: 17  RN/CMA Rylen Swindler, CMA Isbella Arline,CMA    Time 1:53pm 2:40pm    BP 132/68 131/81    Pulse 82 77    Respirations 14 14    O2 Sat 100 99    S/S 6 6    Pain Level 8 6     D/C home with James-fiance, patient A & O X 3, D/C instructions reviewed, and sits independently.

## 2012-08-27 NOTE — Patient Instructions (Signed)
Epidural Steroid Injection Care After  Refer to this sheet in the next few weeks. These instructions provide you with information on caring for yourself after your procedure. Your caregiver may also give you more specific instructions. Your treatment has been planned according to current medical practices, but problems sometimes occur. Call your caregiver if you have any problems or questions after your procedure. HOME CARE INSTRUCTIONS   Avoid the use of heat on the injection site for a day.  Do not have a tub bath or soak in water for the rest of the day.  Remove the bandage on the next day.  Resume your normal activities on the next day.  Use ice packs or mild pain relievers to reduce the soreness around the injection site.  Follow up with your caregiver 7 to 10 days after the procedure. SEEK MEDICAL CARE IF:   You develop a fever of more than 100.5 F (38.1 C).  You continue to have pain and soreness over the injection site even after taking medicines.  You develop significant nausea or vomiting. SEEK IMMEDIATE MEDICAL CARE IF:   You have severe back pain, which is not relieved by medicines.  You develop severe headache, stiff neck, or sensitivity to light.  You develop any new numbness or weakness of your legs.  You lose control over your bladder or bowel movements.  You develop a fever of more than 102 F (38.9 C).  You develop difficulty breathing. Document Released: 12/13/2010 Document Revised: 11/20/2011 Document Reviewed: 12/13/2010 Exit[employer] Patient Information 2013 [employer].  

## 2012-08-27 NOTE — Progress Notes (Signed)
MRI 07/25/2012 1. Large right paracentral disc protrusion at L4-5 results in  moderate right lateral recess narrowing, displacing the right L5  nerve root.  2. Mild right foraminal narrowing at L4-5.  3. Mild left foraminal stenosis at L5-S1.  Lumbar Right L4-L5transforaminal epidural steroid injection under fluoroscopic guidance  Indication: Lumbosacral radiculitis is not relieved by medication management or other conservative care and interfering with self-care and mobility.   Informed consent was obtained after describing risk and benefits of the procedure with the patient, this includes bleeding, bruising, infection, paralysis and medication side effects.  The patient wishes to proceed and has given written consent.  Patient was placed in prone position.  The lumbar area was marked and prepped with Betadine.  It was entered with a 25-gauge 1-1/2 inch needle and one mL of 1% lidocaine was injected into the skin and subcutaneous tissue.  Then a 22-gauge 3.5 spinal needle was inserted into the R L4-5 intervertebral foramen under AP, lateral, and oblique view.  Then a solution containing one mL of 10 mg per mL dexamethasone and 2 mL of 1% lidocaine was injected.  The patient tolerated procedure well.  Post procedure instructions were given.  Please see post procedure form.

## 2012-08-30 ENCOUNTER — Telehealth: Payer: Self-pay | Admitting: *Deleted

## 2012-08-30 NOTE — Telephone Encounter (Signed)
Patient called stating she was seen a few weeks ago by Dr. Pamelia Hoit. She increased her Gabapentin medication. Patient took her prescription to the pharmacy for them to put on hold. Went up there to pick up her prescription and they said it was the same dose she has been taking.

## 2012-08-30 NOTE — Telephone Encounter (Signed)
Called and notified patient that I spoke with the pharmacist at [facility]. They did have her prescription on file for the increased dose.

## 2012-09-02 ENCOUNTER — Other Ambulatory Visit: Payer: Self-pay | Admitting: *Deleted

## 2012-09-02 DIAGNOSIS — G8929 Other chronic pain: Secondary | ICD-10-CM

## 2012-09-02 NOTE — Telephone Encounter (Signed)
I don't believe doxepin is on the [facility] $4 list. She was given 3 refills for flexeril on 07/02/2012, so she shouldn't need any refills of this medication. Regarding her statin, she has an allergy to pravastatin, which is the only statin on the $4 list at [facility] that I would recommend using. Simvastatin is on the $4 list at [facility], but she has an allergy to this also. Unfortunately, crestor is not a cheap medication, but she seems to tolerate it better than other higher-dose, lower-potency statins.

## 2012-09-09 ENCOUNTER — Other Ambulatory Visit: Payer: Self-pay | Admitting: *Deleted

## 2012-09-09 NOTE — Telephone Encounter (Signed)
Pt needs a written script for both of these. She is sending them to a program that supplies them for free

## 2012-09-09 NOTE — Telephone Encounter (Signed)
No ID on home phone recording - tried cell [phone] and left Dr Lavena Bullion answer about $4.00 med. Suggest may try [facility] at [facility].

## 2012-09-10 MED ORDER — ESOMEPRAZOLE MAGNESIUM 40 MG PO CPDR: 40.0000 mg | DELAYED_RELEASE_CAPSULE | Freq: Every day | ORAL | Status: DC

## 2012-09-10 MED ORDER — ROSUVASTATIN CALCIUM 5 MG PO TABS: 5.0000 mg | ORAL_TABLET | Freq: Every day | ORAL | Status: DC

## 2012-09-17 ENCOUNTER — Other Ambulatory Visit: Payer: Self-pay | Admitting: Radiation Oncology

## 2012-09-17 MED ORDER — ROSUVASTATIN CALCIUM 5 MG PO TABS: 5.0000 mg | ORAL_TABLET | Freq: Every day | ORAL | Status: DC

## 2012-09-17 MED ORDER — ESOMEPRAZOLE MAGNESIUM 40 MG PO CPDR: 40.0000 mg | DELAYED_RELEASE_CAPSULE | Freq: Every day | ORAL | Status: DC

## 2012-09-17 NOTE — Telephone Encounter (Signed)
Both Rx mailed to pt - pt aware. Stanton Kidney Jasmain Ahlberg RN 09/17/12 4:15PM

## 2012-09-24 ENCOUNTER — Telehealth: Payer: Self-pay | Admitting: *Deleted

## 2012-09-24 NOTE — Telephone Encounter (Signed)
Patient had back injection by Dr. Wynn Banker on 12/17. Still not having any relief of her pain. Was advised to make follow up appointment after injection. Patient would like to know what else can be done to help with her pain? Advised patient to schedule follow up appointment with Dr. Pamelia Hoit to discuss other options.

## 2012-10-15 ENCOUNTER — Ambulatory Visit: Payer: Self-pay

## 2012-10-16 ENCOUNTER — Encounter: Payer: Self-pay | Admitting: Internal Medicine

## 2012-10-16 ENCOUNTER — Ambulatory Visit (INDEPENDENT_AMBULATORY_CARE_PROVIDER_SITE_OTHER): Payer: No Typology Code available for payment source | Admitting: Internal Medicine

## 2012-10-16 VITALS — BP 109/72 | HR 98 | Temp 98.6°F | Ht 62.0 in | Wt 138.8 lb

## 2012-10-16 DIAGNOSIS — M545 Low back pain: Secondary | ICD-10-CM

## 2012-10-16 DIAGNOSIS — K029 Dental caries, unspecified: Secondary | ICD-10-CM

## 2012-10-16 DIAGNOSIS — Z79899 Other long term (current) drug therapy: Secondary | ICD-10-CM

## 2012-10-16 DIAGNOSIS — Z0000 Encounter for general adult medical examination without abnormal findings: Secondary | ICD-10-CM

## 2012-10-16 DIAGNOSIS — G4700 Insomnia, unspecified: Secondary | ICD-10-CM

## 2012-10-16 DIAGNOSIS — J45909 Unspecified asthma, uncomplicated: Secondary | ICD-10-CM

## 2012-10-16 DIAGNOSIS — G8929 Other chronic pain: Secondary | ICD-10-CM

## 2012-10-16 DIAGNOSIS — E785 Hyperlipidemia, unspecified: Secondary | ICD-10-CM

## 2012-10-16 LAB — COMPLETE METABOLIC PANEL WITH GFR
Alkaline Phosphatase: 69 U/L (ref 39–117)
BUN: 12 mg/dL (ref 6–23)
Creat: 0.91 mg/dL (ref 0.50–1.10)
GFR, Est African American: 86 mL/min
GFR, Est Non African American: 75 mL/min
Glucose, Bld: 86 mg/dL (ref 70–99)
Sodium: 137 mEq/L (ref 135–145)
Total Bilirubin: 0.4 mg/dL (ref 0.3–1.2)

## 2012-10-16 LAB — LIPID PANEL
Cholesterol: 201 mg/dL — ABNORMAL HIGH (ref 0–200)
HDL: 58 mg/dL (ref >39)
Total CHOL/HDL Ratio: 3.5 Ratio
Triglycerides: 99 mg/dL (ref <150)

## 2012-10-16 LAB — POCT GLYCOSYLATED HEMOGLOBIN (HGB A1C): Hemoglobin A1C: 5.9

## 2012-10-16 LAB — GLUCOSE, CAPILLARY: Glucose-Capillary: 89 mg/dL (ref 70–99)

## 2012-10-16 MED ORDER — ALBUTEROL SULFATE HFA 108 (90 BASE) MCG/ACT IN AERS: 2.0000 | INHALATION_SPRAY | Freq: Four times a day (QID) | RESPIRATORY_TRACT | Status: DC | PRN

## 2012-10-16 MED ORDER — CYCLOBENZAPRINE HCL 10 MG PO TABS: 10.0000 mg | ORAL_TABLET | Freq: Three times a day (TID) | ORAL | Status: DC | PRN

## 2012-10-16 MED ORDER — DOXEPIN HCL 25 MG PO CAPS: 25.0000 mg | ORAL_CAPSULE | Freq: Every day | ORAL | Status: DC

## 2012-10-16 NOTE — Assessment & Plan Note (Signed)
Patient has lot of caries both sides upper molars and a few lower molars. No signs of infection/abscess on exam. -Dental referral for further evaluation and cleaning.

## 2012-10-16 NOTE — Progress Notes (Signed)
Subjective:   Patient ID: Briana Parker female   DOB: 24-Oct-1965 47 y.o.   MRN: [id number]  HPI: 47 year old woman with past medical history significant for chronic low back pain presents to the clinic for getting a dental referral.  Patient has poor dentition with lot of dental caries and was requesting a dental referral for further treatment and cleaning. Denies any dental pain or halitosis at this time.  She is requesting to be screened for diabetes given the family history. Also requesting medication refills.   Past Medical History  Diagnosis Date  . Fibromyalgia   . Hyperlipidemia   . Low back pain   . Allergic rhinitis    Family History  Problem Relation Age of Onset  . Coronary artery disease      Strong family history in first degree relatives  . Diabetes Mother     died recently in 01/25/10 due to 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  She was on dialysis and died with cardiac arrest as per pt.  . Heart disease Father   . Hypertension Father    History   Social History  . Marital Status: Single    Spouse Name: N/A    Number of Children: N/A  . Years of Education: N/A   Occupational History  . Not on file.   Social History Main Topics  . Smoking status: Never Smoker   . Smokeless tobacco: Never Used  . Alcohol Use: No  . Drug Use: No  . Sexually Active: Not on file   Other Topics Concern  . Not on file   Social History Narrative   Exercises Regularly, currently takes care of one child.  Lives at home with daughter.   Review of Systems: General: Denies fever, chills, diaphoresis, appetite change and fatigue. HEENT: Denies photophobia, eye pain, redness, hearing loss, ear pain, congestion, sore throat, rhinorrhea, sneezing, mouth sores, trouble swallowing, neck pain, neck stiffness and tinnitus. Respiratory: Denies SOB, DOE, cough, chest tightness, and wheezing. Cardiovascular: Denies to chest pain, palpitations and leg swelling. Gastrointestinal: Denies nausea, vomiting,  abdominal pain, diarrhea, constipation, blood in stool and abdominal distention. Genitourinary: Denies dysuria, urgency, frequency, hematuria, flank pain and difficulty urinating. Musculoskeletal: Denies myalgias,  joint swelling, arthralgias and gait problem, + back pain,.  Skin: Denies pallor, rash and wound. Neurological: Denies dizziness, seizures, syncope, weakness, light-headedness, numbness and headaches. Hematological: Denies adenopathy, easy bruising, personal or family bleeding history. Psychiatric/Behavioral: Denies suicidal ideation, mood changes, confusion, nervousness, sleep disturbance and agitation.    Current Outpatient Medications: Current Outpatient Prescriptions  Medication Sig Dispense Refill  . albuterol (PROVENTIL HFA;VENTOLIN HFA) 108 (90 BASE) MCG/ACT inhaler Inhale 2 puffs into the lungs 4 (four) times daily as needed.        . carboxymethylcellulose (LUBRICANT EYE DROPS) 0.5 % SOLN Place 2 drops into both eyes 3 (three) times daily as needed.  15 mL  0  . cyclobenzaprine (FLEXERIL) 10 MG tablet Take 1 tablet (10 mg total) by mouth 3 (three) times daily as needed.  90 tablet  3  . doxepin (SINEQUAN) 25 MG capsule Take 1 capsule (25 mg total) by mouth at bedtime.  60 capsule  3  . esomeprazole (NEXIUM) 40 MG capsule Take 1 capsule (40 mg total) by mouth daily before breakfast.  90 capsule  2  . fish oil-omega-3 fatty acids 1000 MG capsule Take 2 g by mouth daily. Use as directed       . gabapentin (NEURONTIN) 300 MG capsule Take 1 capsule (  300 mg total) by mouth 3 (three) times daily.  90 capsule  1  . rosuvastatin (CRESTOR) 5 MG tablet Take 1 tablet (5 mg total) by mouth daily.  90 tablet  2  . senna-docusate (SENOKOT-S) 8.6-50 MG per tablet Take 1 tablet by mouth daily. Take one tablet every 12 hours as needed for constipation       . traMADol-acetaminophen (ULTRACET) 37.5-325 MG per tablet Take 1 tablet by mouth 2 (two) times daily.  60 tablet  0     Allergies: Allergies  Allergen Reactions  . Codeine     REACTION: unknown reaction  . Pravastatin Sodium     REACTION: Pruritis and muscle pain  . Simvastatin     REACTION: Abdominal pain, nausea, vomitting      Objective:   Physical Exam: Filed Vitals:   10/16/12 0848  BP: 109/72  Pulse: 98  Temp: 98.6 F (37 C)    General: Vital signs reviewed and noted. Well-developed, well-nourished, in no acute distress; alert, appropriate and cooperative throughout examination. HEENT: Normocephalic, atraumatic, poor dentition, dental caries involving both sides upper molars and few lower molars Lungs: Normal respiratory effort. Clear to auscultation BL without crackles or wheezes. Heart: RRR. S1 and S2 normal without gallop, murmur, or rubs. Abdomen:BS normoactive. Soft, Nondistended, non-tender.  No masses or organomegaly. Extremities: No pretibial edema.     Assessment & Plan:

## 2012-10-16 NOTE — Assessment & Plan Note (Signed)
Recheck lipid panel and CMET today. So far she just has one risk factor of family history of coronary artery disease in first degree relative. Based on the LDL level  at which statin therapy needs to be initiated is 190 mg/dl.  - Would review the results to determine if she needs to be on crestor.

## 2012-10-16 NOTE — Assessment & Plan Note (Signed)
Reports off and on worsening with increased activity. She was offered physical therapy referral but she refused. Her Flexeril was refilled

## 2012-10-16 NOTE — Patient Instructions (Addendum)
General Instructions: Please schedule a follow up appointment in 3 months or sooner if needed . Please bring your medication bottles with your next appointment. Please take your medicines as prescribed. I will call you with your lab results if anything will be abnormal.    Treatment Goals:  Goals (1 Years of Data) as of 10/16/2012    None           Care Management & Community Referrals: None

## 2012-10-17 ENCOUNTER — Encounter: Payer: Self-pay | Admitting: Internal Medicine

## 2012-10-17 LAB — CBC
HCT: 41.4 % (ref 36.0–46.0)
Hemoglobin: 14.4 g/dL (ref 12.0–15.0)
RBC: 4.87 MIL/uL (ref 3.87–5.11)
WBC: 3.9 10*3/uL — ABNORMAL LOW (ref 4.0–10.5)

## 2012-11-01 ENCOUNTER — Telehealth: Payer: Self-pay | Admitting: *Deleted

## 2012-11-01 NOTE — Telephone Encounter (Signed)
Pt calls and ask if the doxepin can be changed to a cheaper med or something on the health dept formulary, she cannot afford this med out of pocket and has lost her job, she takes it for itching at night and anxiousness at night. Please advise

## 2012-11-02 ENCOUNTER — Other Ambulatory Visit: Payer: Self-pay | Admitting: Radiation Oncology

## 2012-11-02 MED ORDER — DIPHENHYDRAMINE HCL 25 MG PO CAPS: 25.0000 mg | ORAL_CAPSULE | Freq: Every evening | ORAL | Status: DC | PRN

## 2012-11-02 NOTE — Telephone Encounter (Signed)
I discontinued doxepin and ordered benadryl, which should address these issues adequately.

## 2012-12-25 ENCOUNTER — Encounter: Payer: Self-pay | Admitting: Radiation Oncology

## 2012-12-25 ENCOUNTER — Other Ambulatory Visit: Payer: Self-pay | Admitting: *Deleted

## 2012-12-25 ENCOUNTER — Ambulatory Visit (INDEPENDENT_AMBULATORY_CARE_PROVIDER_SITE_OTHER): Payer: No Typology Code available for payment source | Admitting: Radiation Oncology

## 2012-12-25 VITALS — BP 124/88 | HR 100 | Temp 97.9°F | Ht 62.0 in | Wt 136.0 lb

## 2012-12-25 DIAGNOSIS — G4700 Insomnia, unspecified: Secondary | ICD-10-CM

## 2012-12-25 DIAGNOSIS — G8929 Other chronic pain: Secondary | ICD-10-CM

## 2012-12-25 DIAGNOSIS — R252 Cramp and spasm: Secondary | ICD-10-CM

## 2012-12-25 DIAGNOSIS — M545 Other chronic pain: Secondary | ICD-10-CM

## 2012-12-25 DIAGNOSIS — J45990 Exercise induced bronchospasm: Secondary | ICD-10-CM

## 2012-12-25 DIAGNOSIS — J45909 Unspecified asthma, uncomplicated: Secondary | ICD-10-CM

## 2012-12-25 MED ORDER — CYCLOBENZAPRINE HCL 10 MG PO TABS: 10.0000 mg | ORAL_TABLET | Freq: Three times a day (TID) | ORAL | Status: DC | PRN

## 2012-12-25 MED ORDER — IBUPROFEN 600 MG PO TABS: 600.0000 mg | ORAL_TABLET | Freq: Four times a day (QID) | ORAL | Status: DC | PRN

## 2012-12-25 MED ORDER — DOXEPIN HCL 25 MG PO CAPS: 25.0000 mg | ORAL_CAPSULE | Freq: Every day | ORAL | Status: DC

## 2012-12-25 MED ORDER — GABAPENTIN 300 MG PO CAPS: 300.0000 mg | ORAL_CAPSULE | Freq: Three times a day (TID) | ORAL | Status: DC

## 2012-12-25 MED ORDER — ALBUTEROL SULFATE HFA 108 (90 BASE) MCG/ACT IN AERS: 2.0000 | INHALATION_SPRAY | Freq: Four times a day (QID) | RESPIRATORY_TRACT | Status: DC | PRN

## 2012-12-25 NOTE — Progress Notes (Signed)
Subjective:    Patient ID: Enid Baas, female    DOB: [birth year], 47 y.o.   MRN: [id number]  HPI Patient is a 47 year old woman with PMH as described below who presents to clinic today for medication refills. Her chronic back pain persists unchanged, which occurs at midline in the lumbar back with radiation to the bilateral lower extremities in a sciatic distribution and is more severe on the right.   The patient has no new complaints today.  Review of Systems  Constitutional: Negative for fever and chills.  HENT: Negative for neck pain and neck stiffness.   Eyes: Negative.   Respiratory: Negative for cough and shortness of breath.   Cardiovascular: Negative for chest pain and leg swelling.  Gastrointestinal: Negative for nausea, vomiting and diarrhea.  Genitourinary: Negative.   Musculoskeletal: Positive for back pain. Negative for arthralgias and gait problem.  Neurological: Negative for weakness and numbness.  Hematological: Negative.   Psychiatric/Behavioral: Negative.   All other systems reviewed and are negative.   Current Outpatient Medications: Current Outpatient Prescriptions  Medication Sig Dispense Refill  . albuterol (PROVENTIL HFA;VENTOLIN HFA) 108 (90 BASE) MCG/ACT inhaler Inhale 2 puffs into the lungs 4 (four) times daily as needed.  1 Inhaler  3  . carboxymethylcellulose (LUBRICANT EYE DROPS) 0.5 % SOLN Place 2 drops into both eyes 3 (three) times daily as needed.  15 mL  0  . cyclobenzaprine (FLEXERIL) 10 MG tablet Take 1 tablet (10 mg total) by mouth 3 (three) times daily as needed.  90 tablet  5  . doxepin (SINEQUAN) 25 MG capsule Take 1 capsule (25 mg total) by mouth at bedtime.  30 capsule  5  . esomeprazole (NEXIUM) 40 MG capsule Take 1 capsule (40 mg total) by mouth daily before breakfast.  90 capsule  2  . fish oil-omega-3 fatty acids 1000 MG capsule Take 2 g by mouth daily. Use as directed       . gabapentin (NEURONTIN) 300 MG capsule Take 1 capsule (300 mg  total) by mouth 3 (three) times daily.  90 capsule  1  . ibuprofen (MOTRIN IB) 600 MG tablet Take 1 tablet (600 mg total) by mouth every 6 (six) hours as needed for pain.  100 tablet  2  . rosuvastatin (CRESTOR) 5 MG tablet Take 1 tablet (5 mg total) by mouth daily.  90 tablet  2  . senna-docusate (SENOKOT-S) 8.6-50 MG per tablet Take 1 tablet by mouth daily. Take one tablet every 12 hours as needed for constipation        No current facility-administered medications for this visit.    Allergies: Allergies  Allergen Reactions  . Codeine     REACTION: unknown reaction  . Pravastatin Sodium     REACTION: Pruritis and muscle pain  . Simvastatin     REACTION: Abdominal pain, nausea, vomitting     Past Medical History: Past Medical History  Diagnosis Date  . Fibromyalgia   . Hyperlipidemia   . Low back pain   . Allergic rhinitis     Past Surgical History: Past Surgical History  Procedure Laterality Date  . Abdominal hysterectomy      Family History: Family History  Problem Relation Age of Onset  . Coronary artery disease      Strong family history in first degree relatives  . Diabetes Mother     died recently in Jan 31, 2010 due to 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  She was on dialysis and died with cardiac arrest as  per pt.  . Heart disease Father   . Hypertension Father     Social History: History   Social History  . Marital Status: Single    Spouse Name: N/A    Number of Children: N/A  . Years of Education: N/A   Occupational History  . Not on file.   Social History Main Topics  . Smoking status: Never Smoker   . Smokeless tobacco: Never Used  . Alcohol Use: No  . Drug Use: No  . Sexually Active: Not on file   Other Topics Concern  . Not on file   Social History Narrative   Exercises Regularly, currently takes care of one child.  Lives at home with daughter.     Vital Signs: Blood pressure 124/88, pulse 100, temperature 97.9 F (36.6 C), temperature source Oral,  height 5\' 2"  (1.575 m), weight 136 lb (61.689 kg), SpO2 99.00%.      Objective:   Physical Exam  Constitutional: She is oriented to person, place, and time. She appears well-developed and well-nourished. No distress.  HENT:  Head: Normocephalic and atraumatic.  Eyes: Conjunctivae are normal. Pupils are equal, round, and reactive to light. No scleral icterus.  Neck: Normal range of motion. Neck supple. No tracheal deviation present.  Cardiovascular: Normal rate and regular rhythm.   No murmur heard. Pulmonary/Chest: Effort normal. She has no wheezes. She has no rales.  Abdominal: Soft. Bowel sounds are normal. She exhibits no distension. There is no tenderness.  Musculoskeletal: Normal range of motion. She exhibits no edema.  Neurological: She is alert and oriented to person, place, and time. No cranial nerve deficit.  Skin: Skin is warm and dry. No erythema.  Psychiatric: Her behavior is normal. Her mood appears anxious.          Assessment & Plan:

## 2012-12-25 NOTE — Telephone Encounter (Signed)
Pt calls and ask for refills of inhalers, one of which is not on her med list- combivent, she was f/u w/ pcp may, placed in pcp schedule today dr Lavena Bullion

## 2012-12-25 NOTE — Patient Instructions (Addendum)
Take ibuprofen as needed for pain as we discussed during your visit today.   Continue to attend your visits at the pain management center for treatment of your lower back pain.   Please continue all other medications as previously prescribed.  It was a pleasure to meet you. Have a great day.

## 2012-12-26 NOTE — Assessment & Plan Note (Signed)
No changes in quality or severity of symptoms. No red flag symptoms. Patient is agreeable to continuing to have her pain managed by the pain center, however she states ibuprofen has worked well for her in the past, thus we will restart this today.  - ibuprofen 600mg  q6h PRN - cont gabapentin - cont flexeril - cont visits with pain management

## 2012-12-26 NOTE — Assessment & Plan Note (Signed)
Stable. No recent exacerbations. Although she had a previous prescription for a combivent inhaler, she cannot afford this and she likely only needs an albuterol inhaler given her asthma is mild and intermittent.  - albuterol PRN

## 2012-12-26 NOTE — Progress Notes (Signed)
Case discussed with Dr. McTyre at the time of the visit. We reviewed the resident's history and exam and pertinent patient test results. I agree with the assessment, diagnosis and plan of care documented in the resident's note. 

## 2013-01-07 ENCOUNTER — Telehealth: Payer: Self-pay | Admitting: *Deleted

## 2013-01-07 NOTE — Telephone Encounter (Signed)
What is the cost of an albuterol inhaler? Given she has asthma and not COPD (and symptoms are mild and intermittent), I'd prefer to stick with albuterol rather than a combo med such as combivent. Thanks for your help with this.

## 2013-01-07 NOTE — Telephone Encounter (Signed)
[facility] is requesting Rx  on Combivent respimat - written Rx 01/06/13. This would be free for patient through [facility]. Stanton Kidney Aldrick Derrig RN 01/07/13 3PM

## 2013-01-27 ENCOUNTER — Other Ambulatory Visit: Payer: Self-pay | Admitting: *Deleted

## 2013-01-28 MED ORDER — ESOMEPRAZOLE MAGNESIUM 40 MG PO CPDR: 40.0000 mg | DELAYED_RELEASE_CAPSULE | Freq: Every day | ORAL | Status: DC

## 2013-01-28 NOTE — Telephone Encounter (Signed)
Rx called in to pharmacy. 

## 2013-03-04 ENCOUNTER — Other Ambulatory Visit: Payer: Self-pay | Admitting: Radiation Oncology

## 2013-03-04 MED ORDER — ROSUVASTATIN CALCIUM 5 MG PO TABS: 5.0000 mg | ORAL_TABLET | Freq: Every day | ORAL | Status: DC

## 2013-03-04 MED ORDER — ESOMEPRAZOLE MAGNESIUM 40 MG PO CPDR: 40.0000 mg | DELAYED_RELEASE_CAPSULE | Freq: Every day | ORAL | Status: DC

## 2013-03-20 ENCOUNTER — Other Ambulatory Visit: Payer: Self-pay | Admitting: *Deleted

## 2013-03-20 MED ORDER — ALBUTEROL SULFATE HFA 108 (90 BASE) MCG/ACT IN AERS: 2.0000 | INHALATION_SPRAY | Freq: Four times a day (QID) | RESPIRATORY_TRACT | Status: DC | PRN

## 2013-03-24 ENCOUNTER — Other Ambulatory Visit: Payer: Self-pay | Admitting: *Deleted

## 2013-03-24 MED ORDER — ROSUVASTATIN CALCIUM 5 MG PO TABS: 5.0000 mg | ORAL_TABLET | Freq: Every day | ORAL | Status: DC

## 2013-03-24 MED ORDER — ESOMEPRAZOLE MAGNESIUM 40 MG PO CPDR: 40.0000 mg | DELAYED_RELEASE_CAPSULE | Freq: Every day | ORAL | Status: DC

## 2013-03-24 NOTE — Telephone Encounter (Signed)
Rx called in to pharmacy. Stanton Kidney Faithanne Verret RN 03/24/13 9AM

## 2013-04-16 ENCOUNTER — Ambulatory Visit: Payer: Self-pay

## 2013-04-23 ENCOUNTER — Other Ambulatory Visit: Payer: Self-pay | Admitting: Internal Medicine

## 2013-04-23 DIAGNOSIS — Z1231 Encounter for screening mammogram for malignant neoplasm of breast: Secondary | ICD-10-CM

## 2013-04-30 ENCOUNTER — Ambulatory Visit (HOSPITAL_COMMUNITY)
Admission: RE | Admit: 2013-04-30 | Discharge: 2013-04-30 | Disposition: A | Discharge status: Home / Self Care | Payer: No Typology Code available for payment source | Source: Ambulatory Visit | Attending: Internal Medicine | Admitting: Internal Medicine

## 2013-04-30 DIAGNOSIS — Z1231 Encounter for screening mammogram for malignant neoplasm of breast: Secondary | ICD-10-CM | POA: Insufficient documentation

## 2013-05-29 ENCOUNTER — Encounter: Payer: Self-pay | Admitting: Internal Medicine

## 2013-05-29 ENCOUNTER — Ambulatory Visit (INDEPENDENT_AMBULATORY_CARE_PROVIDER_SITE_OTHER): Payer: No Typology Code available for payment source | Admitting: Internal Medicine

## 2013-05-29 VITALS — BP 119/76 | HR 74 | Temp 97.5°F | Ht 62.0 in | Wt 135.0 lb

## 2013-05-29 DIAGNOSIS — B373 Candidiasis of vulva and vagina: Secondary | ICD-10-CM | POA: Insufficient documentation

## 2013-05-29 DIAGNOSIS — N898 Other specified noninflammatory disorders of vagina: Secondary | ICD-10-CM

## 2013-05-29 DIAGNOSIS — M545 Low back pain: Secondary | ICD-10-CM

## 2013-05-29 DIAGNOSIS — E785 Hyperlipidemia, unspecified: Secondary | ICD-10-CM

## 2013-05-29 DIAGNOSIS — G8929 Other chronic pain: Secondary | ICD-10-CM

## 2013-05-29 DIAGNOSIS — Z23 Encounter for immunization: Secondary | ICD-10-CM

## 2013-05-29 DIAGNOSIS — J329 Chronic sinusitis, unspecified: Secondary | ICD-10-CM | POA: Insufficient documentation

## 2013-05-29 MED ORDER — FLUCONAZOLE 150 MG PO TABS: 150.0000 mg | ORAL_TABLET | Freq: Once | ORAL | Status: DC

## 2013-05-29 MED ORDER — CYCLOBENZAPRINE HCL 10 MG PO TABS: 10.0000 mg | ORAL_TABLET | Freq: Three times a day (TID) | ORAL | Status: DC | PRN

## 2013-05-29 MED ORDER — ESOMEPRAZOLE MAGNESIUM 40 MG PO CPDR: 40.0000 mg | DELAYED_RELEASE_CAPSULE | Freq: Every day | ORAL | Status: DC

## 2013-05-29 MED ORDER — FLUCONAZOLE 150 MG PO TABS: 150.0000 mg | ORAL_TABLET | Freq: Once | ORAL | Status: AC

## 2013-05-29 MED ORDER — ROSUVASTATIN CALCIUM 5 MG PO TABS: 5.0000 mg | ORAL_TABLET | Freq: Every day | ORAL | Status: DC

## 2013-05-29 NOTE — Patient Instructions (Signed)
1.  Take 1 tablet of fluconazole 150mg  for yeast infection. 2.  Continue to take Zyrtec and use steam treatment for sinusitis. 3.  Return or call in 1 week if not better.  Sinusitis Sinusitis is redness, soreness, and swelling (inflammation) of the paranasal sinuses. Paranasal sinuses are air pockets within the bones of your face (beneath the eyes, the middle of the forehead, or above the eyes). In healthy paranasal sinuses, mucus is able to drain out, and air is able to circulate through them by way of your nose. However, when your paranasal sinuses are inflamed, mucus and air can become trapped. This can allow bacteria and other germs to grow and cause infection. Sinusitis can develop quickly and last only a short time (acute) or continue over a long period (chronic). Sinusitis that lasts for more than 12 weeks is considered chronic.  CAUSES  Causes of sinusitis include:  Allergies.  Structural abnormalities, such as displacement of the cartilage that separates your nostrils (deviated septum), which can decrease the air flow through your nose and sinuses and affect sinus drainage.  Functional abnormalities, such as when the small hairs (cilia) that line your sinuses and help remove mucus do not work properly or are not present. SYMPTOMS  Symptoms of acute and chronic sinusitis are the same. The primary symptoms are pain and pressure around the affected sinuses. Other symptoms include:  Upper toothache.  Earache.  Headache.  Bad breath.  Decreased sense of smell and taste.  A cough, which worsens when you are lying flat.  Fatigue.  Fever.  Thick drainage from your nose, which often is green and may contain pus (purulent).  Swelling and warmth over the affected sinuses. DIAGNOSIS  Your caregiver will perform a physical exam. During the exam, your caregiver may:  Look in your nose for signs of abnormal growths in your nostrils (nasal polyps).  Tap over the affected sinus to  check for signs of infection.  View the inside of your sinuses (endoscopy) with a special imaging device with a light attached (endoscope), which is inserted into your sinuses. If your caregiver suspects that you have chronic sinusitis, one or more of the following tests may be recommended:  Allergy tests.  Nasal culture A sample of mucus is taken from your nose and sent to a lab and screened for bacteria.  Nasal cytology A sample of mucus is taken from your nose and examined by your caregiver to determine if your sinusitis is related to an allergy. TREATMENT  Most cases of acute sinusitis are related to a viral infection and will resolve on their own within 10 days. Sometimes medicines are prescribed to help relieve symptoms (pain medicine, decongestants, nasal steroid sprays, or saline sprays).  However, for sinusitis related to a bacterial infection, your caregiver will prescribe antibiotic medicines. These are medicines that will help kill the bacteria causing the infection.  Rarely, sinusitis is caused by a fungal infection. In theses cases, your caregiver will prescribe antifungal medicine. For some cases of chronic sinusitis, surgery is needed. Generally, these are cases in which sinusitis recurs more than 3 times per year, despite other treatments. HOME CARE INSTRUCTIONS   Drink plenty of water. Water helps thin the mucus so your sinuses can drain more easily.  Use a humidifier.  Inhale steam 3 to 4 times a day (for example, sit in the bathroom with the shower running).  Apply a warm, moist washcloth to your face 3 to 4 times a day, or as directed  by your caregiver.  Use saline nasal sprays to help moisten and clean your sinuses.  Take over-the-counter or prescription medicines for pain, discomfort, or fever only as directed by your caregiver. SEEK IMMEDIATE MEDICAL CARE IF:  You have increasing pain or severe headaches.  You have nausea, vomiting, or drowsiness.  You have  swelling around your face.  You have vision problems.  You have a stiff neck.  You have difficulty breathing. MAKE SURE YOU:   Understand these instructions.  Will watch your condition.  Will get help right away if you are not doing well or get worse. Document Released: 08/28/2005 Document Revised: 11/20/2011 Document Reviewed: 09/12/2011 [facility] Patient Information 2014 [place], [place].

## 2013-05-29 NOTE — Assessment & Plan Note (Signed)
Patient reports vaginal itching and white cheese like discharge for the past week.  Likely a yeast infection however patient will not agree to vaginal exam at this time.  Will treat presumptive yeast infection with fluconazole, however patient instructed to call and make appointment in one week if symptoms do not improve and she will need vaginal exam with samples obtained.

## 2013-05-29 NOTE — Assessment & Plan Note (Signed)
Continue crestor 

## 2013-05-29 NOTE — Assessment & Plan Note (Signed)
Low back pain unchanged, no red flag symptoms.  Patient takes flexeril TIDPRN for muscle spasms, will continue.

## 2013-05-29 NOTE — Assessment & Plan Note (Addendum)
Patient presents with signs and symptoms of sinusitis. She reports this happens to her frequently when the seasons change. She does report some yellowish greenish discharge but denies any fever or chills. Patient does take Zyrtec at home and has had only minimal relief with Mucinex.patient instructed to continue taking Zyrtec and to try conservative treatment. If this does not improve in one week patient instructed to call back and an antibiotic may need to be prescribed.

## 2013-05-29 NOTE — Progress Notes (Signed)
Patient ID: Briana Parker, female   DOB: Jul 13, 1966, 47 y.o.   MRN: [id number]   Subjective:   Patient ID: Briana Parker female   DOB: 01-05-1966 47 y.o.   MRN: [id number]  HPI: Briana Parker is a 47 y.o. female with PMH of HTN, HLD, allergic rhinitis. She presents today for regular followup. She reports she needs a refill on her medications and reports no issues with her meds. In addition she complains of some frontal and maxillary sinus pain and occasional coughing up yellowish green sputum for the past one to 2 weeks. She reports this happens frequently when the seasons change and she has been taking Zyrtec. She denies any fever or chills. In addition she reports she has had some white cheeselike vaginal discharge and vaginal itching for the past week. She believes it to be a yeast infection as it is similar to previous ones. She denies any high-risk sexual behavior. She recently got married and has only one sexual partner, no history of STDs.    Past Medical History  Diagnosis Date  . Fibromyalgia   . Hyperlipidemia   . Low back pain   . Allergic rhinitis    Current Outpatient Prescriptions  Medication Sig Dispense Refill  . albuterol (PROVENTIL HFA;VENTOLIN HFA) 108 (90 BASE) MCG/ACT inhaler Inhale 2 puffs into the lungs 4 (four) times daily as needed.  1 Inhaler  3  . carboxymethylcellulose (LUBRICANT EYE DROPS) 0.5 % SOLN Place 2 drops into both eyes 3 (three) times daily as needed.  15 mL  0  . cyclobenzaprine (FLEXERIL) 10 MG tablet Take 1 tablet (10 mg total) by mouth 3 (three) times daily as needed.  90 tablet  5  . doxepin (SINEQUAN) 25 MG capsule Take 1 capsule (25 mg total) by mouth at bedtime.  30 capsule  5  . esomeprazole (NEXIUM) 40 MG capsule Take 1 capsule (40 mg total) by mouth daily before breakfast.  90 capsule  2  . fish oil-omega-3 fatty acids 1000 MG capsule Take 2 g by mouth daily. Use as directed       . fluconazole (DIFLUCAN) 150 MG tablet Take 1  tablet (150 mg total) by mouth once.  30 tablet  0  . gabapentin (NEURONTIN) 300 MG capsule Take 1 capsule (300 mg total) by mouth 3 (three) times daily.  90 capsule  1  . ibuprofen (MOTRIN IB) 600 MG tablet Take 1 tablet (600 mg total) by mouth every 6 (six) hours as needed for pain.  100 tablet  2  . rosuvastatin (CRESTOR) 5 MG tablet Take 1 tablet (5 mg total) by mouth daily.  90 tablet  2  . senna-docusate (SENOKOT-S) 8.6-50 MG per tablet Take 1 tablet by mouth daily. Take one tablet every 12 hours as needed for constipation        No current facility-administered medications for this visit.   Family History  Problem Relation Age of Onset  . Coronary artery disease      Strong family history in first degree relatives  . Diabetes Mother     died recently in 01-27-10 due to 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  She was on dialysis and died with cardiac arrest as per pt.  . Heart disease Father   . Hypertension Father    History   Social History  . Marital Status: Married    Spouse Name: N/A    Number of Children: N/A  . Years of Education: N/A   Social History  Main Topics  . Smoking status: Never Smoker   . Smokeless tobacco: Never Used  . Alcohol Use: No  . Drug Use: No  . Sexual Activity: None   Other Topics Concern  . None   Social History Narrative   Exercises Regularly, currently takes care of one child.  Lives at home with daughter.   Review of Systems: Review of Systems  Constitutional: Negative for fever, chills, weight loss and malaise/fatigue.  HENT: Positive for congestion. Negative for sore throat.   Eyes: Negative for blurred vision and discharge.  Respiratory: Negative for cough, sputum production and shortness of breath.   Cardiovascular: Negative for chest pain, palpitations and leg swelling.  Gastrointestinal: Negative for heartburn, nausea, vomiting, abdominal pain, diarrhea and constipation.  Genitourinary: Negative for dysuria.  Skin: Positive for itching (vaginal).  Negative for rash.  Neurological: Negative for dizziness and headaches.  Psychiatric/Behavioral: Negative for depression.    Objective:  Physical Exam: Filed Vitals:   05/29/13 1452  BP: 119/76  Pulse: 74  Temp: 97.5 F (36.4 C)  TempSrc: Oral  Height: 5\' 2"  (1.575 m)  Weight: 135 lb (61.236 kg)  SpO2: 100%  Physical Exam  Nursing note and vitals reviewed. Constitutional: She is well-developed, well-nourished, and in no distress. No distress.  HENT:  Head: Normocephalic and atraumatic.  Nose: Rhinorrhea present. Right sinus exhibits maxillary sinus tenderness and frontal sinus tenderness. Left sinus exhibits maxillary sinus tenderness and frontal sinus tenderness.  Eyes: Conjunctivae are normal.  Cardiovascular: Normal rate, regular rhythm, normal heart sounds and intact distal pulses.   No murmur heard. Pulmonary/Chest: Effort normal and breath sounds normal. No respiratory distress. She has no wheezes. She has no rales.  Abdominal: Soft. Bowel sounds are normal. She exhibits no distension. There is no tenderness.  Musculoskeletal: She exhibits no edema.  Skin: Skin is warm and dry. She is not diaphoretic.  Psychiatric: Affect and judgment normal.     Assessment & Plan:   See Problem Based Assessment and Plan Meds ordered this encounter  Medications  . cyclobenzaprine (FLEXERIL) 10 MG tablet    Sig: Take 1 tablet (10 mg total) by mouth 3 (three) times daily as needed.    Dispense:  90 tablet    Refill:  5  . rosuvastatin (CRESTOR) 5 MG tablet    Sig: Take 1 tablet (5 mg total) by mouth daily.    Dispense:  90 tablet    Refill:  2  . esomeprazole (NEXIUM) 40 MG capsule    Sig: Take 1 capsule (40 mg total) by mouth daily before breakfast.    Dispense:  90 capsule    Refill:  2  . DISCONTD: fluconazole (DIFLUCAN) 150 MG tablet    Sig: Take 1 tablet (150 mg total) by mouth once.    Dispense:  30 tablet    Refill:  0  . fluconazole (DIFLUCAN) 150 MG tablet    Sig:  Take 1 tablet (150 mg total) by mouth once.    Dispense:  1 tablet    Refill:  0

## 2013-05-29 NOTE — Progress Notes (Signed)
I saw this patient with Dr. Mikey Bussing. She reports curdy white vaginal discharge but refuses an exam to both Dr. Mikey Bussing and myself. Given that the patient has had candidal vaginal infections before, and the typical history that she provides, and the low risk and abuse potential of Fluconazole, I am okay with providing her a one time dose of Fluconazole 150 mg tablet this visit. If the patient reports that she is not benefited with the treatment, she would have to undergo a vaginal exam, without which we would be unable to provide her any treatment in the future.     I agree with the rest of the history and exam findings that Dr. Mikey Bussing reports. I agree with the plan that Dr. Mikey Bussing has presented to me for this patient.

## 2013-06-02 ENCOUNTER — Telehealth: Payer: Self-pay | Admitting: *Deleted

## 2013-06-02 DIAGNOSIS — F810 Specific reading disorder: Secondary | ICD-10-CM

## 2013-06-02 NOTE — Telephone Encounter (Signed)
Call from patient  stating that she would like to get an appointment with an eye doctor.  Said she is having difficulty seeing clearly to read.  Message to Dr. Mikey Bussing.  Angelina Ok, RN 06/02/2013 8:45 AM.

## 2013-07-15 ENCOUNTER — Encounter: Payer: Self-pay | Admitting: Internal Medicine

## 2013-07-15 ENCOUNTER — Ambulatory Visit (INDEPENDENT_AMBULATORY_CARE_PROVIDER_SITE_OTHER): Payer: No Typology Code available for payment source | Admitting: Internal Medicine

## 2013-07-15 DIAGNOSIS — Z23 Encounter for immunization: Secondary | ICD-10-CM

## 2013-07-15 DIAGNOSIS — Z111 Encounter for screening for respiratory tuberculosis: Secondary | ICD-10-CM

## 2013-07-15 MED ORDER — ESTRADIOL 0.1 MG/GM VA CREA: 1.0000 | TOPICAL_CREAM | Freq: Every day | VAGINAL | Status: DC

## 2013-07-15 NOTE — Progress Notes (Signed)
Subjective:     Patient ID: Briana Parker, female   DOB: [birth year], 47 y.o.   MRN: [id number]  HPI The patient is a 47 year old female with history of asthma, hyperlipidemia, low back pain, recent yeast infection. She states she was seen about 2 months ago previous infection, took the medication that I prescribed, her yeast infection cleared. She states that since her hysterectomy in 2006 she's noticed that her vagina has a thick coating on the inside which does not proliferate or, outward. It never gets on her undergarments. She states that since then she's had decreased lubrication with sex and occasional dryness and has had to use artificial lubricants. She recently got married in June and this was when she found out about the coating of her vagina. She has not had any itching, discharge, urinary symptoms such as frequency, urgency, pain. She does not have pain during sex. She only has one partner. She states that she uses vinegar inside her vagina once a month to cleanse it. She also uses a feminine hygiene product on her vagina when she showers or bathes.  Review of Systems  Constitutional: Negative for fever, chills, activity change, appetite change, fatigue and unexpected weight change.  Respiratory: Negative for cough, shortness of breath and wheezing.   Cardiovascular: Negative for chest pain, palpitations and leg swelling.  Gastrointestinal: Negative for nausea, vomiting, abdominal pain and diarrhea.  Genitourinary: Negative for dysuria, urgency, frequency, hematuria, flank pain, decreased urine volume, vaginal bleeding, vaginal discharge, enuresis, difficulty urinating, genital sores, vaginal pain, menstrual problem, pelvic pain and dyspareunia.  Musculoskeletal: Positive for back pain.       Chronic, unchanged       Objective:   Physical Exam  Constitutional: She appears well-developed and well-nourished.  HENT:  Head: Normocephalic and atraumatic.  Eyes: EOM are normal. Pupils  are equal, round, and reactive to light.  Neck: Normal range of motion. Neck supple.  Cardiovascular: Normal rate and regular rhythm.   No murmur heard. Pulmonary/Chest: Effort normal and breath sounds normal. No respiratory distress. She has no wheezes. She has no rales. She exhibits no tenderness.  Abdominal: Soft. Bowel sounds are normal. She exhibits no distension. There is no tenderness. There is no rebound.  Genitourinary:  Patient did not wish to have GU/GYN exam at today's visit.   Musculoskeletal: Normal range of motion. She exhibits tenderness.  Chronic lumbar pain.  Neurological: She is alert. No cranial nerve deficit.  Skin: Skin is warm and dry.       Assessment:   1. Vaginal dryness-patient does have increasing vaginal dryness since hysterectomy. Will prescribe her vaginal estrogen cream. Suspect that this coating of her vagina is normal lubrication that may be thickened due to decreased lubrication. Do not suspect that she has a yeast infection at this time. Advised her to stop using vinegar in her vagina. Advised her that she does not need to use a feminine hygiene product on her vagina when she showers and that she should just use warm water and gentle cleansing touch.  2. Disposition-the patient will be seen back with her PCP in 3-6 months. She was given prescription for vaginal estrogen cream today. No other changes to her medications. She was given Tdap at today's visit and TB skin test was placed. She was instructed to return on Thursday or Friday of this week to have it read.

## 2013-07-15 NOTE — Patient Instructions (Signed)
We will give you an estrogen cream to place in your vagina to help with lubrication. Use 1 at night time and if it helps then continue taking it. We do not think you have a yeast infection.   Stop using the vinegar inside your vagina as this can dry out the skin.   We will give you a tetanus shot today and TB skin test. Come back on Thursday or Friday to get the TB test read.   Come back with your regular doctor in 3-6 months and call with any questions or problems before then at [phone].

## 2013-07-16 NOTE — Progress Notes (Signed)
Case discussed with Dr. Kollar soon after the resident saw the patient.  We reviewed the resident's history and exam and pertinent patient test results.  I agree with the assessment, diagnosis, and plan of care documented in the resident's note. 

## 2013-07-23 ENCOUNTER — Telehealth: Payer: Self-pay | Admitting: *Deleted

## 2013-07-23 MED ORDER — ESTROGENS, CONJUGATED 0.625 MG/GM VA CREA: 1.0000 | TOPICAL_CREAM | Freq: Every day | VAGINAL | Status: DC

## 2013-07-23 NOTE — Telephone Encounter (Signed)
Changed to premarin cream, please call in to [facility].

## 2013-07-23 NOTE — Telephone Encounter (Signed)
[facility] only carries premarin cream, do you want to change estrace to premarin? Please change med list also if you do, triage will call it to [facility] pharm.

## 2013-08-04 NOTE — Telephone Encounter (Signed)
Pt called asking about this medication.  Not sure why it was not called into pharmacy. Called in to [facility] today.

## 2013-08-22 ENCOUNTER — Emergency Department (HOSPITAL_COMMUNITY)
Admission: EM | Admit: 2013-08-22 | Discharge: 2013-08-22 | Disposition: A | Discharge status: Home / Self Care | Payer: No Typology Code available for payment source | Attending: Emergency Medicine | Admitting: Emergency Medicine

## 2013-08-22 ENCOUNTER — Emergency Department (HOSPITAL_COMMUNITY): Payer: No Typology Code available for payment source

## 2013-08-22 ENCOUNTER — Encounter (HOSPITAL_COMMUNITY): Payer: Self-pay | Admitting: Emergency Medicine

## 2013-08-22 DIAGNOSIS — Z79899 Other long term (current) drug therapy: Secondary | ICD-10-CM | POA: Insufficient documentation

## 2013-08-22 DIAGNOSIS — S39012A Strain of muscle, fascia and tendon of lower back, initial encounter: Secondary | ICD-10-CM

## 2013-08-22 DIAGNOSIS — Y9389 Activity, other specified: Secondary | ICD-10-CM | POA: Insufficient documentation

## 2013-08-22 DIAGNOSIS — IMO0001 Reserved for inherently not codable concepts without codable children: Secondary | ICD-10-CM | POA: Insufficient documentation

## 2013-08-22 DIAGNOSIS — Y92410 Unspecified street and highway as the place of occurrence of the external cause: Secondary | ICD-10-CM | POA: Insufficient documentation

## 2013-08-22 DIAGNOSIS — S336XXA Sprain of sacroiliac joint, initial encounter: Secondary | ICD-10-CM | POA: Insufficient documentation

## 2013-08-22 DIAGNOSIS — E785 Hyperlipidemia, unspecified: Secondary | ICD-10-CM | POA: Insufficient documentation

## 2013-08-22 MED ORDER — CYCLOBENZAPRINE HCL 10 MG PO TABS: 10.0000 mg | ORAL_TABLET | Freq: Two times a day (BID) | ORAL | Status: DC | PRN

## 2013-08-22 MED ORDER — TRAMADOL HCL 50 MG PO TABS: 50.0000 mg | ORAL_TABLET | Freq: Four times a day (QID) | ORAL | Status: DC | PRN
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
## 2013-08-22 NOTE — ED Provider Notes (Signed)
Medical screening examination/treatment/procedure(s) were performed by non-physician practitioner and as supervising physician I was immediately available for consultation/collaboration.  EKG Interpretation   None         Charles B. Bernette Mayers, MD 08/22/13 [phone]
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
## 2013-08-22 NOTE — ED Notes (Signed)
Patient is refusing to put on a gown.

## 2013-09-08 ENCOUNTER — Ambulatory Visit: Payer: No Typology Code available for payment source

## 2013-09-15 ENCOUNTER — Other Ambulatory Visit: Payer: Self-pay | Admitting: *Deleted

## 2013-09-15 ENCOUNTER — Ambulatory Visit: Payer: No Typology Code available for payment source

## 2013-09-15 MED ORDER — ESOMEPRAZOLE MAGNESIUM 40 MG PO CPDR: 40.0000 mg | DELAYED_RELEASE_CAPSULE | Freq: Every day | ORAL | Status: DC

## 2013-09-16 ENCOUNTER — Other Ambulatory Visit: Payer: Self-pay | Admitting: Internal Medicine

## 2013-09-16 MED ORDER — ESOMEPRAZOLE MAGNESIUM 40 MG PO CPDR: 40.0000 mg | DELAYED_RELEASE_CAPSULE | Freq: Every day | ORAL | Status: DC

## 2013-09-22 ENCOUNTER — Ambulatory Visit: Payer: No Typology Code available for payment source

## 2013-09-22 ENCOUNTER — Telehealth: Payer: Self-pay | Admitting: *Deleted

## 2013-09-22 ENCOUNTER — Ambulatory Visit: Payer: No Typology Code available for payment source | Admitting: Internal Medicine

## 2013-09-22 NOTE — Telephone Encounter (Signed)
Pt aware [facility] Pt called today - past 2 weeks has had green productive cough and body aches - hot and chills. Denies temperature at this time. Appt made 09/22/13 2:30PM Dr Burtis JunesSadek. Stanton KidneyDebra Maximiliano Cromartie RN 09/22/13 11:15AM

## 2013-09-24 ENCOUNTER — Ambulatory Visit (INDEPENDENT_AMBULATORY_CARE_PROVIDER_SITE_OTHER): Payer: No Typology Code available for payment source | Admitting: Internal Medicine

## 2013-09-24 ENCOUNTER — Encounter: Payer: Self-pay | Admitting: Internal Medicine

## 2013-09-24 VITALS — BP 111/70 | HR 69 | Temp 99.0°F | Ht 62.0 in | Wt 138.7 lb

## 2013-09-24 DIAGNOSIS — J019 Acute sinusitis, unspecified: Secondary | ICD-10-CM | POA: Insufficient documentation

## 2013-09-24 DIAGNOSIS — R6889 Other general symptoms and signs: Secondary | ICD-10-CM | POA: Insufficient documentation

## 2013-09-24 MED ORDER — IPRATROPIUM-ALBUTEROL 0.5-2.5 (3) MG/3ML IN SOLN: 3.0000 mL | RESPIRATORY_TRACT | Status: DC

## 2013-09-24 MED ORDER — FLUTICASONE PROPIONATE 50 MCG/ACT NA SUSP: 1.0000 | Freq: Every day | NASAL | Status: DC

## 2013-09-24 MED ORDER — DEXTROMETHORPHAN-GUAIFENESIN 10-100 MG/5ML PO LIQD
5.0000 mL | ORAL | Status: DC | PRN
Start: 2013-09-24 — End: 2013-11-10

## 2013-09-24 MED ORDER — CEFTRIAXONE SODIUM 1 G IJ SOLR
1.0000 g | Freq: Once | INTRAMUSCULAR | Status: AC
  Administered 2013-09-24: 1 g via INTRAMUSCULAR

## 2013-09-24 MED ORDER — ALBUTEROL SULFATE (2.5 MG/3ML) 0.083% IN NEBU
2.5000 mg | INHALATION_SOLUTION | Freq: Once | RESPIRATORY_TRACT | Status: AC
  Administered 2013-09-24: 2.5 mg via RESPIRATORY_TRACT

## 2013-09-24 MED ORDER — ALBUTEROL SULFATE HFA 108 (90 BASE) MCG/ACT IN AERS: 2.0000 | INHALATION_SPRAY | Freq: Four times a day (QID) | RESPIRATORY_TRACT | Status: DC | PRN

## 2013-09-24 MED ORDER — IPRATROPIUM BROMIDE 0.02 % IN SOLN
0.5000 mg | Freq: Once | RESPIRATORY_TRACT | Status: AC
  Administered 2013-09-24: 0.5 mg via RESPIRATORY_TRACT

## 2013-09-24 NOTE — Patient Instructions (Signed)
Influenza, Adult °Influenza (flu) is an infection in the mouth, nose, and throat (respiratory tract) caused by a virus. The flu can make you feel very ill. Influenza spreads easily from person to person (contagious).  °HOME CARE  °· Only take medicines as told by your doctor. °· Use a cool mist humidifier to make breathing easier. °· Get plenty of rest until your fever goes away. This usually takes 3 to 4 days. °· Drink enough fluids to keep your pee (urine) clear or pale yellow. °· Cover your mouth and nose when you cough or sneeze. °· Wash your hands well to avoid spreading the flu. °· Stay home from work or school until your fever has been gone for at least 1 full day. °· Get a flu shot every year. °GET HELP RIGHT AWAY IF:  °· You have trouble breathing or feel short of breath. °· Your skin or nails turn blue. °· You have severe neck pain or stiffness. °· You have a severe headache, facial pain, or earache. °· Your fever gets worse or keeps coming back. °· You feel sick to your stomach (nauseous), throw up (vomit), or have watery poop (diarrhea). °· You have chest pain. °· You have a deep cough that gets worse, or you cough up more thick spit (mucus). °MAKE SURE YOU:  °· Understand these instructions. °· Will watch your condition. °· Will get help right away if you are not doing well or get worse. °Document Released: 06/06/2008 Document Revised: 02/27/2012 Document Reviewed: 11/27/2011 °ExitCare® Patient Information ©2014 [employer]. ° °

## 2013-09-24 NOTE — Progress Notes (Signed)
   Subjective:    Patient ID: Briana Parker, female    DOB: 02/03/1966, 48 y.o.   MRN: [id number]  HPI [name] 48 yo woman pmh as listed below presents for ongoing congestion and myalgias.   Pt describes symptoms for the past 2 weeks with some sick contacts at church. Symptoms began first with sore throat and then progressed rapidly with some diffuse arthralgias and myalgias. She continued to have wheezing and then required the use of her inhalers up to 4x/day with some relief. She tried OTC theraflu, tylenol, mucinex, tea, and ibuprofen with minimal relief. She continues to have ongoing on/off fevers/chills and night sweats, vomiting x2 days and diarrhea x2 days. Her coughing is becoming productive in nature with thick green mucus and she has worsening sinus pressure worse when bending over and underlying sinus disease per the patient. Her cough and SOB are now keeping her up at night and she has had poor sleep for last 24 hrs.   Review of Systems  Constitutional: Positive for fever, chills, activity change (decreased 2/2 fatigue), appetite change (decreased ) and fatigue.  HENT: Positive for congestion, ear pain, postnasal drip, rhinorrhea, sinus pressure, sneezing and sore throat.   Respiratory: Positive for cough, chest tightness, shortness of breath and wheezing.   Cardiovascular: Negative for chest pain, palpitations and leg swelling.  Gastrointestinal: Positive for vomiting (with cough) and diarrhea. Negative for nausea, abdominal pain and constipation.  Musculoskeletal: Positive for arthralgias and myalgias.  Skin: Negative for rash.  Neurological: Negative for dizziness and headaches.    Past Medical History  Diagnosis Date  . Fibromyalgia   . Hyperlipidemia   . Low back pain   . Allergic rhinitis    Social, surgical, family history reviewed with patient and updated in appropriate chart locations.      Objective:   Physical Exam Filed Vitals:   09/24/13 1326  BP:  111/70  Pulse: 69  Temp: 99 F (37.2 C)   General: sitting in chair, uncomfortable, NAD HEENT: PERRL, EOMI, no scleral icterus, frontal and maxillary sinus ttp, TM bulging clear fluid no pus or erythema Cardiac: RRR, no rubs, murmurs or gallops Pulm: expiratory wheezes, moving some decreased volumes of air, no crackles or rhonchi Abd: soft, nontender, nondistended, BS present Ext: warm and well perfused, no pedal edema Neuro: alert and oriented X3, cranial nerves II-XII grossly intact     Assessment & Plan:  Please see problem oriented charting  Pt discussed with Dr. Josem KaufmannKlima

## 2013-09-24 NOTE — Assessment & Plan Note (Signed)
Pt with sick contacts and myalgias and arthralgias. Some wheezing given underlying asthma this completely resolved with duoneb in clinic.  -tussin DM -refill of inhalers -symptomatic management -pt received duoneb in clinic with significant improvement

## 2013-09-24 NOTE — Assessment & Plan Note (Signed)
Pt is having symptoms for 3wks and ttp on PE today. Still low grade fevers. Pt refused to take oral Abx and insisted on IM.  -rocephin 1gm IM given in clinic -flonase -symptomatic management with saline nasal spray

## 2013-09-25 ENCOUNTER — Ambulatory Visit: Payer: No Typology Code available for payment source

## 2013-09-25 ENCOUNTER — Telehealth: Payer: Self-pay | Admitting: *Deleted

## 2013-09-25 NOTE — Telephone Encounter (Signed)
Pharmacy informed.  They already have the cream so don't need to reorder.

## 2013-09-25 NOTE — Telephone Encounter (Signed)
Pharmacy called and wanted to know how long you wanted pt to take vaginal premarin.  Daily for how long?  Does it need to be tapered? The supply has arrived in pharmacy but they were not sure about giving her 3 full tubes  Please advise Crystal @ [facility] [phone]

## 2013-09-25 NOTE — Telephone Encounter (Signed)
Can you please call pharmacy, I would prefer for Briana Parker to use the cream nightly for 21 nights, then 7 nights off.  She can continue to repeat this cycle for the at least next 3-6 months until we can assess clinically how this is working for her.  I would be fine with prescribing her 1 tube at a time if that is fine, what size (how many grams) are in each of their tubes.

## 2013-10-05 NOTE — Progress Notes (Signed)
Case discussed with Dr. Sadek soon after the resident saw the patient.  We reviewed the resident's history and exam and pertinent patient test results.  I agree with the assessment, diagnosis, and plan of care documented in the resident's note. 

## 2013-10-30 ENCOUNTER — Telehealth: Payer: Self-pay | Admitting: *Deleted

## 2013-10-30 NOTE — Telephone Encounter (Signed)
Also requesting refill on Premarin Vaginal Cream  1 box Use 1 applicationful every night for 21 nights,then stop for 7 night. May continue to repeat cycle for next 3-6 months.Then f/u w/MD/

## 2013-10-30 NOTE — Telephone Encounter (Addendum)
Fax from [facility] - pt states Crestor had been discontinued.  10/16/12 Lipid panel was done and Dr Dorthula RueSawhney wrote safe to d/cCrestor. Please advise.  Thanks

## 2013-10-31 MED ORDER — ESTROGENS, CONJUGATED 0.625 MG/GM VA CREA: TOPICAL_CREAM | VAGINAL | Status: DC

## 2013-10-31 NOTE — Telephone Encounter (Signed)
Premarin cream rx called to [facility]. Attempted to call pt about Crestor , no answer.

## 2013-10-31 NOTE — Telephone Encounter (Signed)
Please call in Premarin vaginal cream.  I would probably recommend continuing crestor if she is tolerating it (her LDL was mildly elevated last year and it appears she was already taking crestor at that time), if she wishes to stop that would be reasonable and we can recheck a lipid panel at a visit once she has been off for a while.

## 2013-11-03 NOTE — Telephone Encounter (Signed)
Pt states she has not been taking Crestor. An appt has been schedule w/front office on 11/10/13 with Dr Mikey BussingHoffman.

## 2013-11-10 ENCOUNTER — Encounter: Payer: Self-pay | Admitting: Internal Medicine

## 2013-11-10 ENCOUNTER — Ambulatory Visit (INDEPENDENT_AMBULATORY_CARE_PROVIDER_SITE_OTHER): Payer: No Typology Code available for payment source | Admitting: Internal Medicine

## 2013-11-10 VITALS — BP 106/74 | HR 96 | Temp 97.9°F | Ht 62.0 in | Wt 137.1 lb

## 2013-11-10 DIAGNOSIS — Z131 Encounter for screening for diabetes mellitus: Secondary | ICD-10-CM | POA: Insufficient documentation

## 2013-11-10 DIAGNOSIS — Z833 Family history of diabetes mellitus: Secondary | ICD-10-CM | POA: Insufficient documentation

## 2013-11-10 DIAGNOSIS — E785 Hyperlipidemia, unspecified: Secondary | ICD-10-CM

## 2013-11-10 DIAGNOSIS — M545 Low back pain, unspecified: Secondary | ICD-10-CM

## 2013-11-10 DIAGNOSIS — Z0000 Encounter for general adult medical examination without abnormal findings: Secondary | ICD-10-CM

## 2013-11-10 DIAGNOSIS — G8929 Other chronic pain: Secondary | ICD-10-CM

## 2013-11-10 LAB — LIPID PANEL
Cholesterol: 251 mg/dL — ABNORMAL HIGH (ref 0–200)
HDL: 52 mg/dL (ref >39)
LDL CALC: 182 mg/dL — AB (ref 0–99)
TRIGLYCERIDES: 85 mg/dL (ref <150)
Total CHOL/HDL Ratio: 4.8 Ratio
VLDL: 17 mg/dL (ref 0–40)

## 2013-11-10 LAB — COMPLETE METABOLIC PANEL WITH GFR
ALK PHOS: 65 U/L (ref 39–117)
ALT: 13 U/L (ref 0–35)
AST: 16 U/L (ref 0–37)
Albumin: 4.5 g/dL (ref 3.5–5.2)
BUN: 12 mg/dL (ref 6–23)
CO2: 28 mEq/L (ref 19–32)
CREATININE: 0.92 mg/dL (ref 0.50–1.10)
Calcium: 9.6 mg/dL (ref 8.4–10.5)
Chloride: 103 mEq/L (ref 96–112)
GFR, Est African American: 85 mL/min
GFR, Est Non African American: 73 mL/min
Glucose, Bld: 93 mg/dL (ref 70–99)
Potassium: 4.4 mEq/L (ref 3.5–5.3)
SODIUM: 139 meq/L (ref 135–145)
Total Bilirubin: 0.5 mg/dL (ref 0.2–1.2)
Total Protein: 7.1 g/dL (ref 6.0–8.3)

## 2013-11-10 LAB — HIV ANTIBODY (ROUTINE TESTING W REFLEX): HIV: NONREACTIVE

## 2013-11-10 MED ORDER — DOXEPIN HCL 25 MG PO CAPS: 25.0000 mg | ORAL_CAPSULE | Freq: Every day | ORAL | Status: DC

## 2013-11-10 MED ORDER — ESOMEPRAZOLE MAGNESIUM 40 MG PO CPDR: 40.0000 mg | DELAYED_RELEASE_CAPSULE | Freq: Every day | ORAL | Status: DC

## 2013-11-10 MED ORDER — CYCLOBENZAPRINE HCL 10 MG PO TABS: 10.0000 mg | ORAL_TABLET | Freq: Three times a day (TID) | ORAL | Status: DC | PRN

## 2013-11-10 NOTE — Assessment & Plan Note (Signed)
Last A1c 5.9 over one year ago.  Will screen for diabetes today with CMP as patient is fasting.

## 2013-11-10 NOTE — Assessment & Plan Note (Signed)
Patient no longer taking gabapentin, reports her pain is moderately controlled with flexeril. Encouraged daily exercise.

## 2013-11-10 NOTE — Assessment & Plan Note (Signed)
Will check HIV antibody today. Patient no longer candidate for pap smears, s/p hysterectomy no history of abnormal paps.. Will need referral for colonoscopy at next visit as she will be turning 48.

## 2013-11-10 NOTE — Assessment & Plan Note (Signed)
Patient previously treated with low dose crestor. This was discontinued months ago.  Will recheck lipid panel today.

## 2013-11-10 NOTE — Progress Notes (Signed)
[facility] Subjective:   Patient ID: Briana Parker female   DOB: [birth year] 48 y.o.   MRN: [id number]  HPI: [name] is a 48 y.o. female with PMH of HTN, HLD, allergic rhinitis. She presents today for regular followup. She reports she needs a refill on her medications and reports no issues with her meds.  We have reconciled her medication list and removed many of the medications she is no longer taking. She reports she feels overall well today, she reports she continues to have some low back back pain, she takes flexeril for her pain, she is not taking NSAIDs for her pain as she reports this worsens her heartburn. Her main concern today is preventative care.  She reports a relative recently passed away due to complication of renal failure and coronary artery disease and wants to make sure her lab work is up to date.  She notes she has a strong family history of diabetes and that her mother ended up blind and on dialysis due to diabetes.  Past Medical History  Diagnosis Date  . Fibromyalgia   . Hyperlipidemia   . Low back pain   . Allergic rhinitis    Current Outpatient Prescriptions  Medication Sig Dispense Refill  . albuterol (PROVENTIL HFA;VENTOLIN HFA) 108 (90 BASE) MCG/ACT inhaler Inhale 2 puffs into the lungs 4 (four) times daily as needed.  1 Inhaler  3  . carboxymethylcellulose (LUBRICANT EYE DROPS) 0.5 % SOLN Place 2 drops into both eyes 3 (three) times daily as needed.  15 mL  0  . conjugated estrogens (PREMARIN) vaginal cream Use 1 applicationful every night for 21 nights,then stop for 7 night.  42.5 g  3  . cyclobenzaprine (FLEXERIL) 10 MG tablet Take 1 tablet (10 mg total) by mouth 3 (three) times daily as needed.  90 tablet  5  . doxepin (SINEQUAN) 25 MG capsule Take 1 capsule (25 mg total) by mouth at bedtime.  30 capsule  5  . esomeprazole (NEXIUM) 40 MG capsule Take 1 capsule (40 mg total) by mouth daily before breakfast.  90 capsule  2  .  fluticasone (FLONASE) 50 MCG/ACT nasal spray Place 1 spray into both nostrils daily.  16 g  2  . senna-docusate (SENOKOT-S) 8.6-50 MG per tablet Take 1 tablet by mouth daily. Take one tablet every 12 hours as needed for constipation        No current facility-administered medications for this visit.   Family History  Problem Relation Age of Onset  . Coronary artery disease      Strong family history in first degree relatives  . Diabetes Mother     died recently in 26-Jan-2010 due to 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  She was on dialysis and died with cardiac arrest as per pt.  . Heart disease Father   . Hypertension Father    History   Social History  . Marital Status: Married    Spouse Name: N/A    Number of Children: N/A  . Years of Education: N/A   Social History Main Topics  . Smoking status: Never Smoker   . Smokeless tobacco: Never Used  . Alcohol Use: No  . Drug Use: No  . Sexual Activity: None   Other Topics Concern  . None   Social History Narrative   Exercises Regularly, currently takes care of one child.  Lives at home with daughter.   Review of Systems: Review of Systems  Constitutional: Negative for fever, chills,  weight loss and malaise/fatigue.  HENT: Positive for congestion. Negative for sore throat.   Eyes: Negative for blurred vision and discharge.  Respiratory: Negative for cough, sputum production and shortness of breath.   Cardiovascular: Negative for chest pain, palpitations and leg swelling.  Gastrointestinal: Negative for heartburn, nausea, vomiting, abdominal pain, diarrhea and constipation.  Genitourinary: Negative for dysuria.  Musculoskeletal: Positive for back pain.  Skin: Positive for itching (occassionally). Negative for rash.  Neurological: Negative for dizziness and headaches.  Psychiatric/Behavioral: Negative for depression.    Objective:  Physical Exam: Filed Vitals:   11/10/13 0843  BP: 106/74  Pulse: 96  Temp: 97.9 F (36.6 C)  TempSrc: Oral   Height: 5\' 2"  (1.575 m)  Weight: 137 lb 1.6 oz (62.188 kg)  SpO2: 100%  Physical Exam  Nursing note and vitals reviewed. Constitutional: She is well-developed, well-nourished, and in no distress. No distress.  HENT:  Head: Normocephalic and atraumatic.  Eyes: Conjunctivae are normal.  Cardiovascular: Normal rate, regular rhythm, normal heart sounds and intact distal pulses.   No murmur heard. Pulmonary/Chest: Effort normal and breath sounds normal. No respiratory distress. She has no wheezes. She has no rales.  Abdominal: Soft. Bowel sounds are normal. She exhibits no distension. There is no tenderness.  Musculoskeletal: She exhibits no edema.  Skin: Skin is warm and dry. She is not diaphoretic.  Psychiatric: Affect and judgment normal.     Assessment & Plan:   See Problem Based Assessment and Plan Meds ordered this encounter  Medications  . cyclobenzaprine (FLEXERIL) 10 MG tablet    Sig: Take 1 tablet (10 mg total) by mouth 3 (three) times daily as needed.    Dispense:  90 tablet    Refill:  5  . doxepin (SINEQUAN) 25 MG capsule    Sig: Take 1 capsule (25 mg total) by mouth at bedtime.    Dispense:  30 capsule    Refill:  5  . esomeprazole (NEXIUM) 40 MG capsule    Sig: Take 1 capsule (40 mg total) by mouth daily before breakfast.    Dispense:  90 capsule    Refill:  2

## 2013-11-10 NOTE — Patient Instructions (Signed)
I will call you with the lab results

## 2013-11-11 ENCOUNTER — Other Ambulatory Visit: Payer: Self-pay | Admitting: Internal Medicine

## 2013-11-11 MED ORDER — ROSUVASTATIN CALCIUM 5 MG PO TABS: 5.0000 mg | ORAL_TABLET | Freq: Every day | ORAL | Status: DC

## 2013-11-12 NOTE — Progress Notes (Signed)
Case discussed with Dr. Hoffman at the time of the visit.  We reviewed the resident's history and exam and pertinent patient test results.  I agree with the assessment, diagnosis, and plan of care documented in the resident's note. 

## 2013-11-19 ENCOUNTER — Telehealth: Payer: Self-pay | Admitting: *Deleted

## 2013-11-19 NOTE — Telephone Encounter (Signed)
Health dept can no longer get flonase for free, would you change flonase to nasonex? Please change the med list to reflect this if you choose to change, thanks

## 2013-11-20 MED ORDER — MOMETASONE FUROATE 50 MCG/ACT NA SUSP: 2.0000 | Freq: Every day | NASAL | Status: DC

## 2013-11-20 NOTE — Telephone Encounter (Signed)
Done

## 2013-11-20 NOTE — Telephone Encounter (Signed)
Called to pharm 

## 2013-12-10 ENCOUNTER — Telehealth: Payer: Self-pay | Admitting: *Deleted

## 2013-12-10 ENCOUNTER — Encounter: Payer: Self-pay | Admitting: Internal Medicine

## 2013-12-10 ENCOUNTER — Ambulatory Visit (INDEPENDENT_AMBULATORY_CARE_PROVIDER_SITE_OTHER): Payer: No Typology Code available for payment source | Admitting: Internal Medicine

## 2013-12-10 VITALS — BP 121/82 | HR 98 | Temp 97.2°F | Wt 139.6 lb

## 2013-12-10 DIAGNOSIS — J019 Acute sinusitis, unspecified: Secondary | ICD-10-CM

## 2013-12-10 MED ORDER — AMOXICILLIN-POT CLAVULANATE 875-125 MG PO TABS: 1.0000 | ORAL_TABLET | Freq: Two times a day (BID) | ORAL | Status: AC

## 2013-12-10 NOTE — Patient Instructions (Signed)
I have diagnosed you with a sinus infection. Please take the antibiotics as prescribed. Please contact MD for new or worsening symptoms.   Sinusitis Sinusitis is redness, soreness, and swelling (inflammation) of the paranasal sinuses. Paranasal sinuses are air pockets within the bones of your face (beneath the eyes, the middle of the forehead, or above the eyes). In healthy paranasal sinuses, mucus is able to drain out, and air is able to circulate through them by way of your nose. However, when your paranasal sinuses are inflamed, mucus and air can become trapped. This can allow bacteria and other germs to grow and cause infection. Sinusitis can develop quickly and last only a short time (acute) or continue over a long period (chronic). Sinusitis that lasts for more than 12 weeks is considered chronic.  CAUSES  Causes of sinusitis include:  Allergies.  Structural abnormalities, such as displacement of the cartilage that separates your nostrils (deviated septum), which can decrease the air flow through your nose and sinuses and affect sinus drainage.  Functional abnormalities, such as when the small hairs (cilia) that line your sinuses and help remove mucus do not work properly or are not present. SYMPTOMS  Symptoms of acute and chronic sinusitis are the same. The primary symptoms are pain and pressure around the affected sinuses. Other symptoms include:  Upper toothache.  Earache.  Headache.  Bad breath.  Decreased sense of smell and taste.  A cough, which worsens when you are lying flat.  Fatigue.  Fever.  Thick drainage from your nose, which often is green and may contain pus (purulent).  Swelling and warmth over the affected sinuses. DIAGNOSIS  Your caregiver will perform a physical exam. During the exam, your caregiver may:  Look in your nose for signs of abnormal growths in your nostrils (nasal polyps).  Tap over the affected sinus to check for signs of  infection.  View the inside of your sinuses (endoscopy) with a special imaging device with a light attached (endoscope), which is inserted into your sinuses. If your caregiver suspects that you have chronic sinusitis, one or more of the following tests may be recommended:  Allergy tests.  Nasal culture A sample of mucus is taken from your nose and sent to a lab and screened for bacteria.  Nasal cytology A sample of mucus is taken from your nose and examined by your caregiver to determine if your sinusitis is related to an allergy. TREATMENT  Most cases of acute sinusitis are related to a viral infection and will resolve on their own within 10 days. Sometimes medicines are prescribed to help relieve symptoms (pain medicine, decongestants, nasal steroid sprays, or saline sprays).  However, for sinusitis related to a bacterial infection, your caregiver will prescribe antibiotic medicines. These are medicines that will help kill the bacteria causing the infection.  Rarely, sinusitis is caused by a fungal infection. In theses cases, your caregiver will prescribe antifungal medicine. For some cases of chronic sinusitis, surgery is needed. Generally, these are cases in which sinusitis recurs more than 3 times per year, despite other treatments. HOME CARE INSTRUCTIONS   Drink plenty of water. Water helps thin the mucus so your sinuses can drain more easily.  Use a humidifier.  Inhale steam 3 to 4 times a day (for example, sit in the bathroom with the shower running).  Apply a warm, moist washcloth to your face 3 to 4 times a day, or as directed by your caregiver.  Use saline nasal sprays to help moisten  and clean your sinuses.  Take over-the-counter or prescription medicines for pain, discomfort, or fever only as directed by your caregiver. SEEK IMMEDIATE MEDICAL CARE IF:  You have increasing pain or severe headaches.  You have nausea, vomiting, or drowsiness.  You have swelling around your  face.  You have vision problems.  You have a stiff neck.  You have difficulty breathing. MAKE SURE YOU:   Understand these instructions.  Will watch your condition.  Will get help right away if you are not doing well or get worse. Document Released: 08/28/2005 Document Revised: 11/20/2011 Document Reviewed: 09/12/2011 [facility] Patient Information 2014 [place], [place].

## 2013-12-10 NOTE — Telephone Encounter (Signed)
Agree, thanks

## 2013-12-10 NOTE — Progress Notes (Signed)
Patient ID: Briana Parker, female   DOB: 07/17/1966, 48 y.o.   MRN: [id number]    Subjective:   Patient ID: Briana Parker female   DOB: 05/02/1966 48 y.o.   MRN: [id number]  HPI: Briana Parker is a 48 y.o. woman who presents with a cc of sinus pain and pressure behind her eyes. The patient was in her normal state of health until 2 weeks ago when her symptoms began gradually. She has noticed increased facial sinus pressure and drainage of yellow mucous. Associated symptoms of sore throat and body aches. Denies fever, and cough. Denies chest pain, abdominal pain, SOB, orthopnea, changes in bowel or bladder habits. Patient has been on nasonex for 2 months with minimal relief. She has a history of sinus infections and she states that her symptoms are similar to her previous infections.     Past Medical History  Diagnosis Date  . Fibromyalgia   . Hyperlipidemia   . Low back pain   . Allergic rhinitis    Current Outpatient Prescriptions  Medication Sig Dispense Refill  . albuterol (PROVENTIL HFA;VENTOLIN HFA) 108 (90 BASE) MCG/ACT inhaler Inhale 2 puffs into the lungs 4 (four) times daily as needed.  1 Inhaler  3  . carboxymethylcellulose (LUBRICANT EYE DROPS) 0.5 % SOLN Place 2 drops into both eyes 3 (three) times daily as needed.  15 mL  0  . conjugated estrogens (PREMARIN) vaginal cream Use 1 applicationful every night for 21 nights,then stop for 7 night.  42.5 g  3  . cyclobenzaprine (FLEXERIL) 10 MG tablet Take 1 tablet (10 mg total) by mouth 3 (three) times daily as needed.  90 tablet  5  . doxepin (SINEQUAN) 25 MG capsule Take 1 capsule (25 mg total) by mouth at bedtime.  30 capsule  5  . esomeprazole (NEXIUM) 40 MG capsule Take 1 capsule (40 mg total) by mouth daily before breakfast.  90 capsule  2  . mometasone (NASONEX) 50 MCG/ACT nasal spray Place 2 sprays into the nose daily.  17 g  2  . rosuvastatin (CRESTOR) 5 MG tablet Take 1 tablet (5 mg total) by mouth daily.  90  tablet  3  . senna-docusate (SENOKOT-S) 8.6-50 MG per tablet Take 1 tablet by mouth daily. Take one tablet every 12 hours as needed for constipation        No current facility-administered medications for this visit.   Family History  Problem Relation Age of Onset  . Coronary artery disease      Strong family history in first degree relatives  . Diabetes Mother     died recently in 01/2010 due to 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  She was on dialysis and died with cardiac arrest as per pt.  . Heart disease Father   . Hypertension Father    History   Social History  . Marital Status: Married    Spouse Name: N/A    Number of Children: N/A  . Years of Education: N/A   Social History Main Topics  . Smoking status: Never Smoker   . Smokeless tobacco: Never Used  . Alcohol Use: No  . Drug Use: No  . Sexual Activity: Not on file   Other Topics Concern  . Not on file   Social History Narrative   Exercises Regularly, currently takes care of one child.  Lives at home with daughter.   Review of Systems: Pertinent items are noted in HPI. Objective:  Physical Exam: Filed Vitals:  12/10/13 0940  BP: 121/82  Pulse: 98  Temp: 97.2 F (36.2 C)  TempSrc: Oral  Weight: 139 lb 9.6 oz (63.322 kg)  SpO2: 100%   Physical Exam  Constitutional: She is oriented to person, place, and time. She appears well-developed and well-nourished. No distress.  HENT:  Head: Normocephalic.  Mild to moderate erythema of nasal mucosa. +drainage. Mild posterior pharyngeal erythema.  Eyes: Conjunctivae and EOM are normal. Pupils are equal, round, and reactive to light.  Cardiovascular: Normal rate, regular rhythm, normal heart sounds and intact distal pulses.  Exam reveals no friction rub.   No murmur heard. Pulmonary/Chest: Effort normal and breath sounds normal. No respiratory distress. She has no wheezes. She has no rales.  Abdominal: Soft. Bowel sounds are normal. She exhibits no distension. There is no rebound  and no guarding.  Neurological: She is alert and oriented to person, place, and time.  Skin: She is not diaphoretic.  Psychiatric: She has a normal mood and affect. Her behavior is normal.    Assessment & Plan:

## 2013-12-10 NOTE — Progress Notes (Signed)
INTERNAL MEDICINE TEACHING ATTENDING ADDENDUM - Briana Wollen, DO: I reviewed and discussed at the time of visit with the resident Dr.Komanski, the patient's medical history, physical examination, diagnosis and results of tests and treatment and I agree with the patient's care as documented 

## 2013-12-10 NOTE — Assessment & Plan Note (Signed)
A Patient appears to have acute bacterial sinusitis. Previously she has demanded IM ABX. Today she is aggreable with PO ABX.   P: Augmentin BID for 10 days. Encoruaged patient to contact physician for new or worsening symptoms.

## 2013-12-10 NOTE — Telephone Encounter (Signed)
Pt calls and states she has a possible sinus infection, h/a, coughing up thick green mucous, desires to be seen, appt [phone] dr Glendell Dockerkomanski, ongoing for 2 weeks

## 2013-12-15 ENCOUNTER — Telehealth: Payer: Self-pay | Admitting: *Deleted

## 2013-12-15 NOTE — Telephone Encounter (Signed)
Pt calls and states she had problems with abx

## 2013-12-19 NOTE — Telephone Encounter (Signed)
closed

## 2013-12-29 ENCOUNTER — Encounter: Payer: Self-pay | Admitting: Internal Medicine

## 2013-12-29 ENCOUNTER — Ambulatory Visit (INDEPENDENT_AMBULATORY_CARE_PROVIDER_SITE_OTHER): Payer: No Typology Code available for payment source | Admitting: Internal Medicine

## 2013-12-29 VITALS — BP 122/82 | HR 99 | Temp 98.6°F | Wt 140.0 lb

## 2013-12-29 DIAGNOSIS — F329 Major depressive disorder, single episode, unspecified: Secondary | ICD-10-CM

## 2013-12-29 DIAGNOSIS — E785 Hyperlipidemia, unspecified: Secondary | ICD-10-CM

## 2013-12-29 DIAGNOSIS — J329 Chronic sinusitis, unspecified: Secondary | ICD-10-CM

## 2013-12-29 DIAGNOSIS — J45909 Unspecified asthma, uncomplicated: Secondary | ICD-10-CM

## 2013-12-29 DIAGNOSIS — F32A Depression, unspecified: Secondary | ICD-10-CM

## 2013-12-29 DIAGNOSIS — K219 Gastro-esophageal reflux disease without esophagitis: Secondary | ICD-10-CM

## 2013-12-29 DIAGNOSIS — J309 Allergic rhinitis, unspecified: Secondary | ICD-10-CM

## 2013-12-29 DIAGNOSIS — J45990 Exercise induced bronchospasm: Secondary | ICD-10-CM

## 2013-12-29 DIAGNOSIS — G8929 Other chronic pain: Secondary | ICD-10-CM

## 2013-12-29 DIAGNOSIS — M545 Low back pain: Secondary | ICD-10-CM

## 2013-12-29 MED ORDER — CETIRIZINE HCL 10 MG PO TABS: 10.0000 mg | ORAL_TABLET | Freq: Every day | ORAL | Status: DC

## 2013-12-29 MED ORDER — DOXEPIN HCL 25 MG PO CAPS: 25.0000 mg | ORAL_CAPSULE | Freq: Every day | ORAL | Status: DC

## 2013-12-29 MED ORDER — ESOMEPRAZOLE MAGNESIUM 40 MG PO CPDR: 40.0000 mg | DELAYED_RELEASE_CAPSULE | Freq: Every day | ORAL | Status: DC

## 2013-12-29 MED ORDER — ALBUTEROL SULFATE HFA 108 (90 BASE) MCG/ACT IN AERS: 2.0000 | INHALATION_SPRAY | Freq: Four times a day (QID) | RESPIRATORY_TRACT | Status: DC | PRN

## 2013-12-29 MED ORDER — CYCLOBENZAPRINE HCL 10 MG PO TABS: 10.0000 mg | ORAL_TABLET | Freq: Three times a day (TID) | ORAL | Status: DC | PRN

## 2013-12-29 MED ORDER — MOMETASONE FUROATE 50 MCG/ACT NA SUSP: 2.0000 | Freq: Every day | NASAL | Status: DC

## 2013-12-29 MED ORDER — ROSUVASTATIN CALCIUM 10 MG PO TABS: 10.0000 mg | ORAL_TABLET | Freq: Every day | ORAL | Status: DC

## 2013-12-29 MED ORDER — ROSUVASTATIN CALCIUM 5 MG PO TABS: 5.0000 mg | ORAL_TABLET | Freq: Every day | ORAL | Status: DC

## 2013-12-29 NOTE — Patient Instructions (Signed)
-Continue drinking plenty of fluids and using Ricola and Robitussin for reactive cough.  -Continue using saline nasal spray 4 times per day.  -Start taking Zyrtec 10mg  every day for your allergies.  -You have been referred to an Ear, Nose, an Throat specialist.  -Continue using the albuterol inhaler for shortness of breath or coughing spells.  -Call us if you have fever or chills or if your symptoms worsen.  -Follow up with Dr. Mikey BussingHoffman in 2 months.   Please bring your medicines with you each time you come.   Medicines may be  Eye drops  Herbal   Vitamins  Pills  Seeing these help us take care of you.   Reactive Airway Disease and Bronchospasm Acute bronchospasm caused by asthma is also referred to as an asthma attack. Bronchospasm means your air passages become narrowed. The narrowing is caused by inflammation and tightening of the muscles in the air tubes (bronchi) in your lungs. This can make it hard to breath or cause you to wheeze and cough. CAUSES Possible triggers are:  Animal dander from the skin, hair, or feathers of animals.  Dust mites contained in house dust.  Cockroaches.  Pollen from trees or grass.  Mold.  Cigarette or tobacco smoke.  Air pollutants such as dust, household cleaners, hair sprays, aerosol sprays, paint fumes, strong chemicals, or strong odors.  Cold air or weather changes. Cold air may trigger inflammation. Winds increase molds and pollens in the air.  Strong emotions such as crying or laughing hard.  Stress.  Certain medicines such as aspirin or beta-blockers.  Sulfites in foods and drinks, such as dried fruits and wine.  Infections or inflammatory conditions, such as a flu, cold, or inflammation of the nasal membranes (rhinitis).  Gastroesophageal reflux disease (GERD). GERD is a condition where stomach acid backs up into your throat (esophagus).  Exercise or strenuous activity. SIGNS AND SYMPTOMS   Wheezing.  Excessive  coughing, particularly at night.  Chest tightness.  Shortness of breath. DIAGNOSIS  Your health care provider will ask you about your medical history and perform a physical exam. A chest X-ray or blood testing may be performed to look for other causes of your symptoms or other conditions that may have triggered your asthma attack. TREATMENT  Treatment is aimed at reducing inflammation and opening up the airways in your lungs. Most asthma attacks are treated with inhaled medicines. These include quick relief or rescue medicines (such as bronchodilators) and controller medicines (such as inhaled corticosteroids). These medicines are sometimes given through an inhaler or a nebulizer. Systemic steroid medicine taken by mouth or given through an IV tube also can be used to reduce the inflammation when an attack is moderate or severe. Antibiotic medicines are only used if a bacterial infection is present.  HOME CARE INSTRUCTIONS   Rest.  Drink plenty of liquids. This helps the mucus to remain thin and be easily coughed up. Only use caffeine in moderation and do not use alcohol until you have recovered from your illness.  Do not smoke. Avoid being exposed to secondhand smoke.  You play a critical role in keeping yourself in good health. Avoid exposure to things that cause you to wheeze or to have breathing problems.  Keep your medicines up to date and available. Carefully follow your health care provider's treatment plan.  Take your medicine exactly as prescribed.  When pollen or pollution is bad, keep windows closed and use an air conditioner or go to places with air  conditioning.  Asthma requires careful medical care. See your health care provider for a follow-up as advised. If you are more than [redacted] weeks pregnant and you were prescribed any new medicines, let your obstetrician know about the visit and how you are doing. Follow-up with your health care provider as directed.  After you have  recovered from your asthma attack, make an appointment with your outpatient doctor to talk about ways to reduce the likelihood of future attacks. If you do not have a doctor who manages your asthma, make an appointment with a primary care doctor to discuss your asthma. SEEK IMMEDIATE MEDICAL CARE IF:   You are getting worse.  You have trouble breathing. If severe, call your local emergency services (911 in the [place]  You develop chest pain or discomfort.  You are vomiting.  You are not able to keep fluids down.  You are coughing up yellow, green, brown, or bloody sputum.  You have a fever and your symptoms suddenly get worse.  You have trouble swallowing. MAKE SURE YOU:   Understand these instructions.  Will watch your condition.  Will get help right away if you are not doing well or get worse. Document Released: 12/13/2006 Document Revised: 04/30/2013 Document Reviewed: 03/05/2013 [facility] Patient Information 2014 [employer] [place].

## 2013-12-31 NOTE — Assessment & Plan Note (Signed)
She has reactive airway with coughing triggered by deep breath.  She will continue using albuterol inhaler PRN for SOB She will use Ricola and Robitussin for her cough

## 2013-12-31 NOTE — Assessment & Plan Note (Addendum)
Pt encouraged to use nasonex daily. She likely has allergic rhinitis but has had chronic sinusitis. She does not have sinus pain/pressure during this visit with no indication for Abx treatment--she agreed. She has been seen by ENT in the past.  -Pt to start Zyrtec 10mg  daily for allergic rhinitis -Referred to ENT for chronic sinusitis--[facility] -Pt to continue saline nasal irrigation with saline nasal spray 4 times daily -Pt advised to follow up with us if her symptoms worsen or do not improve

## 2013-12-31 NOTE — Progress Notes (Signed)
   Subjective:    Patient ID: Briana Parker, female    DOB: 10/30/1965, 48 y.o.   MRN: [id number]  Sinus Problem Associated symptoms include congestion, coughing, shortness of breath and a sore throat. Pertinent negatives include no chills or headaches.   [name] is a 48 yr old woman with PMH of chronic sinusitis who presents for reevaluation of URI symptoms. [facility] and diagnosed with acute bacterial sinusitis. She was given prescription for Augmentin x 10 days but developed diarrhea the first day she tool this medicine and stopped this Abx on the second day of treatment. She states that her sinus pain/pressure and the thick yellow/green nasal discharge has pretty much resolved by now but she continues to have post-nasal drip and a cough productive of scant green phlegm that causes chest discomfort. She notes that taking a deep breath triggers her cough. She also complains of a sore throat but denies sick contact or exposure to strep throat. She has been using Flonase nasal spray but not every day. She has been using saline irrigation spray every day and noticed improvement of her nasal congestion.    Review of Systems  Constitutional: Negative for fever, chills, activity change and appetite change.       Denies body aches  HENT: Positive for congestion, postnasal drip, rhinorrhea, sore throat and voice change. Negative for nosebleeds.   Eyes: Negative for itching.  Respiratory: Positive for cough and shortness of breath. Negative for wheezing.   Cardiovascular: Positive for chest pain. Negative for palpitations and leg swelling.  Gastrointestinal: Negative for abdominal pain, diarrhea and constipation.  Genitourinary: Negative for dysuria.  Neurological: Negative for dizziness, light-headedness and headaches.  Hematological: Negative for adenopathy.  Psychiatric/Behavioral: Negative for agitation.       Objective:   Physical Exam  Nursing note  and vitals reviewed. Constitutional: She is oriented to person, place, and time. She appears well-developed and well-nourished. No distress.  HENT:  Mouth/Throat: Oropharynx is clear and moist. No oropharyngeal exudate.  Nasal turbinates boggy and pale bilaterally with no discharge  Eyes: No scleral icterus.  Cardiovascular: Normal rate and regular rhythm.   Pulmonary/Chest: Effort normal. No respiratory distress. She has no wheezes. She has no rales.  Musculoskeletal: She exhibits no edema and no tenderness.  Lymphadenopathy:    She has no cervical adenopathy.  Neurological: She is alert and oriented to person, place, and time.  Skin: Skin is warm and dry. She is not diaphoretic.  Psychiatric: She has a normal mood and affect.  Mildly anxious appearing          Assessment & Plan:

## 2014-01-01 NOTE — Progress Notes (Addendum)
Case discussed with Dr. Garald BraverKennerly soon after the resident saw the patient.  We reviewed the resident's history and exam and pertinent patient test results.  I agree with the assessment, diagnosis and plan of care documented in the resident's note.  Flexeril was refilled for her chronic back pain, doxepin was refilled for her depression, nexium was refilled for her GERD, and crestor was refilled for her hyperlipidemia.

## 2014-02-05 ENCOUNTER — Other Ambulatory Visit (INDEPENDENT_AMBULATORY_CARE_PROVIDER_SITE_OTHER): Payer: No Typology Code available for payment source

## 2014-02-05 DIAGNOSIS — Z1211 Encounter for screening for malignant neoplasm of colon: Secondary | ICD-10-CM

## 2014-02-05 DIAGNOSIS — R195 Other fecal abnormalities: Secondary | ICD-10-CM

## 2014-02-05 LAB — POC HEMOCCULT BLD/STL (HOME/3-CARD/SCREEN)
Card #2 Fecal Occult Blod, POC: POSITIVE
Card #3 Fecal Occult Blood, POC: POSITIVE
FECAL OCCULT BLD: NEGATIVE

## 2014-02-05 NOTE — Progress Notes (Signed)
Pt here to return hemoccult cards.  Heme positive x 2. GI referral placed an sent to PCP for cosign.Briana C Goldston5/28/20152:33 PM

## 2014-02-09 ENCOUNTER — Encounter: Payer: Self-pay | Admitting: Licensed Clinical Social Worker

## 2014-02-10 ENCOUNTER — Encounter: Payer: Self-pay | Admitting: Internal Medicine

## 2014-02-26 ENCOUNTER — Encounter: Payer: Self-pay | Admitting: Licensed Clinical Social Worker

## 2014-03-10 NOTE — Progress Notes (Signed)
(  Late Entry)Call from pt last week stating she wishes to hold off of the colonoscopy and repeat stool cards. States she was having some GI issues at the time the test.  Pt to discuss at follow up visit, appt canceled per pt's request.Goldston, Darlene Cassady6/30/20152:58 PM

## 2014-03-12 ENCOUNTER — Ambulatory Visit (AMBULATORY_SURGERY_CENTER): Payer: Self-pay | Admitting: *Deleted

## 2014-03-12 VITALS — Ht 62.0 in | Wt 138.0 lb

## 2014-03-12 DIAGNOSIS — Z1211 Encounter for screening for malignant neoplasm of colon: Secondary | ICD-10-CM

## 2014-03-12 MED ORDER — POLYETHYLENE GLYCOL 3350 17 GM/SCOOP PO POWD: 1.0000 | Freq: Every day | ORAL | Status: DC

## 2014-03-12 NOTE — Progress Notes (Signed)
No egg or soy allergy. No anesthesia problems.  No home O2.  No diet meds.  

## 2014-03-18 ENCOUNTER — Encounter: Payer: Self-pay | Admitting: Internal Medicine

## 2014-03-18 ENCOUNTER — Ambulatory Visit (AMBULATORY_SURGERY_CENTER): Payer: Self-pay | Admitting: Internal Medicine

## 2014-03-18 ENCOUNTER — Encounter: Payer: No Typology Code available for payment source | Admitting: Internal Medicine

## 2014-03-18 VITALS — BP 119/81 | HR 80 | Temp 98.0°F | Resp 15 | Ht 62.0 in | Wt 138.0 lb

## 2014-03-18 DIAGNOSIS — Z1211 Encounter for screening for malignant neoplasm of colon: Secondary | ICD-10-CM

## 2014-03-18 MED ORDER — SODIUM CHLORIDE 0.9 % IV SOLN: 500.0000 mL | INTRAVENOUS | Status: DC

## 2014-03-18 NOTE — Progress Notes (Signed)
Report to PACU, RN, vss, BBS= Clear.  

## 2014-03-18 NOTE — Op Note (Addendum)
[facility] [address] [place] [id number]   COLONOSCOPY PROCEDURE REPORT  PATIENT: Briana Parker, Briana T.  MR#: [id number] BIRTHDATE: 26-Apr-1966 , 48  yrs. old GENDER: Female ENDOSCOPIST: Hart Carwinora M Trentyn Boisclair, MD REFERRED BY:Dr Ruffin FrederickErik Hoffman,DO PROCEDURE DATE:  03/18/2014 PROCEDURE:   Colonoscopy, screening First Screening Colonoscopy - Avg.  risk and is 50 yrs.  old or older Yes.  Prior Negative Screening - Now for repeat screening. N/A  History of Adenoma - Now for follow-up colonoscopy & has been > or = to 3 yrs.  N/A  Polyps Removed Today? No.  Recommend repeat exam, <10 yrs? No. ASA CLASS:   Class I INDICATIONS:Average risk patient for colon cancer. MEDICATIONS: MAC sedation, administered by CRNA and propofol (Diprivan) 200mg  IV  DESCRIPTION OF PROCEDURE:   After the risks benefits and alternatives of the procedure were thoroughly explained, informed consent was obtained.  A digital rectal exam revealed no abnormalities of the rectum.   The LB [id number]-[id number]  endoscope was introduced through the anus and advanced to the cecum, which was identified by both the appendix and ileocecal valve. No adverse events experienced.   The quality of the prep was good, using MoviPrep  The instrument was then slowly withdrawn as the colon was fully examined.      COLON FINDINGS: A normal appearing cecum, ileocecal valve, and appendiceal orifice were identified.  The ascending, hepatic flexure, transverse, splenic flexure, descending, sigmoid colon and rectum appeared unremarkable.  No polyps or cancers were seen. Retroflexed views revealed no abnormalities. The time to cecum=3 minutes 31 seconds.  Withdrawal time=6 minutes 09 seconds.  The scope was withdrawn and the procedure completed. COMPLICATIONS: There were no complications.  ENDOSCOPIC IMPRESSION: Normal colon  RECOMMENDATIONS: high fiber diet Recall colonoscopy in 10 years   eSigned:  Hart Carwinora M Nylia Gavina, MD  03/18/2014 3:49 PM   cc:

## 2014-03-18 NOTE — Patient Instructions (Signed)
Discharge instructions given with verbal understanding. Normal exam. Resume previous medications. YOU HAD AN ENDOSCOPIC PROCEDURE TODAY AT THE [facility]: Refer to the procedure report that was given to you for any specific questions about what was found during the examination.  If the procedure report does not answer your questions, please call your gastroenterologist to clarify.  If you requested that your care partner not be given the details of your procedure findings, then the procedure report has been included in a sealed envelope for you to review at your convenience later.  YOU SHOULD EXPECT: Some feelings of bloating in the abdomen. Passage of more gas than usual.  Walking can help get rid of the air that was put into your GI tract during the procedure and reduce the bloating. If you had a lower endoscopy (such as a colonoscopy or flexible sigmoidoscopy) you may notice spotting of blood in your stool or on the toilet paper. If you underwent a bowel prep for your procedure, then you may not have a normal bowel movement for a few days.  DIET: Your first meal following the procedure should be a light meal and then it is ok to progress to your normal diet.  A half-sandwich or bowl of soup is an example of a good first meal.  Heavy or fried foods are harder to digest and may make you feel nauseous or bloated.  Likewise meals heavy in dairy and vegetables can cause extra gas to form and this can also increase the bloating.  Drink plenty of fluids but you should avoid alcoholic beverages for 24 hours.  ACTIVITY: Your care partner should take you home directly after the procedure.  You should plan to take it easy, moving slowly for the rest of the day.  You can resume normal activity the day after the procedure however you should NOT DRIVE or use heavy machinery for 24 hours (because of the sedation medicines used during the test).    SYMPTOMS TO REPORT IMMEDIATELY: A gastroenterologist  can be reached at any hour.  During normal business hours, 8:30 AM to 5:00 PM Monday through Friday, call [phone].  After hours and on weekends, please call the GI answering service at [phone] who will take a message and have the physician on call contact you.   Following lower endoscopy (colonoscopy or flexible sigmoidoscopy):  Excessive amounts of blood in the stool  Significant tenderness or worsening of abdominal pains  Swelling of the abdomen that is new, acute  Fever of 100F or higher  FOLLOW UP: If any biopsies were taken you will be contacted by phone or by letter within the next 1-3 weeks.  Call your gastroenterologist if you have not heard about the biopsies in 3 weeks.  Our staff will call the home number listed on your records the next business day following your procedure to check on you and address any questions or concerns that you may have at that time regarding the information given to you following your procedure. This is a courtesy call and so if there is no answer at the home number and we have not heard from you through the emergency physician on call, we will assume that you have returned to your regular daily activities without incident.  SIGNATURES/CONFIDENTIALITY: You and/or your care partner have signed paperwork which will be entered into your electronic medical record.  These signatures attest to the fact that that the information above on your After Visit Summary has been reviewed   and is understood.  Full responsibility of the confidentiality of this discharge information lies with you and/or your care-partner. 

## 2014-03-19 ENCOUNTER — Telehealth: Payer: Self-pay | Admitting: *Deleted

## 2014-03-19 NOTE — Telephone Encounter (Signed)
No identifier, left message, follow-up  

## 2014-03-20 ENCOUNTER — Ambulatory Visit: Payer: No Typology Code available for payment source

## 2014-03-24 ENCOUNTER — Other Ambulatory Visit: Payer: Self-pay | Admitting: *Deleted

## 2014-03-24 MED ORDER — ALBUTEROL SULFATE HFA 108 (90 BASE) MCG/ACT IN AERS: 2.0000 | INHALATION_SPRAY | Freq: Four times a day (QID) | RESPIRATORY_TRACT | Status: DC | PRN

## 2014-03-24 NOTE — Telephone Encounter (Signed)
Called to pharm 

## 2014-03-25 ENCOUNTER — Other Ambulatory Visit: Payer: Self-pay | Admitting: *Deleted

## 2014-03-26 MED ORDER — ESTROGENS, CONJUGATED 0.625 MG/GM VA CREA: TOPICAL_CREAM | VAGINAL | Status: DC

## 2014-03-26 NOTE — Telephone Encounter (Signed)
Needs to be called in or pharmacy updated in chart.

## 2014-03-26 NOTE — Telephone Encounter (Signed)
Called to [facility] 

## 2014-04-06 ENCOUNTER — Telehealth: Payer: Self-pay | Admitting: Licensed Clinical Social Worker

## 2014-04-06 NOTE — Telephone Encounter (Signed)
Pt requesting CSW to mail voucher.  However, in looking at instruction, must be order from agency account.  CSW notify pt.

## 2014-04-06 NOTE — Telephone Encounter (Signed)
CSW placed call to Ms. Sheptock.   [facility].  Pt can either utilize voucher from her computer or meet with CSW to order.

## 2014-04-06 NOTE — Telephone Encounter (Signed)
Unclear if pt is able to order.  CSW will send information for pt to try to order on her own and offer standby assistance.

## 2014-04-09 ENCOUNTER — Encounter: Payer: Self-pay | Admitting: Internal Medicine

## 2014-04-10 ENCOUNTER — Telehealth: Payer: Self-pay | Admitting: *Deleted

## 2014-04-10 NOTE — Telephone Encounter (Signed)
Pt called -  Having problems with hot flashes and mood swings. Pt thinks she is going through menopause. Problems again with pain. [facility] [employer] [employer]. Appt made to discuss problems - possible referrals. Appt sch 04/15/14 10:15AM Dr Delane GingerGill. Stanton KidneyDebra Trey Gulbranson RN 04/10/14 3:45PM

## 2014-04-15 ENCOUNTER — Encounter: Payer: Self-pay | Admitting: Licensed Clinical Social Worker

## 2014-04-15 ENCOUNTER — Ambulatory Visit (INDEPENDENT_AMBULATORY_CARE_PROVIDER_SITE_OTHER): Payer: No Typology Code available for payment source | Admitting: Internal Medicine

## 2014-04-15 ENCOUNTER — Encounter: Payer: Self-pay | Admitting: Internal Medicine

## 2014-04-15 VITALS — BP 117/79 | HR 66 | Temp 98.1°F | Ht 62.0 in | Wt 138.5 lb

## 2014-04-15 DIAGNOSIS — N951 Menopausal and female climacteric states: Secondary | ICD-10-CM

## 2014-04-15 LAB — TSH: TSH: 2.035 u[IU]/mL (ref 0.350–4.500)

## 2014-04-15 MED ORDER — VENLAFAXINE HCL ER 75 MG PO CP24: 75.0000 mg | ORAL_CAPSULE | Freq: Every day | ORAL | Status: DC

## 2014-04-15 NOTE — Progress Notes (Signed)
Patient ID: Briana Parker, female   DOB: 04/19/1966, 48 y.o.   MRN: [id number]    Subjective:   Patient ID: Briana Parker female    DOB: 02/28/1966 48 y.o.    MRN: [id number] Health Maintenance Due: Health Maintenance Due  Topic Date Due  . Pap Smear  10/10/2005  . Influenza Vaccine  04/11/2014    _________________________________________________  HPI: Briana Parker is a 48 y.o. female here for an acute visit.  Pt has a PMH outlined below.  Please see problem-based charting assessment and plan note for further details of medical issues addressed at today's visit.  PMH: Past Medical History  Diagnosis Date  . Fibromyalgia   . Hyperlipidemia   . Low back pain   . Allergic rhinitis   . Allergy     seasonal  . Arthritis     back  . Chronic bronchitis   . GERD (gastroesophageal reflux disease)     Medications: Current Outpatient Prescriptions on File Prior to Visit  Medication Sig Dispense Refill  . albuterol (PROVENTIL HFA;VENTOLIN HFA) 108 (90 BASE) MCG/ACT inhaler Inhale 2 puffs into the lungs 4 (four) times daily as needed for wheezing or shortness of breath.  3 Inhaler  1  . carboxymethylcellulose (LUBRICANT EYE DROPS) 0.5 % SOLN Place 2 drops into both eyes 3 (three) times daily as needed.  15 mL  0  . cetirizine (ZYRTEC) 10 MG tablet Take 1 tablet (10 mg total) by mouth daily.  90 tablet  6  . conjugated estrogens (PREMARIN) vaginal cream Use 1 applicationful every night for 21 nights,then stop for 7 night.  42.5 g  0  . cyclobenzaprine (FLEXERIL) 10 MG tablet Take 1 tablet (10 mg total) by mouth 3 (three) times daily as needed.  90 tablet  5  . doxepin (SINEQUAN) 25 MG capsule Take 1 capsule (25 mg total) by mouth at bedtime.  30 capsule  5  . esomeprazole (NEXIUM) 40 MG capsule Take 1 capsule (40 mg total) by mouth daily before breakfast.  90 capsule  2  . mometasone (NASONEX) 50 MCG/ACT nasal spray Place 2 sprays into the nose daily.  17 g  2  .  rosuvastatin (CRESTOR) 10 MG tablet Take 1 tablet (10 mg total) by mouth daily.  90 tablet  5  . senna-docusate (SENOKOT-S) 8.6-50 MG per tablet Take 1 tablet by mouth daily. Take one tablet every 12 hours as needed for constipation        No current facility-administered medications on file prior to visit.    Allergies: Allergies  Allergen Reactions  . Augmentin [Amoxicillin-Pot Clavulanate] Diarrhea  . Codeine     REACTION: unknown reaction  . Hydrocodone   . Pravastatin Sodium     REACTION: Pruritis and muscle pain  . Simvastatin     REACTION: Abdominal pain, nausea, vomitting    FH: Family History  Problem Relation Age of Onset  . Coronary artery disease      Strong family history in first degree relatives  . Diabetes Mother     died recently in 01/2010 due to 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  She was on dialysis and died with cardiac arrest as per pt.  . Heart disease Father   . Hypertension Father   . Colon cancer Neg Hx     SH: History   Social History  . Marital Status: Married    Spouse Name: N/A    Number of Children: N/A  . Years of Education:  N/A   Social History Main Topics  . Smoking status: Never Smoker   . Smokeless tobacco: Never Used  . Alcohol Use: No  . Drug Use: No  . Sexual Activity: Not on file   Other Topics Concern  . Not on file   Social History Narrative   Exercises Regularly, currently takes care of one child.  Lives at home with daughter.    Review of Systems: Constitutional: Negative for fever, chills and weight loss.  Eyes: Negative for blurred vision.  Respiratory: Negative for cough and shortness of breath.  Cardiovascular: Negative for chest pain, palpitations and leg swelling.  Gastrointestinal: Negative for nausea, vomiting, abdominal pain, diarrhea, constipation and blood in stool.  Genitourinary: Negative for dysuria, urgency and frequency.  Musculoskeletal: Negative for myalgias and back pain.  Neurological: Negative for  dizziness, weakness and headaches.     Objective:   Vital Signs: There were no vitals filed for this visit.    BP Readings from Last 3 Encounters:  03/18/14 119/81  12/29/13 122/82  12/10/13 121/82    Physical Exam: Constitutional: Vital signs reviewed.  Patient is well-developed and well-nourished in NAD and cooperative with exam.  Head: Normocephalic and atraumatic. Eyes: PERRL, EOMI, conjunctivae nl, no scleral icterus.  Neck: Supple. Cardiovascular: RRR, no MRG. Pulmonary/Chest: normal effort, non-tender to palpation, CTAB, no wheezes, rales, or rhonchi. Abdominal: Soft. NT/ND +BS. Neurological: A&O x3, cranial nerves II-XII are grossly intact, moving all extremities. Extremities: 2+DP b/l; no pitting edema. Skin: Warm, dry and intact. No rash.   Assessment & Plan:   Assessment and plan was discussed and formulated with my attending.

## 2014-04-15 NOTE — Progress Notes (Signed)
[name] presents today before her scheduled [facility] appointment to redeem voucher for eyeglasses.  CSW met with [name] and completed [employer] with [employer].  Pt chose eyeglasses, information entered and submitted.  Pt aware eyeglasses to arrive at her home within 14 days.  [name] to notify CSW if glasses do not arrive.  Email confirmation of order received.

## 2014-04-15 NOTE — Progress Notes (Signed)
Case discussed with Dr. Gill at the time of the visit.  We reviewed the resident's history and exam and pertinent patient test results.  I agree with the assessment, diagnosis and plan of care documented in the resident's note. 

## 2014-04-15 NOTE — Assessment & Plan Note (Signed)
Pt p/w hot flashes, crying spells, lashing out at people/irriatability, difficulty sleeping, depression (no SI), and decreased libido.  She reports these symptoms have been going on since April 2015.  She has tried OTC i-cool (derived from soy) for symptoms which does not seem to be helping.  She just got married recently and has no interest in sex.  She uses estrogen vaginal cream occasionally and has also tried lubricants.  She describes no interest in sex but denies any dyspareunia.  Reports that her mother died in 2011 and occasionally has crying spells regarding this.  However, she reports that her increased irritability, mood swings, difficulty sleeping, and decreased libido happened recently (April 2015).  She reports that her appetite is OK and that is "comes and goes."  Denies any difficulty concentrating or problems with her memory.  Physical exam is unremarkable.  Denies alcohol or recreational drug use.  She does not like to take medications but is amenable to trying something to help with her symptoms.   -trial of effexor XR 75mg   -return to clinic for follow-up in 6-8 weeks and reassess  -continue using lubricants as needed -check TSH  -advised to call the clinic if she has any additional concerns  -may consider mental health counseling in the future if symptoms persist

## 2014-04-15 NOTE — Patient Instructions (Signed)
Thank you for your visit today.   Please return to the internal medicine clinic in 6-8 weeks month(s) or sooner if needed.     Your current medical regimen is effective;  continue present plan and take all medications as prescribed.    I have made the following additions/changes to your medications: added effexor for your symptoms, please take once daily. I have [facility].   Please be sure to bring all of your medications with you to every visit; this includes herbal supplements, vitamins, eye drops, and any over-the-counter medications.   Should you have any questions regarding your medications and/or any new or worsening symptoms, please be sure to call the clinic at [phone].   If you believe that you are suffering from a life threatening condition or one that may result in the loss of limb or function, then you should call 911 or proceed to the nearest Emergency Department.     A healthy lifestyle and preventative care can promote health and wellness.   Maintain regular health, dental, and eye exams.  Eat a healthy diet. Foods like vegetables, fruits, whole grains, low-fat dairy products, and lean protein foods contain the nutrients you need without too many calories. Decrease your intake of foods high in solid fats, added sugars, and salt. Get information about a proper diet from your caregiver, if necessary.  Regular physical exercise is one of the most important things you can do for your health. Most adults should get at least 150 minutes of moderate-intensity exercise (any activity that increases your heart rate and causes you to sweat) each week. In addition, most adults need muscle-strengthening exercises on 2 or more days a week.   Maintain a healthy weight. The body mass index (BMI) is a screening tool to identify possible weight problems. It provides an estimate of body fat based on height and weight. Your caregiver can help determine  your BMI, and can help you achieve or maintain a healthy weight. For adults 20 years and older:  A BMI below 18.5 is considered underweight.  A BMI of 18.5 to 24.9 is normal.  A BMI of 25 to 29.9 is considered overweight.  A BMI of 30 and above is considered obese.  Menopause Menopause is the normal time of life when menstrual periods stop completely. Menopause is complete when you have missed 12 consecutive menstrual periods. It usually occurs between the ages of 48 years and 55 years. Very rarely does a woman develop menopause before the age of 40 years. At menopause, your ovaries stop producing the female hormones estrogen and progesterone. This can cause undesirable symptoms and also affect your health. Sometimes the symptoms may occur 4-5 years before the menopause begins. There is no relationship between menopause and:  Oral contraceptives.  Number of children you had.  Race.  The age your menstrual periods started (menarche). Heavy smokers and very thin women may develop menopause earlier in life. CAUSES  The ovaries stop producing the female hormones estrogen and progesterone.  Other causes include:  Surgery to remove both ovaries.  The ovaries stop functioning for no known reason.  Tumors of the pituitary gland in the brain.  Medical disease that affects the ovaries and hormone production.  Radiation treatment to the abdomen or pelvis.  Chemotherapy that affects the ovaries. SYMPTOMS   Hot flashes.  Night sweats.  Decrease in sex drive.  Vaginal dryness and thinning of the vagina causing painful intercourse.  Dryness of the  skin and developing wrinkles.  Headaches.  Tiredness.  Irritability.  Memory problems.  Weight gain.  Bladder infections.  Hair growth of the face and chest.  Infertility. More serious symptoms include:  Loss of bone (osteoporosis) causing breaks (fractures).  Depression.  Hardening and narrowing of the arteries  (atherosclerosis) causing heart attacks and strokes. DIAGNOSIS   When the menstrual periods have stopped for 12 straight months.  Physical exam.  Hormone studies of the blood. TREATMENT  There are many treatment choices and nearly as many questions about them. The decisions to treat or not to treat menopausal changes is an individual choice made with your health care provider. Your health care provider can discuss the treatments with you. Together, you can decide which treatment will work best for you. Your treatment choices may include:   Hormone therapy (estrogen and progesterone).  Non-hormonal medicines.  Treating the individual symptoms with medicine (for example antidepressants for depression).  Herbal medicines that may help specific symptoms.  Counseling by a psychiatrist or psychologist.  Group therapy.  Lifestyle changes including:  Eating healthy.  Regular exercise.  Limiting caffeine and alcohol.  Stress management and meditation.  No treatment. HOME CARE INSTRUCTIONS   Take the medicine your health care provider gives you as directed.  Get plenty of sleep and rest.  Exercise regularly.  Eat a diet that contains calcium (good for the bones) and soy products (acts like estrogen hormone).  Avoid alcoholic beverages.  Do not smoke.  If you have hot flashes, dress in layers.  Take supplements, calcium, and vitamin D to strengthen bones.  You can use over-the-counter lubricants or moisturizers for vaginal dryness.  Group therapy is sometimes very helpful.  Acupuncture may be helpful in some cases. SEEK MEDICAL CARE IF:   You are not sure you are in menopause.  You are having menopausal symptoms and need advice and treatment.  You are still having menstrual periods after age 42 years.  You have pain with intercourse.  Menopause is complete (no menstrual period for 12 months) and you develop vaginal bleeding.  You need a referral to a  specialist (gynecologist, psychiatrist, or psychologist) for treatment. SEEK IMMEDIATE MEDICAL CARE IF:   You have severe depression.  You have excessive vaginal bleeding.  You fell and think you have a broken bone.  You have pain when you urinate.  You develop leg or chest pain.  You have a fast pounding heart beat (palpitations).  You have severe headaches.  You develop vision problems.  You feel a lump in your breast.  You have abdominal pain or severe indigestion. Document Released: 11/18/2003 Document Revised: 04/30/2013 Document Reviewed: 03/27/2013 [facility] Patient Information 2015 [place], [place]. This information is not intended to replace advice given to you by your health care provider. Make sure you discuss any questions you have with your health care provider.

## 2014-05-06 ENCOUNTER — Ambulatory Visit (HOSPITAL_COMMUNITY)
Admission: RE | Admit: 2014-05-06 | Discharge: 2014-05-06 | Disposition: A | Discharge status: Home / Self Care | Payer: No Typology Code available for payment source | Source: Ambulatory Visit | Attending: Internal Medicine | Admitting: Internal Medicine

## 2014-05-06 ENCOUNTER — Other Ambulatory Visit: Payer: Self-pay | Admitting: Internal Medicine

## 2014-05-06 DIAGNOSIS — Z1231 Encounter for screening mammogram for malignant neoplasm of breast: Secondary | ICD-10-CM

## 2014-05-07 ENCOUNTER — Telehealth: Payer: Self-pay | Admitting: *Deleted

## 2014-05-07 ENCOUNTER — Telehealth: Payer: Self-pay | Admitting: Licensed Clinical Social Worker

## 2014-05-07 NOTE — Telephone Encounter (Signed)
I spoke with Briana Parker this am and she coughed and sounded congested during the entire call, stating she is having another "flare-up" of her chronic sinusitis. Albuterol doesn't relive symptoms and either does Zyrtec (has been on Flonase and claritin in the past without relief). Appointment tomorrow in [facility].  CSW working on nebulizer due to inability from lack of insurance or self-pay, needs an order written for the nebulizer should that seem appropriate. Had a referral to ENT and states she was never informed of the appointment and didn't go, needs another referral to ENT of possible through [facility] (which they might not accept), if this also is felt to be appropriate. She has had multiple visits with this problem, has trouble seeing her PCP due to scheduling, and feels she needs to be taken care of and a plan made to deal with her illness when these flare-ups occur. I am routing this to PharmD also, perhaps she may have ideas for inhaler, Antihist/Decon, or antibiotics prn (does state again it's seasonal).

## 2014-05-07 NOTE — Telephone Encounter (Signed)
Received information back from [facility]: Case by case basis. "...need to qualify for charity based on income. You would need to fax in the referral info and the rx for the nebulizer with the diagnosis, and documentation from the office note for why they need a nebulizer."   Pt is scheduled for an appointment on 05/08/14, will have physician follow up on above regarding need.

## 2014-05-07 NOTE — Telephone Encounter (Signed)
Saw Lowell Bouton note and Dr Neita Garnet note 05/07/14. Pt cancel appt 05/08/14 and resch appt 05/11/14 Dr Danella Penton.

## 2014-05-07 NOTE — Telephone Encounter (Signed)
It sounds like Ms Briana Parker may benefit from a nebulizer, given she does not have insurance it will likely be difficult to obtain referrals but we can certainly try through the orange card.  I am glad to see we were able to offer her an appointment tomorrow.  I hope that we will be able to sort this out at that time.  I also just received a request for referral for low back pain which I last evaluated in March.  I am not sure yet if she needs referral and hope Dr. Danella Penton may also be able to evaluate this.

## 2014-05-07 NOTE — Telephone Encounter (Signed)
I agree with the nebulizer option (budesonide is available as nebulizer solution if ICS needs to be added).  Regarding symptom management, I called pt and she discussed her strategies. Outside of what she is already doing (nasal saline, cough suppressant/mucolytic, acetaminophen), I advised pt to d/c cetirizine for the time being to minimize dryness and difficulty clearing the mucus---pt reports adherence to Nasonex which will cover her for now.  She expressed concern about abx therapy and stomach upset, so I advised her that the team will take this into consideration if/when choosing the best abx for her.  Hope this helps!

## 2014-05-07 NOTE — Telephone Encounter (Signed)
Talked with pt per Tobi Bastos. Pt is very upset with clinic on how she is treated. Pt can not understand why she has not heard about referral and getting a nebulizer. Inhaler is not helping.  Pt is coughing and sounds like she has head congestion. Offered appt today - pt is unable. Pt did decide on an appt  05/08/14 2:30PM Dr Danella Penton. Suggest to go to ER if gets worse. Pt also left message for Raynaldo Opitz to call pt per pt request. Nayellie Sanseverino RN 05/07/14 10:15AM

## 2014-05-07 NOTE — Telephone Encounter (Signed)
Noted, thank you, I am glad to see we are able to provide Briana Parker an appointment tomorrow with Dr. Danella Penton.  Hopefully we can adequately address Briana Parker complaints at that time.

## 2014-05-07 NOTE — Telephone Encounter (Signed)
Ms. Margo Aye placed call to CSW.  Pt states every time there is a season change she has difficulty breathing.  Ms. Sheptock stating she thinks she as allergies and asthma and was to see specialist, but does not know the status of this referral.  Pt inquiring is CSW was aware of any agencies that can provide a machine to help her breathe.  CSW inquired with Ms. Sheptock the name of the equipment she was requesting or had been referred for.  Pt unaware, stating the inhalers don't work. And, last time in [facility] "they needed to give me a breathing treatment".   CSW inquired if it were a nebulizer.  Pt unsure but states there was a $100 cost.  CSW will inquire if [facility] if a nebulizer can be provided if this is the equipment of which Ms. Sheptock is in need.  Will forward this to PCP for clarification of needed equipment.

## 2014-05-07 NOTE — Telephone Encounter (Signed)
Talked with pt 05/07/14 and offered appt today - pt decided to come 05/08/14 2:30PM Dr Danella Penton.. Also Raynaldo Opitz is to call pt - pt is very upset with clinic and how she is treated.

## 2014-05-08 ENCOUNTER — Ambulatory Visit: Payer: Self-pay | Admitting: Internal Medicine

## 2014-05-11 ENCOUNTER — Telehealth: Payer: Self-pay | Admitting: *Deleted

## 2014-05-11 ENCOUNTER — Ambulatory Visit (INDEPENDENT_AMBULATORY_CARE_PROVIDER_SITE_OTHER): Payer: No Typology Code available for payment source | Admitting: Internal Medicine

## 2014-05-11 VITALS — BP 117/73 | HR 98 | Temp 98.8°F | Wt 141.1 lb

## 2014-05-11 DIAGNOSIS — J0141 Acute recurrent pansinusitis: Secondary | ICD-10-CM | POA: Insufficient documentation

## 2014-05-11 DIAGNOSIS — J018 Other acute sinusitis: Secondary | ICD-10-CM

## 2014-05-11 DIAGNOSIS — M545 Low back pain, unspecified: Secondary | ICD-10-CM

## 2014-05-11 MED ORDER — GUAIFENESIN-CODEINE 100-10 MG/5ML PO SOLN: 10.0000 mL | ORAL | Status: DC | PRN

## 2014-05-11 MED ORDER — NAPROXEN 500 MG PO TABS: ORAL_TABLET | ORAL | Status: DC

## 2014-05-11 MED ORDER — NAPROXEN 500 MG PO TABS: 500.0000 mg | ORAL_TABLET | Freq: Two times a day (BID) | ORAL | Status: DC

## 2014-05-11 MED ORDER — SULFAMETHOXAZOLE-TMP DS 800-160 MG PO TABS: 1.0000 | ORAL_TABLET | Freq: Two times a day (BID) | ORAL | Status: DC

## 2014-05-11 NOTE — Progress Notes (Signed)
Subjective:     Patient ID: Briana Parker, female   DOB: 1966-06-08, 48 y.o.   MRN: [id number]  HPI Pt is a 48 y/o female w/ PMHx of asthma, sinusitis, HLD, and low back pain who presents to clinic for coughing x 3 weeks. Pt has a long hx of sinusitis and has seen ENT when she was in her 30's and does not remember details of visit. She usually gets sinus problems 3 times a years. She has been coughing for the past 3 weeks with post nasal drip and sputum production that is green. She has facial pain and has recently start to feel pressure behind her eyes. She has decreased sense of smell and taste, fatigue, and difficulty breathing especially at night. Starting last Sunday she started to have subjective fevers, NS, and chills. She reports that the only thing that helps w/ her congestion is nasal irrigation which she does about 5-6 times a day with relief for only a short time. She uses her albuterol inhaler 4 times a day that she states does not work. She has been using nasonex once a day and stopped using zyrtec as instructed per Dr. Selena Batten on 8/27. She was previously prescribed augmentin for bacterial sinusitis but could not tolerate med due to diarrhea and nausea and stopped taking it after one day. She was then recently treated for allergic rhinitis w/ zyrtec which she has been off of now for since 8/27. She was previously referred to ENT but per chart review it seems like she was unable to go to the appointment. She is requesting another referral to ENT.   Pt also has been taking a family member's naproxen 500 mg which she states helps significantly with her back pain. She cannot tolerate ibuprofen and states that tylenol does not help. She is requesting referral to sports medicine.   Review of Systems  Constitutional: Positive for fever, chills and fatigue.  HENT: Positive for congestion, postnasal drip, rhinorrhea and sinus pressure. Negative for ear pain.   Eyes: Positive for pain. Negative for  visual disturbance.       Dry eyes   Respiratory: Positive for cough and shortness of breath.        Objective:   Physical Exam  HENT:  Post nasal drip noted, no exudates. Tender to palpation of maxillary, ethmoid, and frontal sinuses. Inflamed turbinates b/l   Neck: Neck supple.  Cardiovascular: Normal rate and regular rhythm.   Pulmonary/Chest:  Pt had difficult time on deep inspiration/expiration due to coughing, no wheezing or crackles noted   Abdominal: Soft. Bowel sounds are normal.  Lymphadenopathy:    She has no cervical adenopathy.       Assessment:     Please see problem based assessment and plan.        Plan:     Please see problem based assessment and plan.

## 2014-05-11 NOTE — Assessment & Plan Note (Addendum)
Pt has hx of recurrent sinusitis. This current episode feels the same as her previous episodes of sinusitis in the past. She reports coughing for 3 weeks with a fever that started last week. Will treat for acute sinuitis.  - rx for bactrim DS BID x 10 days, informed pt that she will need to call clinic if she cannot tolerate abx. Will f/u in 10 days to reassess if pt should continue an additional 2 week course of abx after which we will then consider CT imaging of sinuses - rx for guaifenesin-codeine q4hs as needed for cough - pt was unable to keep her appt w/ ENT on 6/16, will have to wait until next year before another referral can be made   Addendum 05/20/14 : rx for albuterol nebulizer as patient notes improvement in symptoms after she received nebulizing treatment during her last office visit.

## 2014-05-11 NOTE — Assessment & Plan Note (Addendum)
Pt complains of lower back pain that is relieved w/ naproxen . Her Cr fxn has been stable. Will give rx for naproxen  BID and will reassess back pain at follow up with her PCP on Oct 15th. Referral made for sports medicine.

## 2014-05-11 NOTE — Patient Instructions (Signed)
Start taking Bactrim DS twice a day for your acute sinusitis. If you are not able to tolerate this medication please call the clinic to let us know.

## 2014-05-11 NOTE — Telephone Encounter (Signed)
[facility] calls to verify script for naproxen, you gave #60, take 1 tablet once daily. Shouldn't it have been take 1 tablet twice daily? i gave them a verbal ok for twice daily, is this ok with you? i checked w/ dr Criselda Peaches. If you are ok with this please change the med list to reflect twice daily

## 2014-05-12 NOTE — Addendum Note (Signed)
Addended by: Debe Coder B on: 05/12/2014 11:15 AM   Modules accepted: Level of Service

## 2014-05-12 NOTE — Progress Notes (Signed)
Internal Medicine Clinic Attending Date of visit: 05/11/2014  I saw and evaluated the patient.  I personally confirmed the key portions of the history and exam documented by Dr. Truong and I reviewed pertinent patient test results.  The assessment, diagnosis, and plan were formulated together and I agree with the documentation in the resident's note. 

## 2014-05-13 NOTE — Telephone Encounter (Signed)
Pt maintained an appointment this week with [facility].  No documentation regarding the need for any DME.  CSW has sent note to most recent provider to determine if pt has any needs.

## 2014-05-20 ENCOUNTER — Other Ambulatory Visit: Payer: Self-pay | Admitting: Internal Medicine

## 2014-05-20 ENCOUNTER — Telehealth: Payer: Self-pay | Admitting: Licensed Clinical Social Worker

## 2014-05-20 DIAGNOSIS — J0141 Acute recurrent pansinusitis: Secondary | ICD-10-CM

## 2014-05-20 MED ORDER — ALBUTEROL SULFATE (2.5 MG/3ML) 0.083% IN NEBU: 2.5000 mg | INHALATION_SOLUTION | Freq: Four times a day (QID) | RESPIRATORY_TRACT | Status: DC | PRN

## 2014-05-20 NOTE — Telephone Encounter (Signed)
[name] placed call to CSW.  "The doctor said you were going to get me the machine."  [name] inquiring with CSW as to the status of her nebulizer and the medication.  CSW notified [name] as of today, there is no DME order for a nebulizer on her chart, nor is there any documentation of the medical need for nebulizer that CSW is aware of in chart for most recent visit.  CSW informed [name] of agency available to provide charity nebulizer, however medical need must be documented and order placed.  Pt states "they know I need one, I've been coming there for the same problem".  Pt aware CSW will forward this to PCP.  Pt requesting return call regarding determination.

## 2014-05-21 NOTE — Telephone Encounter (Signed)
CSW placed call to Briana Parker.  Pt notified order for nebulizer is on chart and CSW will forward to [facility] DME on Friday 05/22/14.  [facility] liaison will return on 05/22/14 to assist with processing the charity nebulizer.  CSW informed pt, nursing received call from [facility], stating prescription is not carried at their location.  CSW informed Briana Parker nebulizer prescription on chart can be obtained through local pharmacies between $4-$13 utilized [facility].

## 2014-05-21 NOTE — Addendum Note (Signed)
Addended by: Neomia Dear on: 05/21/2014 07:17 PM   Modules accepted: Orders

## 2014-05-22 NOTE — Telephone Encounter (Signed)
Uninsured DME order faxed to [facility]

## 2014-05-25 ENCOUNTER — Other Ambulatory Visit: Payer: Self-pay | Admitting: *Deleted

## 2014-05-25 ENCOUNTER — Telehealth: Payer: Self-pay | Admitting: Licensed Clinical Social Worker

## 2014-05-25 DIAGNOSIS — J0141 Acute recurrent pansinusitis: Secondary | ICD-10-CM

## 2014-05-25 MED ORDER — ALBUTEROL SULFATE (2.5 MG/3ML) 0.083% IN NEBU: 2.5000 mg | INHALATION_SOLUTION | Freq: Four times a day (QID) | RESPIRATORY_TRACT | Status: DC | PRN

## 2014-05-25 NOTE — Telephone Encounter (Signed)
CSW returned call to Briana Parker.  [facility] delivered nebulizer on Friday 9/11.  Pt inquiring how she will obtain the Rx for the solution and where to obtain for the most reasonable cost.  CSW utilized [facility].  [facility] [place] indicates: 25 vials (3ml) of albuterol 2.5mg /36ml (generic) to be $4 no coupon necessary.  Pt requesting prescription to be called in to [facility].  Pt notified CSW will forward to nursing.

## 2014-05-25 NOTE — Telephone Encounter (Signed)
Pt needs new Rx sent in

## 2014-05-25 NOTE — Telephone Encounter (Signed)
Rx e-prescribed 05/25/14

## 2014-06-16 ENCOUNTER — Ambulatory Visit (INDEPENDENT_AMBULATORY_CARE_PROVIDER_SITE_OTHER): Payer: Self-pay | Admitting: *Deleted

## 2014-06-16 DIAGNOSIS — Z23 Encounter for immunization: Secondary | ICD-10-CM

## 2014-07-02 ENCOUNTER — Other Ambulatory Visit: Payer: Self-pay | Admitting: *Deleted

## 2014-07-02 DIAGNOSIS — F329 Major depressive disorder, single episode, unspecified: Secondary | ICD-10-CM

## 2014-07-02 DIAGNOSIS — G8929 Other chronic pain: Secondary | ICD-10-CM

## 2014-07-02 DIAGNOSIS — E785 Hyperlipidemia, unspecified: Secondary | ICD-10-CM

## 2014-07-02 DIAGNOSIS — M545 Low back pain, unspecified: Secondary | ICD-10-CM

## 2014-07-02 DIAGNOSIS — F32A Depression, unspecified: Secondary | ICD-10-CM

## 2014-07-02 MED ORDER — MOMETASONE FUROATE 50 MCG/ACT NA SUSP: 2.0000 | Freq: Every day | NASAL | Status: DC

## 2014-07-02 MED ORDER — ESOMEPRAZOLE MAGNESIUM 40 MG PO CPDR: 40.0000 mg | DELAYED_RELEASE_CAPSULE | Freq: Every day | ORAL | Status: DC

## 2014-07-02 MED ORDER — DOXEPIN HCL 25 MG PO CAPS: 25.0000 mg | ORAL_CAPSULE | Freq: Every day | ORAL | Status: DC

## 2014-07-02 MED ORDER — ROSUVASTATIN CALCIUM 10 MG PO TABS: 10.0000 mg | ORAL_TABLET | Freq: Every day | ORAL | Status: DC

## 2014-07-02 MED ORDER — CYCLOBENZAPRINE HCL 10 MG PO TABS: 10.0000 mg | ORAL_TABLET | Freq: Three times a day (TID) | ORAL | Status: DC | PRN

## 2014-07-03 ENCOUNTER — Telehealth: Payer: Self-pay | Admitting: *Deleted

## 2014-07-03 NOTE — Telephone Encounter (Signed)
[facility] not [facility] doxepin, could you change to nortriptyline 25 mg or 50mg ? They carry those, please advise

## 2014-07-04 MED ORDER — NORTRIPTYLINE HCL 25 MG PO CAPS: 25.0000 mg | ORAL_CAPSULE | Freq: Every day | ORAL | Status: DC

## 2014-07-04 NOTE — Telephone Encounter (Signed)
Changed to nortriptyline 25mg  [facility].

## 2014-07-06 ENCOUNTER — Telehealth: Payer: Self-pay | Admitting: *Deleted

## 2014-07-06 MED ORDER — OMEPRAZOLE 40 MG PO CPDR: 40.0000 mg | DELAYED_RELEASE_CAPSULE | Freq: Every day | ORAL | Status: DC

## 2014-07-06 NOTE — Telephone Encounter (Signed)
Called to [facility] 

## 2014-07-06 NOTE — Telephone Encounter (Signed)
[place]  [facility] [facility], discontinue Nexium.

## 2014-07-06 NOTE — Telephone Encounter (Signed)
As of Nov 2015 [facility] will no longer carry Nexium - only one throught [facility] will be Dexilant 30 and 60mg . Pt prefers not to do - reason ?Marland Kitchen.  If pt has Bar Special - which she has - pt can get either  generic Pepcid or generic Prilosec for $6.00 and [facility] . This is ok for pt. Stanton KidneyDebra Kalisi Bevill RN 07/06/14 10:45AM

## 2014-07-09 ENCOUNTER — Encounter: Payer: No Typology Code available for payment source | Admitting: Internal Medicine

## 2014-07-09 ENCOUNTER — Other Ambulatory Visit: Payer: Self-pay | Admitting: *Deleted

## 2014-07-13 NOTE — Telephone Encounter (Signed)
Pt aware - appt made. [name] RN 07/13/14 4:20PM

## 2014-07-15 ENCOUNTER — Telehealth: Payer: Self-pay | Admitting: *Deleted

## 2014-07-15 ENCOUNTER — Encounter: Payer: Self-pay | Admitting: Internal Medicine

## 2014-07-15 ENCOUNTER — Ambulatory Visit (INDEPENDENT_AMBULATORY_CARE_PROVIDER_SITE_OTHER): Payer: Self-pay | Admitting: Internal Medicine

## 2014-07-15 VITALS — BP 123/69 | HR 99 | Temp 98.5°F | Ht 62.0 in | Wt 141.1 lb

## 2014-07-15 DIAGNOSIS — B373 Candidiasis of vulva and vagina: Secondary | ICD-10-CM

## 2014-07-15 DIAGNOSIS — Z0000 Encounter for general adult medical examination without abnormal findings: Secondary | ICD-10-CM

## 2014-07-15 DIAGNOSIS — M545 Low back pain: Secondary | ICD-10-CM

## 2014-07-15 DIAGNOSIS — N951 Menopausal and female climacteric states: Secondary | ICD-10-CM

## 2014-07-15 DIAGNOSIS — B3731 Acute candidiasis of vulva and vagina: Secondary | ICD-10-CM

## 2014-07-15 DIAGNOSIS — K5900 Constipation, unspecified: Secondary | ICD-10-CM

## 2014-07-15 DIAGNOSIS — M5440 Lumbago with sciatica, unspecified side: Secondary | ICD-10-CM

## 2014-07-15 LAB — CERVICOVAGINAL ANCILLARY ONLY
WET PREP (BD AFFIRM): NEGATIVE
Wet Prep (BD Affirm): NEGATIVE
Wet Prep (BD Affirm): POSITIVE — AB

## 2014-07-15 MED ORDER — ESTROGENS, CONJUGATED 0.625 MG/GM VA CREA: TOPICAL_CREAM | VAGINAL | Status: DC

## 2014-07-15 MED ORDER — POLYETHYLENE GLYCOL 3350 17 G PO PACK: 17.0000 g | PACK | Freq: Every day | ORAL | Status: DC

## 2014-07-15 MED ORDER — FLUCONAZOLE 150 MG PO TABS: 150.0000 mg | ORAL_TABLET | Freq: Every day | ORAL | Status: DC

## 2014-07-15 NOTE — Telephone Encounter (Signed)
Yep that's fine

## 2014-07-15 NOTE — Assessment & Plan Note (Addendum)
Did not have time to address today chronic back pain, she will do so on follow up visit with PCP

## 2014-07-15 NOTE — Progress Notes (Signed)
Patient ID: Briana Parker, female   DOB: 01/13/1966, 48 y.o.   MRN: [id number]   Case discussed with Dr. Virgina OrganQureshi at the time of the visit.  We reviewed the resident's history and exam and pertinent patient test results.  I agree with the assessment, diagnosis, and plan of care documented in the resident's note.

## 2014-07-15 NOTE — Assessment & Plan Note (Signed)
Recurrent per patient, multiple episodes. This one likely after recent course of antibiotics. HIV negative 11/2013. A1c is 5.9 in 2014. Visible white plaques on vaginal examination today.   -wet prep -fluconazole 150mg  x1 x3 doses q72 hours -advised to stop using vaginal wash and just try water to see if maybe that will help

## 2014-07-15 NOTE — Patient Instructions (Signed)
Thank you for coming in today  We will call you with the result  I have called in your premarin cream and we can try miralax powder for constipation  I have also prescribed fluconazole for yeast infection, take one tablet today, and one more tablet 72 hours later  Conjugated Estrogens vaginal cream What is this medicine? CONJUGATED ESTROGENS (CON ju gate ed ESS troe jenz) are a mixture of female hormones. This cream can help relieve symptoms associated with menopause.like vaginal dryness and irritation. This medicine may be used for other purposes; ask your health care provider or pharmacist if you have questions. COMMON BRAND NAME(S): Premarin What should I tell my health care provider before I take this medicine? They need to know if you have any of these conditions: -abnormal vaginal bleeding -blood vessel disease or blood clots -breast, cervical, endometrial, or uterine cancer -dementia -diabetes -gallbladder disease -heart disease or recent heart attack -high blood pressure -high cholesterol -high level of calcium in the blood -hysterectomy -kidney disease -liver disease -migraine headaches -protein C deficiency -protein S deficiency -stroke -systemic lupus erythematosus (SLE) -tobacco smoker -an unusual or allergic reaction to estrogens other medicines, foods, dyes, or preservatives -pregnant or trying to get pregnant -breast-feeding How should I use this medicine? This medicine is for use in the vagina only. Do not take by mouth. Follow the directions on the prescription label. Use at bedtime unless otherwise directed by your doctor or health care professional. Use the special applicator supplied with the cream. Wash hands before and after use. Fill the applicator with the cream and remove from the tube. Lie on your back, part and bend your knees. Insert the applicator into the vagina and push the plunger to expel the cream into the vagina. Wash the applicator with warm  soapy water and rinse well. Use exactly as directed for the complete length of time prescribed. Do not stop using except on the advice of your doctor or health care professional. Talk to your pediatrician regarding the use of this medicine in children. Special care may be needed. A patient package insert for the product will be given with each prescription and refill. Read this sheet carefully each time. The sheet may change frequently. Overdosage: If you think you have taken too much of this medicine contact a poison control center or emergency room at once. NOTE: This medicine is only for you. Do not share this medicine with others. What if I miss a dose? If you miss a dose, use it as soon as you can. If it is almost time for your next dose, use only that dose. Do not use double or extra doses. What may interact with this medicine? Do not take this medicine with any of the following medications: -aromatase inhibitors like aminoglutethimide, anastrozole, exemestane, letrozole, testolactone This medicine may also interact with the following medications: -barbiturates used for inducing sleep or treating seizures -carbamazepine -grapefruit juice -medicines for fungal infections like itraconazole and ketoconazole -raloxifene or tamoxifen -rifabutin -rifampin -rifapentine -ritonavir -some antibiotics used to treat infections -St. John's Wort -warfarin This list may not describe all possible interactions. Give your health care provider a list of all the medicines, herbs, non-prescription drugs, or dietary supplements you use. Also tell them if you smoke, drink alcohol, or use illegal drugs. Some items may interact with your medicine. What should I watch for while using this medicine? Visit your health care professional for regular checks on your progress. You will need a regular breast and pelvic  exam. You should also discuss the need for regular mammograms with your health care professional, and  follow his or her guidelines. This medicine can make your body retain fluid, making your fingers, hands, or ankles swell. Your blood pressure can go up. Contact your doctor or health care professional if you feel you are retaining fluid. If you have any reason to think you are pregnant; stop taking this medicine at once and contact your doctor or health care professional. Tobacco smoking increases the risk of getting a blood clot or having a stroke, especially if you are more than 50 years old. You are strongly advised not to smoke. If you wear contact lenses and notice visual changes, or if the lenses begin to feel uncomfortable, consult your eye care specialist. If you are going to have elective surgery, you may need to stop taking this medicine beforehand. Consult your health care professional for advice prior to scheduling the surgery. What side effects may I notice from receiving this medicine? Side effects that you should report to your doctor or health care professional as soon as possible: -allergic reactions like skin rash, itching or hives, swelling of the face, lips, or tongue -breast tissue changes or discharge -changes in vision -chest pain -confusion, trouble speaking or understanding -dark urine -general ill feeling or flu-like symptoms -light-colored stools -nausea, vomiting -pain, swelling, warmth in the leg -right upper belly pain -severe headaches -shortness of breath -sudden numbness or weakness of the face, arm or leg -trouble walking, dizziness, loss of balance or coordination -unusual vaginal bleeding -yellowing of the eyes or skin Side effects that usually do not require medical attention (report to your doctor or health care professional if they continue or are bothersome): -hair loss -increased hunger or thirst -increased urination -symptoms of vaginal infection like itching, irritation or unusual discharge -unusually weak or tired This list may not describe  all possible side effects. Call your doctor for medical advice about side effects. You may report side effects to FDA at 1-800-FDA-1088. Where should I keep my medicine? Keep out of the reach of children. Store at room temperature between 15 and 30 degrees C (59 and 86 degrees F). Throw away any unused medicine after the expiration date. NOTE: This sheet is a summary. It may not cover all possible information. If you have questions about this medicine, talk to your doctor, pharmacist, or health care provider.  2015, Elsevier/Gold Standard. (2010-11-30 09:20:36)  Fluconazole tablets What is this medicine? FLUCONAZOLE (floo KON na zole) is an antifungal medicine. It is used to treat certain kinds of fungal or yeast infections. This medicine may be used for other purposes; ask your health care provider or pharmacist if you have questions. COMMON BRAND NAME(S): Diflucan What should I tell my health care provider before I take this medicine? They need to know if you have any of these conditions: -electrolyte abnormalities -history of irregular heart beat -kidney disease -an unusual or allergic reaction to fluconazole, other azole antifungals, medicines, foods, dyes, or preservatives -pregnant or trying to get pregnant -breast-feeding How should I use this medicine? Take this medicine by mouth. Follow the directions on the prescription label. Do not take your medicine more often than directed. Talk to your pediatrician regarding the use of this medicine in children. Special care may be needed. This medicine has been used in children as young as 656 months of age. Overdosage: If you think you have taken too much of this medicine contact a poison control center or  emergency room at once. NOTE: This medicine is only for you. Do not share this medicine with others. What if I miss a dose? If you miss a dose, take it as soon as you can. If it is almost time for your next dose, take only that dose. Do  not take double or extra doses. What may interact with this medicine? Do not take this medicine with any of the following medications: -astemizole -certain medicines for irregular heart beat like dofetilide, dronedarone, quinidine -cisapride -erythromycin -lomitapide -other medicines that prolong the QT interval (cause an abnormal heart rhythm) -pimozide -terfenadine -thioridazine -tolvaptan -ziprasidone This medicine may also interact with the following medications: -antiviral medicines for HIV or AIDS -birth control pills -certain antibiotics like rifabutin, rifampin -certain medicines for blood pressure like amlodipine, isradipine, felodipine, hydrochlorothiazide, losartan, nifedipine -certain medicines for cancer like cyclophosphamide, vinblastine, vincristine -certain medicines for cholesterol like atorvastatin, lovastatin, fluvastatin, simvastatin -certain medicines for depression, anxiety, or psychotic disturbances like amitriptyline, midazolam, nortriptyline, triazolam -certain medicines for diabetes like glipizide, glyburide, tolbutamide -certain medicines for pain like alfentanil, fentanyl, methadone -certain medicines for seizures like carbamazepine, phenytoin -certain medicines that treat or prevent blood clots like warfarin -halofantrine -medicines that lower your chance of fighting infection like cyclosporine, prednisone, tacrolimus -NSAIDS, medicines for pain and inflammation, like celecoxib, diclofenac, flurbiprofen, ibuprofen, meloxicam, naproxen -other medicines for fungal infections -sirolimus -theophylline -tofacitinib This list may not describe all possible interactions. Give your health care provider a list of all the medicines, herbs, non-prescription drugs, or dietary supplements you use. Also tell them if you smoke, drink alcohol, or use illegal drugs. Some items may interact with your medicine. What should I watch for while using this medicine? Visit your  doctor or health care professional for regular checkups. If you are taking this medicine for a long time you may need blood work. Tell your doctor if your symptoms do not improve. Some fungal infections need many weeks or months of treatment to cure. Alcohol can increase possible damage to your liver. Avoid alcoholic drinks. If you have a vaginal infection, do not have sex until you have finished your treatment. You can wear a sanitary napkin. Do not use tampons. Wear freshly washed cotton, not synthetic, panties. What side effects may I notice from receiving this medicine? Side effects that you should report to your doctor or health care professional as soon as possible: -allergic reactions like skin rash or itching, hives, swelling of the lips, mouth, tongue, or throat -dark urine -feeling dizzy or faint -irregular heartbeat or chest pain -redness, blistering, peeling or loosening of the skin, including inside the mouth -trouble breathing -unusual bruising or bleeding -vomiting -yellowing of the eyes or skin Side effects that usually do not require medical attention (report to your doctor or health care professional if they continue or are bothersome): -changes in how food tastes -diarrhea -headache -stomach upset or nausea This list may not describe all possible side effects. Call your doctor for medical advice about side effects. You may report side effects to FDA at 1-800-FDA-1088. Where should I keep my medicine? Keep out of the reach of children. Store at room temperature below 30 degrees C (86 degrees F). Throw away any medicine after the expiration date. NOTE: This sheet is a summary. It may not cover all possible information. If you have questions about this medicine, talk to your doctor, pharmacist, or health care provider.  2015, Elsevier/Gold Standard. (2013-04-05 16:13:04)

## 2014-07-15 NOTE — Assessment & Plan Note (Signed)
Discontinued senna due to patient complaints of cramps. Started miralax per patient request (start daily and could increase to bid if needed)--available at health department

## 2014-07-15 NOTE — Assessment & Plan Note (Addendum)
Refilled premarin cream today, recommended change dosing to biweekly for now to reduce estrogen load, and have pcp reassess. Hot flashes improving per patient.  She did not tolerate Effexor well and stopped taking it that was prescribed on last visit

## 2014-07-15 NOTE — Progress Notes (Signed)
Subjective:   Patient ID: Briana Parker female   DOB: 07/10/1966 48 y.o.   MRN: [id number]  HPI: Briana Parker is a 48 y.o. female with PMH as listed below presenting to opc today for acute visit requesting refill of premarin cream.   Vaginal atrophy--bothersome vaginal dryness for at least one year. Reports symptoms of hot flashes and dryness started after hysterectomy in 2006 but noticeable now. Hot flashes have improved but still present. Vaginal dryness is noticed mostly when having sex, occasional itching and discomfort. No bleeding while having sex. Does think she has another yeast infection at this time that started after recent course of antibiotics for a sinus infection. She says she only notices it because she sees white discharge when wiping. Premarin cream helps, she uses it daily and then gives herself a break for at least one week. She does also occasionally use lubricants to help with dryness as well.   Constipation--would like powder from health department instead of the senna tablets that give her abdominal cramping. Says she tried the powder from a friend and it worked better. We called health department and the laxative powder they have is miralax and she wishes to try that.  Past Medical History  Diagnosis Date  . Fibromyalgia   . Hyperlipidemia   . Low back pain   . Allergic rhinitis   . Allergy     seasonal  . Arthritis     back  . Chronic bronchitis   . GERD (gastroesophageal reflux disease)    Current Outpatient Prescriptions  Medication Sig Dispense Refill  . albuterol (PROVENTIL HFA;VENTOLIN HFA) 108 (90 BASE) MCG/ACT inhaler Inhale 2 puffs into the lungs 4 (four) times daily as needed for wheezing or shortness of breath. 3 Inhaler 1  . albuterol (PROVENTIL) (2.5 MG/3ML) 0.083% nebulizer solution Take 3 mLs (2.5 mg total) by nebulization every 6 (six) hours as needed for wheezing or shortness of breath. 75 mL 12  . carboxymethylcellulose (LUBRICANT EYE  DROPS) 0.5 % SOLN Place 2 drops into both eyes 3 (three) times daily as needed. 15 mL 0  . conjugated estrogens (PREMARIN) vaginal cream Use 1 applicationful every night for 21 nights,then stop for 7 night. 42.5 g 0  . cyclobenzaprine (FLEXERIL) 10 MG tablet Take 1 tablet (10 mg total) by mouth 3 (three) times daily as needed. 90 tablet 0  . guaiFENesin-codeine 100-10 MG/5ML syrup Take 10 mLs by mouth every 4 (four) hours as needed for cough. 120 mL 0  . mometasone (NASONEX) 50 MCG/ACT nasal spray Place 2 sprays into the nose daily. 17 g 1  . naproxen (NAPROSYN) 500 MG tablet Take 1 tablet (500 mg total) by mouth 2 (two) times daily with a meal. 60 tablet 2  . nortriptyline (PAMELOR) 25 MG capsule Take 1 capsule (25 mg total) by mouth at bedtime. 30 capsule 5  . omeprazole (PRILOSEC) 40 MG capsule Take 1 capsule (40 mg total) by mouth daily. 90 capsule 3  . rosuvastatin (CRESTOR) 10 MG tablet Take 1 tablet (10 mg total) by mouth daily. 90 tablet 0  . senna-docusate (SENOKOT-S) 8.6-50 MG per tablet Take 1 tablet by mouth daily. Take one tablet every 12 hours as needed for constipation     . sulfamethoxazole-trimethoprim (BACTRIM DS) 800-160 MG per tablet Take 1 tablet by mouth 2 (two) times daily. 20 tablet 0   No current facility-administered medications for this visit.   Family History  Problem Relation Age of Onset  .  Coronary artery disease      Strong family history in first degree relatives  . Diabetes Mother     died recently in 01/2010 due to 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  She was on dialysis and died with cardiac arrest as per pt.  . Heart disease Father   . Hypertension Father   . Colon cancer Neg Hx    History   Social History  . Marital Status: Married    Spouse Name: N/A    Number of Children: N/A  . Years of Education: N/A   Social History Main Topics  . Smoking status: Never Smoker   . Smokeless tobacco: Never Used  . Alcohol Use: No  . Drug Use: No  . Sexual Activity: None     Other Topics Concern  . None   Social History Narrative   Exercises Regularly, currently takes care of one child.  Lives at home with daughter.   Review of Systems:  Constitutional:  Denies fever, chills. +hot flashes   HEENT:  Denies congestion  Respiratory:  Denies SOB  Cardiovascular:  Denies chest pain   Gastrointestinal:  Denies nausea, vomiting, abdominal pain. +acid reflux  Genitourinary:  Denies dysuria. Cottage cheese like discharge, occasional pruritis, vaginal dryness and discomfort at times during intercourse. Denies hematuria or vaginal bleeding.   Musculoskeletal:  Chronic back pain  Skin:  Denies pallor, rash and wound.   Neurological:  Denies syncope.   Objective:  Physical Exam: Filed Vitals:   07/15/14 0925  BP: 123/69  Pulse: 99  Temp: 98.5 F (36.9 C)  TempSrc: Oral  Height: 5\' 2"  (1.575 m)  Weight: 141 lb 1.6 oz (64.003 kg)  SpO2: 100%   Vitals reviewed. General: sitting in chair, NAD HEENT: EOMI Cardiac: RRR Pulm: clear to auscultation bilaterally, no wheezes, rales, or rhonchi Abd: soft, nontender, nondistended, BS present Ext: moving all 4 extremities GYN: did not tolerate exam well but appears to have cervix? Despite hysterectomy and surrounding tissues with several white scrapeable plaques in area. No bleeding.  Neuro: alert and oriented X3  Assessment & Plan:  Discussed with Dr. Dalphine HandingBhardwaj papsmear and wet prep done today Changed senna to miralax Back pain discussion with pcp next visit Premarin

## 2014-07-15 NOTE — Telephone Encounter (Signed)
[place] [facility] calls and states that [facility] only gives 30 gram tubes for free, is this ok?

## 2014-07-15 NOTE — Assessment & Plan Note (Addendum)
papsmear done today--limited exam and visibility due to patient discomfort and unable to spread legs very wide. Will see if appropriate sample collected, appears to have ?cervix still intact.  -will try to request records of hysterectomy for pcp

## 2014-07-16 ENCOUNTER — Other Ambulatory Visit: Payer: Self-pay | Admitting: *Deleted

## 2014-07-16 DIAGNOSIS — N951 Menopausal and female climacteric states: Secondary | ICD-10-CM

## 2014-07-16 NOTE — Telephone Encounter (Signed)
Tell her to try the miralax first, if she likes it Dr. Mikey BussingHoffman can give more refills. I will defer extra refills of premarin cream to Dr. Mikey BussingHoffman

## 2014-07-16 NOTE — Telephone Encounter (Signed)
Pt informed to try Miralax first before any possible additional refills ordered by Dr Mikey BussingHoffman - pt voiced understanding. Also sent refill for Premarin cream to Dr Mikey BussingHoffman.

## 2014-07-16 NOTE — Telephone Encounter (Signed)
Pt requesting at least 6 month supply; pt saw Dr Virgina OrganQureshi on 11/4 - did not receive any refills. Thanks

## 2014-07-16 NOTE — Telephone Encounter (Signed)
Pt is requesting more refills; foe at least 6 months  Thanks

## 2014-07-17 NOTE — Telephone Encounter (Signed)
Patient is to follow up with me within the month.  It should be fine for her to get this one month prescription and I will decide about extra refills when I see her.

## 2014-07-20 LAB — CYTOLOGY - PAP

## 2014-08-10 ENCOUNTER — Telehealth: Payer: Self-pay | Admitting: *Deleted

## 2014-08-10 NOTE — Telephone Encounter (Signed)
Pt called this AM for appt  Only in AM - for over a week having SOB and discomfort in chest. No appt in clinic this AM - suggest for pt to go to ER - pt prefers not to go to ER due to money. Has an outstanding acct according to pt. Appt given for 08/12/15 8:45AM Dr Sherrine MaplesGlenn. Stanton KidneyDebra Sole Lengacher RN 08/10/14 8:30AM

## 2014-08-11 ENCOUNTER — Ambulatory Visit (HOSPITAL_COMMUNITY)
Admission: RE | Admit: 2014-08-11 | Discharge: 2014-08-11 | Disposition: A | Discharge status: Home / Self Care | Payer: Self-pay | Source: Ambulatory Visit | Attending: Internal Medicine | Admitting: Internal Medicine

## 2014-08-11 ENCOUNTER — Ambulatory Visit (INDEPENDENT_AMBULATORY_CARE_PROVIDER_SITE_OTHER): Payer: Self-pay | Admitting: Internal Medicine

## 2014-08-11 ENCOUNTER — Encounter: Payer: Self-pay | Admitting: Internal Medicine

## 2014-08-11 VITALS — BP 122/83 | HR 92 | Temp 98.2°F | Wt 142.5 lb

## 2014-08-11 DIAGNOSIS — R0789 Other chest pain: Secondary | ICD-10-CM | POA: Insufficient documentation

## 2014-08-11 DIAGNOSIS — R079 Chest pain, unspecified: Secondary | ICD-10-CM | POA: Insufficient documentation

## 2014-08-11 NOTE — Assessment & Plan Note (Addendum)
Pt with chest pain x1 week that changes with movement and is reproducible on exam. EKG with normal sinus rhythm. Lungs clear on exam; no respiratory distress. The onset of her pain coincides with her voluntary cessation of her muscle relaxant. Suspect her pain is musculoskeletal.  - Recommended that she restart her muscle relaxant prescribed by her PCP.  - She was advised that if the pain worsens or changes, or if she develops respiratory distress, to call the clinic or go to the ED

## 2014-08-11 NOTE — Patient Instructions (Signed)
**  Your pain is likely muscle related after stopping your muscle relaxant.  **You can restart your muscle relaxant.   **If the pain worsens or changes, or if you develop increasing shortness of breath, please call the clinic or go to the Emergency Department  General Instructions:   Please bring your medicines with you each time you come to clinic.  Medicines may include prescription medications, over-the-counter medications, herbal remedies, eye drops, vitamins, or other pills.   Progress Toward Treatment Goals:  No flowsheet data found.  Self Care Goals & Plans:  No flowsheet data found.  No flowsheet data found.   Care Management & Community Referrals:  No flowsheet data found.

## 2014-08-11 NOTE — Progress Notes (Signed)
Patient ID: Briana Parker, female   DOB: 03/29/1966, 48 y.o.   MRN: [id number]  Subjective:   Patient ID: Briana Parker female   DOB: 04/30/1966 48 y.o.   MRN: [id number]  HPI: Ms.Lanore T Parker is a 48 y.o. F w/ PMH HLD and asthma presents with chest pain.  She states that she has had chest pain x1 week. She denies any coughing or respiratory distress. The pain in her chest is mid sternal to left chest, sharp, constant, and is worse with movement and is reproducible. She states that she took some pain medication without relief.   Of note, she states that she stopped her muscle relaxant approx 1-2 weeks ago and the pain has been on going since then. She stopped the medication to cut back on the number of medications that she is taking.   She denies fevers, chills, SOB, N/V/D, constipation, changes in urination, or peripheral edema.   Past Medical History  Diagnosis Date  . Fibromyalgia   . Hyperlipidemia   . Low back pain   . Allergic rhinitis   . Allergy     seasonal  . Arthritis     back  . Chronic bronchitis   . GERD (gastroesophageal reflux disease)    Current Outpatient Prescriptions  Medication Sig Dispense Refill  . albuterol (PROVENTIL HFA;VENTOLIN HFA) 108 (90 BASE) MCG/ACT inhaler Inhale 2 puffs into the lungs 4 (four) times daily as needed for wheezing or shortness of breath. 3 Inhaler 1  . albuterol (PROVENTIL) (2.5 MG/3ML) 0.083% nebulizer solution Take 3 mLs (2.5 mg total) by nebulization every 6 (six) hours as needed for wheezing or shortness of breath. 75 mL 12  . calcium-vitamin D (OSCAL WITH D) 500-200 MG-UNIT per tablet Take 1 tablet by mouth daily with breakfast.    . conjugated estrogens (PREMARIN) vaginal cream Place vaginally 2 (two) times a week. 0.5 gram of cream intravaginally administered twice weekly. 42.5 g 0  . mometasone (NASONEX) 50 MCG/ACT nasal spray Place 2 sprays into the nose daily. 17 g 1  . Multiple Vitamins-Minerals (MULTIVITAMIN ADULTS  50+ PO) Take by mouth.    . naproxen (NAPROSYN) 500 MG tablet Take 1 tablet (500 mg total) by mouth 2 (two) times daily with a meal. 60 tablet 2  . nortriptyline (PAMELOR) 25 MG capsule Take 1 capsule (25 mg total) by mouth at bedtime. 30 capsule 5  . rosuvastatin (CRESTOR) 10 MG tablet Take 1 tablet (10 mg total) by mouth daily. 90 tablet 0  . cyclobenzaprine (FLEXERIL) 10 MG tablet Take 1 tablet (10 mg total) by mouth 3 (three) times daily as needed. (Patient not taking: Reported on 08/11/2014) 90 tablet 0  . omeprazole (PRILOSEC) 40 MG capsule Take 1 capsule (40 mg total) by mouth daily. (Patient not taking: Reported on 08/11/2014) 90 capsule 3  . polyethylene glycol (MIRALAX / GLYCOLAX) packet Take 17 g by mouth daily. (Patient not taking: Reported on 08/11/2014) 14 each 0   No current facility-administered medications for this visit.   Family History  Problem Relation Age of Onset  . Coronary artery disease      Strong family history in first degree relatives  . Diabetes Mother     died recently in 01/2010 due to 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  She was on dialysis and died with cardiac arrest as per pt.  . Heart disease Father   . Hypertension Father   . Colon cancer Neg Hx    History   Social  History  . Marital Status: Married    Spouse Name: N/A    Number of Children: N/A  . Years of Education: N/A   Social History Main Topics  . Smoking status: Never Smoker   . Smokeless tobacco: Never Used  . Alcohol Use: No  . Drug Use: No  . Sexual Activity: None   Other Topics Concern  . None   Social History Narrative   Exercises Regularly, currently takes care of one child.  Lives at home with daughter.   Review of Systems: Constitutional: Denies fever, chills, appetite change HEENT: Denies congestion, rhinorrhea Respiratory: Denies SOB, DOE, cough, and wheezing.   Cardiovascular: +chest pain Gastrointestinal: Denies nausea, vomiting, diarrhea, constipation.  Genitourinary: Denies  dysuria Musculoskeletal: +pain in shoulders Skin: Denies rash and wound.  Neurological: Denies syncope, weakness Psychiatric/Behavioral: Denies suicidal ideation, mood changes  Objective:  Physical Exam: Filed Vitals:   08/11/14 0840  BP: 122/83  Pulse: 92  Temp: 98.2 F (36.8 C)  TempSrc: Oral  Weight: 142 lb 8 oz (64.638 kg)  SpO2: 100%   Constitutional: Vital signs reviewed.  Patient is a well-developed and well-nourished female in no acute distress and cooperative with exam. Alert and oriented x3.  Head: Normocephalic and atraumatic Eyes: PERRL, EOMI. No scleral icterus.  Cardiovascular: RRR, no MRG Pulmonary/Chest: Normal respiratory effort, able to speak in complete sentences, CTAB, no wheezes, rales, or rhonchi Abdominal: Soft. Non-tender, non-distended, bowel sounds are normal, no masses, organomegaly, or guarding present.  Musculoskeletal: No joint deformities, erythema, or stiffness,  Neurological: A&O x3, cranial nerve II-XII are grossly intact, no focal motor deficit  Skin: Warm, dry and intact. Mid and left chest tender to palpation, no edema or ecchymoses   Psychiatric: Normal mood and affect. Speech and behavior is normal.   Assessment & Plan:   Please refer to Problem List based Assessment and Plan

## 2014-08-11 NOTE — Progress Notes (Signed)
Internal Medicine Clinic Attending  Case discussed with Dr. Glenn soon after the resident saw the patient.  We reviewed the resident's history and exam and pertinent patient test results.  I agree with the assessment, diagnosis, and plan of care documented in the resident's note. 

## 2014-08-28 ENCOUNTER — Other Ambulatory Visit: Payer: Self-pay | Admitting: *Deleted

## 2014-08-31 MED ORDER — MOMETASONE FUROATE 50 MCG/ACT NA SUSP: 2.0000 | Freq: Every day | NASAL | Status: DC

## 2014-09-14 ENCOUNTER — Other Ambulatory Visit: Payer: Self-pay | Admitting: *Deleted

## 2014-09-14 MED ORDER — POLYETHYLENE GLYCOL 3350 17 G PO PACK: 17.0000 g | PACK | Freq: Every day | ORAL | Status: DC

## 2014-09-14 NOTE — Telephone Encounter (Signed)
Fax from [facility] - last dispensed 07/15/14 .

## 2014-09-21 ENCOUNTER — Ambulatory Visit: Payer: Self-pay

## 2014-09-23 ENCOUNTER — Ambulatory Visit: Payer: Self-pay

## 2014-09-25 ENCOUNTER — Ambulatory Visit: Payer: Self-pay

## 2014-11-04 ENCOUNTER — Other Ambulatory Visit: Payer: Self-pay | Admitting: *Deleted

## 2014-11-04 NOTE — Telephone Encounter (Signed)
[facility]ax [facility]  Last dispensed 06/29/2014.Kingsley SpittleGoldston, Darlene Cassady2/24/20164:53 PM

## 2014-11-05 MED ORDER — ALBUTEROL SULFATE HFA 108 (90 BASE) MCG/ACT IN AERS: 2.0000 | INHALATION_SPRAY | Freq: Four times a day (QID) | RESPIRATORY_TRACT | Status: DC | PRN

## 2014-11-10 ENCOUNTER — Other Ambulatory Visit: Payer: Self-pay | Admitting: *Deleted

## 2014-11-10 DIAGNOSIS — E785 Hyperlipidemia, unspecified: Secondary | ICD-10-CM

## 2014-11-10 MED ORDER — ROSUVASTATIN CALCIUM 10 MG PO TABS: 10.0000 mg | ORAL_TABLET | Freq: Every day | ORAL | Status: DC

## 2014-11-16 ENCOUNTER — Telehealth: Payer: Self-pay | Admitting: *Deleted

## 2014-11-16 NOTE — Telephone Encounter (Signed)
Pt called stating she was taken off Nexium and started on Omeprazole 20 mg.  She is still having heartburn and wants to increase the dose.   I looked up last Rx and see the order reads Omeprazole 40 mg daily, written on 07/06/14.  I called in that Rx as [facility] has not received. Is this okay with you?

## 2014-11-18 NOTE — Telephone Encounter (Signed)
That sounds great.

## 2014-12-30 ENCOUNTER — Encounter: Payer: Self-pay | Admitting: Internal Medicine

## 2015-02-11 ENCOUNTER — Encounter: Payer: Self-pay | Admitting: Internal Medicine

## 2015-02-11 ENCOUNTER — Ambulatory Visit (INDEPENDENT_AMBULATORY_CARE_PROVIDER_SITE_OTHER): Payer: Self-pay | Admitting: Internal Medicine

## 2015-02-11 VITALS — BP 122/88 | HR 90 | Temp 98.4°F | Wt 149.1 lb

## 2015-02-11 DIAGNOSIS — G5603 Carpal tunnel syndrome, bilateral upper limbs: Secondary | ICD-10-CM

## 2015-02-11 DIAGNOSIS — G5601 Carpal tunnel syndrome, right upper limb: Secondary | ICD-10-CM

## 2015-02-11 DIAGNOSIS — G8929 Other chronic pain: Secondary | ICD-10-CM

## 2015-02-11 DIAGNOSIS — G5602 Carpal tunnel syndrome, left upper limb: Secondary | ICD-10-CM

## 2015-02-11 DIAGNOSIS — R5382 Chronic fatigue, unspecified: Secondary | ICD-10-CM

## 2015-02-11 DIAGNOSIS — E785 Hyperlipidemia, unspecified: Secondary | ICD-10-CM

## 2015-02-11 DIAGNOSIS — M545 Low back pain: Secondary | ICD-10-CM

## 2015-02-11 DIAGNOSIS — R5383 Other fatigue: Secondary | ICD-10-CM | POA: Insufficient documentation

## 2015-02-11 LAB — CBC
HCT: 40.5 % (ref 36.0–46.0)
HEMOGLOBIN: 13.8 g/dL (ref 12.0–15.0)
MCH: 28.7 pg (ref 26.0–34.0)
MCHC: 34.1 g/dL (ref 30.0–36.0)
MCV: 84.2 fL (ref 78.0–100.0)
MPV: 10.1 fL (ref 8.6–12.4)
Platelets: 269 10*3/uL (ref 150–400)
RBC: 4.81 MIL/uL (ref 3.87–5.11)
RDW: 13.1 % (ref 11.5–15.5)
WBC: 5.1 10*3/uL (ref 4.0–10.5)

## 2015-02-11 LAB — LIPID PANEL
Cholesterol: 157 mg/dL (ref 0–200)
HDL: 52 mg/dL (ref >=46)
LDL Cholesterol: 82 mg/dL (ref 0–99)
Total CHOL/HDL Ratio: 3 Ratio
Triglycerides: 115 mg/dL (ref <150)
VLDL: 23 mg/dL (ref 0–40)

## 2015-02-11 LAB — BASIC METABOLIC PANEL WITH GFR
BUN: 19 mg/dL (ref 6–23)
CO2: 25 mEq/L (ref 19–32)
CREATININE: 0.77 mg/dL (ref 0.50–1.10)
Calcium: 9 mg/dL (ref 8.4–10.5)
Chloride: 106 mEq/L (ref 96–112)
GFR, Est Non African American: >89 mL/min
Glucose, Bld: 84 mg/dL (ref 70–99)
POTASSIUM: 4.2 meq/L (ref 3.5–5.3)
Sodium: 141 mEq/L (ref 135–145)

## 2015-02-11 MED ORDER — NAPROXEN 500 MG PO TABS: 500.0000 mg | ORAL_TABLET | Freq: Two times a day (BID) | ORAL | Status: DC

## 2015-02-11 MED ORDER — CYCLOBENZAPRINE HCL 10 MG PO TABS: 10.0000 mg | ORAL_TABLET | Freq: Three times a day (TID) | ORAL | Status: DC | PRN

## 2015-02-11 MED ORDER — MOMETASONE FUROATE 50 MCG/ACT NA SUSP: 2.0000 | Freq: Every day | NASAL | Status: DC

## 2015-02-11 MED ORDER — OMEPRAZOLE 40 MG PO CPDR: 40.0000 mg | DELAYED_RELEASE_CAPSULE | Freq: Every day | ORAL | Status: DC

## 2015-02-11 MED ORDER — NORTRIPTYLINE HCL 25 MG PO CAPS: 25.0000 mg | ORAL_CAPSULE | Freq: Every day | ORAL | Status: DC

## 2015-02-11 MED ORDER — ROSUVASTATIN CALCIUM 10 MG PO TABS: 10.0000 mg | ORAL_TABLET | Freq: Every day | ORAL | Status: DC

## 2015-02-11 NOTE — Patient Instructions (Signed)
General Instructions:   Please bring your medicines with you each time you come to clinic.  Medicines may include prescription medications, over-the-counter medications, herbal remedies, eye drops, vitamins, or other pills.   Progress Toward Treatment Goals:  No flowsheet data found.  Self Care Goals & Plans:  No flowsheet data found.  No flowsheet data found.   Care Management & Community Referrals:  No flowsheet data found.      

## 2015-02-11 NOTE — Progress Notes (Signed)
Medicine attending: Medical history, presenting problems, physical findings, and medications, reviewed with Dr Erik Hoffman and I concur with his evaluation and management plan. 

## 2015-02-11 NOTE — Progress Notes (Signed)
[facility] Subjective:   Patient ID: Briana Parker female   DOB: 03/01/1966 49 y.o.   MRN: [id number]  HPI: Briana Parker is a 49 y.o. female with a PMH detailed below who presents for routine follow up.  Today she has multiple complaints.  She complaints of some bilateral finger numbness, typically at night when she goes to bed.  She describes it as pins and needles.  Of note she has previously been diagnosed with carpel tunnel and previously used wrist braces at night which helped. She has not used this recently   She reports her sister is anemic, and she feels a little more fatigued lately, she wants to be checked for anemia.  She reports dry eyes, she notes that her eye doctor has recommended artificial tears but she has not been using them, wants a recommendation.  Back pain: still having back pain, Naproxen and flexeril help.  No new symptoms.    Past Medical History  Diagnosis Date  . Fibromyalgia   . Hyperlipidemia   . Low back pain   . Allergic rhinitis   . Allergy     seasonal  . Arthritis     back  . Chronic bronchitis   . GERD (gastroesophageal reflux disease)    Current Outpatient Prescriptions  Medication Sig Dispense Refill  . albuterol (PROVENTIL HFA;VENTOLIN HFA) 108 (90 BASE) MCG/ACT inhaler Inhale 2 puffs into the lungs 4 (four) times daily as needed for wheezing or shortness of breath. 3 Inhaler 3  . albuterol (PROVENTIL) (2.5 MG/3ML) 0.083% nebulizer solution Take 3 mLs (2.5 mg total) by nebulization every 6 (six) hours as needed for wheezing or shortness of breath. 75 mL 12  . calcium-vitamin D (OSCAL WITH D) 500-200 MG-UNIT per tablet Take 1 tablet by mouth daily with breakfast.    . conjugated estrogens (PREMARIN) vaginal cream Place vaginally 2 (two) times a week. 0.5 gram of cream intravaginally administered twice weekly. 42.5 g 0  . cyclobenzaprine (FLEXERIL) 10 MG tablet Take 1 tablet (10 mg total) by mouth 3 (three)  times daily as needed. 90 tablet 2  . mometasone (NASONEX) 50 MCG/ACT nasal spray Place 2 sprays into the nose daily. 17 g 6  . Multiple Vitamins-Minerals (MULTIVITAMIN ADULTS 50+ PO) Take by mouth.    . naproxen (NAPROSYN) 500 MG tablet Take 1 tablet (500 mg total) by mouth 2 (two) times daily with a meal. 60 tablet 5  . nortriptyline (PAMELOR) 25 MG capsule Take 1 capsule (25 mg total) by mouth at bedtime. 30 capsule 5  . omeprazole (PRILOSEC) 40 MG capsule Take 1 capsule (40 mg total) by mouth daily. 90 capsule 3  . polyethylene glycol (MIRALAX / GLYCOLAX) packet Take 17 g by mouth daily. 30 each 3  . rosuvastatin (CRESTOR) 10 MG tablet Take 1 tablet (10 mg total) by mouth daily. 90 tablet 3   No current facility-administered medications for this visit.   Family History  Problem Relation Age of Onset  . Coronary artery disease      Strong family history in first degree relatives  . Diabetes Mother     died recently in 01/2010 due to 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  She was on dialysis and died with cardiac arrest as per pt.  . Heart disease Father   . Hypertension Father   . Colon cancer Neg Hx    History   Social History  . Marital Status: Married    Spouse Name: N/A  .  Number of Children: N/A  . Years of Education: N/A   Social History Main Topics  . Smoking status: Never Smoker   . Smokeless tobacco: Never Used  . Alcohol Use: No  . Drug Use: No  . Sexual Activity: Not on file   Other Topics Concern  . None   Social History Narrative   Exercises Regularly, currently takes care of one child.  Lives at home with daughter.   Review of Systems: Review of Systems  Constitutional: Positive for malaise/fatigue. Negative for fever and chills.  Eyes: Negative for blurred vision.  Respiratory: Negative for cough.   Cardiovascular: Negative for chest pain.  Gastrointestinal: Negative for heartburn.  Genitourinary: Negative for dysuria and urgency.  Musculoskeletal: Positive for back  pain. Negative for joint pain.  Neurological: Negative for weakness and headaches.  Psychiatric/Behavioral: Negative for depression.     Objective:  Physical Exam: Filed Vitals:   02/11/15 0917  BP: 122/88  Pulse: 90  Temp: 98.4 F (36.9 C)  TempSrc: Oral  Weight: 149 lb 1.6 oz (67.631 kg)  SpO2: 100%   Physical Exam  Constitutional: She is well-developed, well-nourished, and in no distress. No distress.  HENT:  Head: Normocephalic and atraumatic.  Mouth/Throat: Oropharynx is clear and moist.  Eyes: Conjunctivae are normal.  Cardiovascular: Normal rate, regular rhythm, normal heart sounds and intact distal pulses.   No murmur heard. Pulmonary/Chest: Effort normal and breath sounds normal. No respiratory distress. She has no wheezes. She has no rales.  Abdominal: Soft. Bowel sounds are normal. She exhibits no distension. There is no tenderness.  Musculoskeletal: She exhibits tenderness (lumbar paraspinal muscles). She exhibits no edema.  Neurological:  + phalen's test of right hand  Skin: Skin is warm and dry. She is not diaphoretic.  Psychiatric: Affect and judgment normal.  Nursing note and vitals reviewed.    Assessment & Plan:  Case discussed with Dr. Cyndie Chime  Carpal tunnel syndrome, bilateral Per patient and by exam right seems worse than left side.  She reports she has cock-up wrist splints at home which helped in the past. -Recommended to restart using wrist splints at night.   LOW BACK PAIN - Reasonably well controlled with NSAID and muscle relaxer - Continue Naproxen  BID (she was just taking once a day) - Continue Flexeril as needed.   Hyperlipidemia - Recheck Lipid panel - LDL at goal will continue Crestor  daily   Fatigue - No obvious signs or symptoms of bleeding, patient wants to be sure she is not anemic. - Check CBC>>> returned normal.     Medications Ordered Meds ordered this encounter  Medications  . naproxen (NAPROSYN) 500  MG tablet    Sig: Take 1 tablet (500 mg total) by mouth 2 (two) times daily with a meal.    Dispense:  60 tablet    Refill:  5  . nortriptyline (PAMELOR) 25 MG capsule    Sig: Take 1 capsule (25 mg total) by mouth at bedtime.    Dispense:  30 capsule    Refill:  5  . omeprazole (PRILOSEC) 40 MG capsule    Sig: Take 1 capsule (40 mg total) by mouth daily.    Dispense:  90 capsule    Refill:  3  . rosuvastatin (CRESTOR) 10 MG tablet    Sig: Take 1 tablet (10 mg total) by mouth daily.    Dispense:  90 tablet    Refill:  3  . mometasone (NASONEX) 50 MCG/ACT nasal spray  Sig: Place 2 sprays into the nose daily.    Dispense:  17 g    Refill:  6  . cyclobenzaprine (FLEXERIL) 10 MG tablet    Sig: Take 1 tablet (10 mg total) by mouth 3 (three) times daily as needed.    Dispense:  90 tablet    Refill:  2   Other Orders Orders Placed This Encounter  Procedures  . CBC no Diff  . BMP with Estimated GFR ([id number])  . Lipid Profile

## 2015-02-12 DIAGNOSIS — G5603 Carpal tunnel syndrome, bilateral upper limbs: Secondary | ICD-10-CM | POA: Insufficient documentation

## 2015-02-12 NOTE — Assessment & Plan Note (Signed)
-   Recheck Lipid panel - LDL at goal will continue Crestor 10mg  daily

## 2015-02-12 NOTE — Assessment & Plan Note (Signed)
-   No obvious signs or symptoms of bleeding, patient wants to be sure she is not anemic. - Check CBC>>> returned normal.

## 2015-02-12 NOTE — Assessment & Plan Note (Signed)
-   Reasonably well controlled with NSAID and muscle relaxer - Continue Naproxen 500mg  BID (she was just taking once a day) - Continue Flexeril as needed.

## 2015-02-12 NOTE — Assessment & Plan Note (Addendum)
Per patient and by exam right seems worse than left side.  She reports she has cock-up wrist splints at home which helped in the past. -Recommended to restart using wrist splints at night.  ADDENDUM: patient called and reports has been using wrist splints at night for a month and is no better, wants to be referred to Hand surgeon for possible cortisone injections.  She has the orange card and we would likely need to be referred to Sports medicine if they would do this for her.  I will have her first come back for a visit for reevaluation.

## 2015-03-04 ENCOUNTER — Telehealth: Payer: Self-pay | Admitting: *Deleted

## 2015-03-04 NOTE — Telephone Encounter (Signed)
LVM FOR PATIENT / PATIENT IS WANTING REFERRAL FOR "SHOTS" IN HAND. CALLED TO FIND OUT MORE ABOUT WHAT SHE IS REQUESTING.  LM FOR HER TO CALL [facility] @ [phone].WILL SEND FLAG TO DR HOFFMAN.

## 2015-03-09 ENCOUNTER — Telehealth: Payer: Self-pay | Admitting: Internal Medicine

## 2015-03-09 NOTE — Telephone Encounter (Signed)
Call to patient to confirm appointment for 03/10/15 at 10:30 lmtcb

## 2015-03-10 ENCOUNTER — Ambulatory Visit: Payer: Self-pay

## 2015-03-18 ENCOUNTER — Ambulatory Visit: Payer: Self-pay

## 2015-03-29 ENCOUNTER — Encounter: Payer: Self-pay | Admitting: Internal Medicine

## 2015-03-29 ENCOUNTER — Ambulatory Visit (INDEPENDENT_AMBULATORY_CARE_PROVIDER_SITE_OTHER): Payer: Self-pay | Admitting: Internal Medicine

## 2015-03-29 VITALS — BP 118/72 | HR 87 | Temp 97.7°F | Ht 62.0 in | Wt 149.0 lb

## 2015-03-29 DIAGNOSIS — G5602 Carpal tunnel syndrome, left upper limb: Secondary | ICD-10-CM

## 2015-03-29 DIAGNOSIS — G5603 Carpal tunnel syndrome, bilateral upper limbs: Secondary | ICD-10-CM

## 2015-03-29 DIAGNOSIS — M6530 Trigger finger, unspecified finger: Secondary | ICD-10-CM | POA: Insufficient documentation

## 2015-03-29 DIAGNOSIS — M65312 Trigger thumb, left thumb: Secondary | ICD-10-CM

## 2015-03-29 DIAGNOSIS — G5601 Carpal tunnel syndrome, right upper limb: Secondary | ICD-10-CM

## 2015-03-29 NOTE — Progress Notes (Signed)
Subjective:    Patient ID: Briana Parker, female    DOB: Jun 15, 1966, 49 y.o.   MRN: [id number]  HPI Comments: Briana Parker is a 49 year old woman with PMH as below here for follow-up of hand pain.  Please see problem based charting for assessment and plan.     Past Medical History  Diagnosis Date  . Fibromyalgia   . Hyperlipidemia   . Low back pain   . Allergic rhinitis   . Allergy     seasonal  . Arthritis     back  . Chronic bronchitis   . GERD (gastroesophageal reflux disease)    Current Outpatient Prescriptions on File Prior to Visit  Medication Sig Dispense Refill  . albuterol (PROVENTIL HFA;VENTOLIN HFA) 108 (90 BASE) MCG/ACT inhaler Inhale 2 puffs into the lungs 4 (four) times daily as needed for wheezing or shortness of breath. 3 Inhaler 3  . albuterol (PROVENTIL) (2.5 MG/3ML) 0.083% nebulizer solution Take 3 mLs (2.5 mg total) by nebulization every 6 (six) hours as needed for wheezing or shortness of breath. 75 mL 12  . calcium-vitamin D (OSCAL WITH D) 500-200 MG-UNIT per tablet Take 1 tablet by mouth daily with breakfast.    . conjugated estrogens (PREMARIN) vaginal cream Place vaginally 2 (two) times a week. 0.5 gram of cream intravaginally administered twice weekly. 42.5 g 0  . cyclobenzaprine (FLEXERIL) 10 MG tablet Take 1 tablet (10 mg total) by mouth 3 (three) times daily as needed. 90 tablet 2  . mometasone (NASONEX) 50 MCG/ACT nasal spray Place 2 sprays into the nose daily. 17 g 6  . Multiple Vitamins-Minerals (MULTIVITAMIN ADULTS 50+ PO) Take by mouth.    . naproxen (NAPROSYN) 500 MG tablet Take 1 tablet (500 mg total) by mouth 2 (two) times daily with a meal. 60 tablet 5  . omeprazole (PRILOSEC) 40 MG capsule Take 1 capsule (40 mg total) by mouth daily. 90 capsule 3  . polyethylene glycol (MIRALAX / GLYCOLAX) packet Take 17 g by mouth daily. 30 each 3  . rosuvastatin (CRESTOR) 10 MG tablet Take 1 tablet (10 mg total) by mouth daily. 90 tablet 3  .  nortriptyline (PAMELOR) 25 MG capsule Take 1 capsule (25 mg total) by mouth at bedtime. (Patient not taking: Reported on 03/29/2015) 30 capsule 5   No current facility-administered medications on file prior to visit.    Review of Systems  Constitutional: Negative for fever, chills and appetite change.  Musculoskeletal:       Left thumb pain, feels like it is getting stuck x 1 month  Neurological: Positive for numbness. Negative for weakness.       B/L numbness/tingling of hands (R>L), worse at night.       Filed Vitals:   03/29/15 0831  BP: 118/72  Pulse: 87  Temp: 97.7 F (36.5 C)  TempSrc: Oral  Height:  (1.575 m)  Weight: 149 lb (67.586 kg)  SpO2: 100%    Objective:   Physical Exam  Constitutional: She is oriented to person, place, and time. She appears well-developed. No distress.  HENT:  Head: Normocephalic and atraumatic.  Mouth/Throat: Oropharynx is clear and moist. No oropharyngeal exudate.  Eyes: EOM are normal. Pupils are equal, round, and reactive to light.  Neck: Neck supple.  Cardiovascular: Normal rate, regular rhythm and normal heart sounds.  Exam reveals no gallop and no friction rub.   No murmur heard. Pulmonary/Chest: Effort normal and breath sounds normal. No respiratory distress. She has  no wheezes. She has no rales.  Abdominal: Soft. Bowel sounds are normal. She exhibits no distension and no mass. There is no tenderness. There is no rebound and no guarding.  Musculoskeletal: Normal range of motion. She exhibits tenderness. She exhibits no edema.  Small nodule is TTP base of left thumb.  Neurological: She is alert and oriented to person, place, and time. No cranial nerve deficit.  + Tinel's and Phalen's B/L  Skin: Skin is warm. She is not diaphoretic.  Psychiatric: She has a normal mood and affect. Her behavior is normal. Judgment and thought content normal.  Vitals reviewed.         Assessment & Plan:  Please see problem based charting for  assessment and plan.

## 2015-03-29 NOTE — Patient Instructions (Signed)
1. I am referring you to Sports Medicine to see if they can offer you additional treatment for your carpal tunnel syndrome and painful left finger. Please continue to wear your wrist splints at night, take NSAIDs with food as needed for pain/swelling, try not to overuse your hands.   2. Please take all medications as prescribed.    3. If you have worsening of your symptoms or new symptoms arise, please call the clinic ([phone]), or go to the ER immediately if symptoms are severe.  Please call our office if you do not hear from [place] in the next week regarding your Sports Medicine appointment.   Trigger Finger Trigger finger (digital tendinitis and stenosing tenosynovitis) is a common disorder that causes an often painful catching of the fingers or thumb. It occurs as a clicking, snapping, or locking of a finger in the palm of the hand. This is caused by a problem with the tendons that flex or bend the fingers sliding smoothly through their sheaths. The condition may occur in any finger or a couple fingers at the same time.  The finger may lock with the finger curled or suddenly straighten out with a snap. This is more common in patients with rheumatoid arthritis and diabetes. Left untreated, the condition may get worse to the point where the finger becomes locked in flexion, like making a fist, or less commonly locked with the finger straightened out. CAUSES   Inflammation and scarring that lead to swelling around the tendon sheath.  Repeated or forceful movements.  Rheumatoid arthritis, an autoimmune disease that affects joints.  Gout.  Diabetes mellitus. SIGNS AND SYMPTOMS  Soreness and swelling of your finger.  A painful clicking or snapping as you bend and straighten your finger. DIAGNOSIS  Your health care provider will do a physical exam of your finger to diagnose trigger finger. TREATMENT   Splinting for 6-8 weeks may be helpful.  Nonsteroidal anti-inflammatory medicines  (NSAIDs) can help to relieve the pain and inflammation.  Cortisone injections, along with splinting, may speed up recovery. Several injections may be required. Cortisone may give relief after one injection.  Surgery is another treatment that may be used if conservative treatments do not work. Surgery can be minor, without incisions (a cut does not have to be made), and can be done with a needle through the skin.  Other surgical choices involve an open procedure in which the surgeon opens the hand through a small incision and cuts the pulley so the tendon can again slide smoothly. Your hand will still work fine. HOME CARE INSTRUCTIONS  Apply ice to the injured area, twice per day:  Put ice in a plastic bag.  Place a towel between your skin and the bag.  Leave the ice on for 20 minutes, 3-4 times a day.  Rest your hand often. MAKE SURE YOU:   Understand these instructions.  Will watch your condition.  Will get help right away if you are not doing well or get worse. Document Released: 06/17/2004 Document Revised: 04/30/2013 Document Reviewed: 01/28/2013 [facility] Patient Information 2015 [place], [place]. This information is not intended to replace advice given to you by your health care provider. Make sure you discuss any questions you have with your health care provider.  Carpal Tunnel Syndrome The carpal tunnel is a narrow area located on the palm side of your wrist. The tunnel is formed by the wrist bones and ligaments. Nerves, blood vessels, and tendons pass through the carpal tunnel. Repeated wrist motion  or certain diseases may cause swelling within the tunnel. This swelling pinches the main nerve in the wrist (median nerve) and causes the painful hand and arm condition called carpal tunnel syndrome. CAUSES   Repeated wrist motions.  Wrist injuries.  Certain diseases like arthritis, diabetes, alcoholism, hyperthyroidism, and kidney failure.  Obesity.  Pregnancy. SYMPTOMS     A "pins and needles" feeling in your fingers or hand, especially in your thumb, index and middle fingers.  Tingling or numbness in your fingers or hand.  An aching feeling in your entire arm, especially when your wrist and elbow are bent for long periods of time.  Wrist pain that goes up your arm to your shoulder.  Pain that goes down into your palm or fingers.  A weak feeling in your hands. DIAGNOSIS  Your health care provider will take your history and perform a physical exam. An electromyography test may be needed. This test measures electrical signals sent out by your nerves into the muscles. The electrical signals are usually slowed by carpal tunnel syndrome. You may also need X-rays. TREATMENT  Carpal tunnel syndrome may clear up by itself. Your health care provider may recommend a wrist splint or medicine such as a nonsteroidal anti-inflammatory medicine. Cortisone injections may help. Sometimes, surgery may be needed to free the pinched nerve.  HOME CARE INSTRUCTIONS   Take all medicine as directed by your health care provider. Only take over-the-counter or prescription medicines for pain, discomfort, or fever as directed by your health care provider.  If you were given a splint to keep your wrist from bending, wear it as directed. It is important to wear the splint at night. Wear the splint for as long as you have pain or numbness in your hand, arm, or wrist. This may take 1 to 2 months.  Rest your wrist from any activity that may be causing your pain. If your symptoms are work-related, you may need to talk to your employer about changing to a job that does not require using your wrist.  Put ice on your wrist after long periods of wrist activity.  Put ice in a plastic bag.  Place a towel between your skin and the bag.  Leave the ice on for 15-20 minutes, 03-04 times a day.  Keep all follow-up visits as directed by your health care provider. This includes any orthopedic  referrals, physical therapy, and rehabilitation. Any delay in getting necessary care could result in a delay or failure of your condition to heal. SEEK IMMEDIATE MEDICAL CARE IF:   You have new, unexplained symptoms.  Your symptoms get worse and are not helped or controlled with medicines. MAKE SURE YOU:   Understand these instructions.  Will watch your condition.  Will get help right away if you are not doing well or get worse. Document Released: 08/25/2000 Document Revised: 01/12/2014 Document Reviewed: 07/14/2011 [facility] Patient Information 2015 [place]. This information is not intended to replace advice given to you by your health care provider. Make sure you discuss any questions you have with your health care provider.

## 2015-03-29 NOTE — Assessment & Plan Note (Addendum)
Still has right hand numbness (R >L) especially at night and with activity.  Wearing risk splints without relief.   - will refer to Sports Medicine - continue conservative treatment

## 2015-03-29 NOTE — Assessment & Plan Note (Signed)
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 - continue conservative care - splint, NSAIDs, rest, referral to Sports Med (she is going for carpal tunnel but they may also be able to inject for trigger finger pain relief)

## 2015-04-05 NOTE — Progress Notes (Signed)
Internal Medicine Clinic Attending  Case discussed with Dr. Wilson soon after the resident saw the patient.  We reviewed the resident's history and exam and pertinent patient test results.  I agree with the assessment, diagnosis, and plan of care documented in the resident's note.  

## 2015-04-14 ENCOUNTER — Encounter: Payer: Self-pay | Admitting: Family Medicine

## 2015-04-14 ENCOUNTER — Ambulatory Visit (INDEPENDENT_AMBULATORY_CARE_PROVIDER_SITE_OTHER): Payer: Self-pay | Admitting: Family Medicine

## 2015-04-14 VITALS — BP 121/80 | HR 94 | Ht 62.0 in | Wt 150.0 lb

## 2015-04-14 DIAGNOSIS — G5601 Carpal tunnel syndrome, right upper limb: Secondary | ICD-10-CM

## 2015-04-14 DIAGNOSIS — G5603 Carpal tunnel syndrome, bilateral upper limbs: Secondary | ICD-10-CM

## 2015-04-14 DIAGNOSIS — G5600 Carpal tunnel syndrome, unspecified upper limb: Secondary | ICD-10-CM | POA: Insufficient documentation

## 2015-04-14 DIAGNOSIS — M6530 Trigger finger, unspecified finger: Secondary | ICD-10-CM

## 2015-04-14 DIAGNOSIS — G5602 Carpal tunnel syndrome, left upper limb: Secondary | ICD-10-CM

## 2015-04-14 MED ORDER — METHYLPREDNISOLONE ACETATE 40 MG/ML IJ SUSP
40.0000 mg | Freq: Once | INTRAMUSCULAR | Status: AC
  Administered 2015-04-14: 20 mg via INTRA_ARTICULAR

## 2015-04-14 NOTE — Progress Notes (Signed)
  Briana Parker - 49 y.o. female MRN [id number]  Date of birth: 12-20-1965 Briana Parker is a 49 y.o. female who presents today for bilateral hand numbness along with triggering of the left thumb.  Bilateral hand numbness, initial visit-patient has been having ongoing hand numbness for several years now. She has been previously diagnosed with carpal tunnel syndrome without having formal electrodiagnostic testing. The tingling and numbness is present in the first 3 digits on both hands. 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 She does endorse night time awakenings with pain especially in the right hand. She started using a wrist cockup splints in March 2016, and she has not noticed too much of a difference. She denies frank weakness or grip strength weakness in either hand. She has never had the area injected.  She does endorse a positive Flick sign with the right hand being worse than the left.  Left thumb trigger finger, initial visit - patient endorsing triggering of the left thumb which is been ongoing now for 3-4 months. It is worse at the end of the day, and she has noticed that the locking has progressed. There is a palpable nodule and is now painful in the metacarpal region. She has tried taping the area which has helped somewhat. Other than that she has not tried any treatment.  PMHx - Updated and reviewed.  Contributory factors include: Asthma PSHx - Updated and reviewed.  Contributory factors include: Noncontributory FHx - Updated and reviewed.  Contributory factors include:  Noncontributory Medications - Naprosyn 500 mg twice a day   ROS Per HPI   Exam:  Filed Vitals:   04/14/15 1538  BP: 121/80  Pulse: 94   Gen: NAD Cardiorespiratory - Normal respiratory effort/rate.  RRR Wrist: Inspection normal with no visible erythema or swelling. ROM smooth and normal with good flexion and extension and  ulnar/radial deviation that is symmetrical with opposite wrist. Palpation is normal over metacarpals, navicular, lunate, and TFCC; tendons without tenderness/ swelling Strength 5/5 in all directions without pain. Negative Finkelstein Positive flick, tinel's and phalens. Positive tenderness to palpation with palpable nodule at the A1 pulley metacarpal head on the left palmar region of the thumb. There is also palpatory triggering of the digit. Neurovascularly intact bilateral

## 2015-04-14 NOTE — Patient Instructions (Addendum)
[facility] Dr. [place] Bl[facility] office will call the patient with appt time and date for EMG/NCV [address] [place], [place] [address] Phone: [phone]  F/U with [place] in 3 weeks

## 2015-04-14 NOTE — Assessment & Plan Note (Signed)
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 -She will use a Band-Aid splint to the metacarpal joint limiting interphalangeal joint of the left thumb. -Ultrasound-guided injection to the flexor pollicis longus tendon sheath and A1 pulley region with the visual distention underneath the A1 pulley. -She will follow-up in 3-4 weeks to see how she is doing at that time. -Discussed etiology and nature of this including splinting and injection that have a 50% chance of working past 1 year. If this does not work she will need a trigger finger release by a Hydrographic surveyor.   Aspiration/Injection Procedure Note Briana Parker 05/14/1966  Procedure: Injection Indications: trigger  Procedure Details Consent: Risks of procedure as well as the alternatives and risks of each were explained to the (patient/caregiver).  Consent for procedure obtained. verbal and she understands risk of atrophy, pigmentation, or numbness.  Time Out: Verified patient identification, verified procedure, site/side was marked, verified correct patient position, special equipment/implants available, medications/allergies/relevent history reviewed, required imaging and test results available.  Performed.  The area was cleaned with iodine and alcohol swabs.    The left trigger thumb was injected using 0.5 cc's of 40 Depomedrol and 0.5 cc's of 1% lidocaine with a 27 1 1/2" needle.  Ultrasound was used. Images were obtained in Transverse and Long views showing the injection.    A sterile dressing was applied.  Patient did tolerate procedure well. Estimated blood loss: none

## 2015-04-14 NOTE — Assessment & Plan Note (Signed)
Patients signs and symptoms consistent with carpal tunnel bilateral right greater than left. -We will obtain electrodiagnostic testing including possible EMG at [facility]. -We will see her back in 3-4 weeks at which point we will go over her results along with ultrasound of the median nerve to look at the cross section. -At that time we can consider hydro-dissection of the nerve. She should continue to wear her cock up wrist splints at night. -Depending on the results of her EMG, she may need eventual  transverse carpal ligament release which was discussed with her today.

## 2015-04-15 ENCOUNTER — Other Ambulatory Visit: Payer: Self-pay | Admitting: *Deleted

## 2015-04-15 DIAGNOSIS — G5603 Carpal tunnel syndrome, bilateral upper limbs: Secondary | ICD-10-CM

## 2015-04-27 ENCOUNTER — Other Ambulatory Visit (HOSPITAL_BASED_OUTPATIENT_CLINIC_OR_DEPARTMENT_OTHER): Payer: Self-pay | Admitting: Orthopaedic Surgery

## 2015-04-28 ENCOUNTER — Ambulatory Visit: Payer: No Typology Code available for payment source | Admitting: Family Medicine

## 2015-05-03 ENCOUNTER — Encounter (HOSPITAL_BASED_OUTPATIENT_CLINIC_OR_DEPARTMENT_OTHER): Payer: Self-pay | Admitting: *Deleted

## 2015-05-05 ENCOUNTER — Ambulatory Visit (HOSPITAL_BASED_OUTPATIENT_CLINIC_OR_DEPARTMENT_OTHER): Payer: No Typology Code available for payment source | Admitting: Certified Registered"

## 2015-05-05 ENCOUNTER — Ambulatory Visit: Payer: No Typology Code available for payment source | Admitting: Family Medicine

## 2015-05-05 ENCOUNTER — Encounter (HOSPITAL_BASED_OUTPATIENT_CLINIC_OR_DEPARTMENT_OTHER)
Admission: RE | Disposition: A | Discharge status: Home / Self Care | Payer: Self-pay | Source: Ambulatory Visit | Attending: Orthopaedic Surgery

## 2015-05-05 ENCOUNTER — Ambulatory Visit (HOSPITAL_BASED_OUTPATIENT_CLINIC_OR_DEPARTMENT_OTHER)
Admission: RE | Admit: 2015-05-05 | Discharge: 2015-05-05 | Disposition: A | Discharge status: Home / Self Care | Payer: Self-pay | Source: Ambulatory Visit | Attending: Orthopaedic Surgery | Admitting: Orthopaedic Surgery

## 2015-05-05 ENCOUNTER — Encounter (HOSPITAL_BASED_OUTPATIENT_CLINIC_OR_DEPARTMENT_OTHER): Payer: Self-pay | Admitting: Certified Registered"

## 2015-05-05 ENCOUNTER — Ambulatory Visit (HOSPITAL_BASED_OUTPATIENT_CLINIC_OR_DEPARTMENT_OTHER): Payer: Self-pay | Admitting: Certified Registered"

## 2015-05-05 DIAGNOSIS — K219 Gastro-esophageal reflux disease without esophagitis: Secondary | ICD-10-CM | POA: Insufficient documentation

## 2015-05-05 DIAGNOSIS — G5601 Carpal tunnel syndrome, right upper limb: Secondary | ICD-10-CM | POA: Insufficient documentation

## 2015-05-05 DIAGNOSIS — F419 Anxiety disorder, unspecified: Secondary | ICD-10-CM | POA: Insufficient documentation

## 2015-05-05 DIAGNOSIS — Z885 Allergy status to narcotic agent status: Secondary | ICD-10-CM | POA: Insufficient documentation

## 2015-05-05 DIAGNOSIS — Z79899 Other long term (current) drug therapy: Secondary | ICD-10-CM | POA: Insufficient documentation

## 2015-05-05 DIAGNOSIS — M65312 Trigger thumb, left thumb: Secondary | ICD-10-CM | POA: Insufficient documentation

## 2015-05-05 DIAGNOSIS — E785 Hyperlipidemia, unspecified: Secondary | ICD-10-CM | POA: Insufficient documentation

## 2015-05-05 DIAGNOSIS — Z888 Allergy status to other drugs, medicaments and biological substances status: Secondary | ICD-10-CM | POA: Insufficient documentation

## 2015-05-05 DIAGNOSIS — J309 Allergic rhinitis, unspecified: Secondary | ICD-10-CM | POA: Insufficient documentation

## 2015-05-05 DIAGNOSIS — Z881 Allergy status to other antibiotic agents status: Secondary | ICD-10-CM | POA: Insufficient documentation

## 2015-05-05 DIAGNOSIS — F329 Major depressive disorder, single episode, unspecified: Secondary | ICD-10-CM | POA: Insufficient documentation

## 2015-05-05 DIAGNOSIS — J449 Chronic obstructive pulmonary disease, unspecified: Secondary | ICD-10-CM | POA: Insufficient documentation

## 2015-05-05 DIAGNOSIS — M797 Fibromyalgia: Secondary | ICD-10-CM | POA: Insufficient documentation

## 2015-05-05 DIAGNOSIS — M545 Low back pain: Secondary | ICD-10-CM | POA: Insufficient documentation

## 2015-05-05 HISTORY — PX: CARPAL TUNNEL RELEASE: SHX101

## 2015-05-05 HISTORY — DX: Depression, unspecified: F32.A

## 2015-05-05 HISTORY — DX: Anxiety disorder, unspecified: F41.9

## 2015-05-05 HISTORY — DX: Major depressive disorder, single episode, unspecified: F32.9

## 2015-05-05 HISTORY — PX: TRIGGER FINGER RELEASE: SHX641

## 2015-05-05 HISTORY — DX: Chronic obstructive pulmonary disease, unspecified: J44.9

## 2015-05-05 LAB — POCT HEMOGLOBIN-HEMACUE: Hemoglobin: 14.6 g/dL (ref 12.0–15.0)

## 2015-05-05 SURGERY — CARPAL TUNNEL RELEASE
Anesthesia: General | Laterality: Right

## 2015-05-05 MED ORDER — PROPOFOL 10 MG/ML IV BOLUS
INTRAVENOUS | Status: DC | PRN
  Administered 2015-05-05: 200 mg via INTRAVENOUS

## 2015-05-05 MED ORDER — CLINDAMYCIN PHOSPHATE 900 MG/50ML IV SOLN
900.0000 mg | INTRAVENOUS | Status: AC
  Administered 2015-05-05: 900 mg via INTRAVENOUS

## 2015-05-05 MED ORDER — OXYCODONE-ACETAMINOPHEN 5-325 MG PO TABS: 1.0000 | ORAL_TABLET | ORAL | Status: DC | PRN

## 2015-05-05 MED ORDER — BUPIVACAINE HCL (PF) 0.25 % IJ SOLN
INTRAMUSCULAR | Status: AC
  Filled 2015-05-05: qty 30

## 2015-05-05 MED ORDER — SCOPOLAMINE 1 MG/3DAYS TD PT72: 1.0000 | MEDICATED_PATCH | Freq: Once | TRANSDERMAL | Status: DC | PRN

## 2015-05-05 MED ORDER — OXYCODONE-ACETAMINOPHEN 5-325 MG PO TABS
ORAL_TABLET | ORAL | Status: AC
  Filled 2015-05-05: qty 1

## 2015-05-05 MED ORDER — GLYCOPYRROLATE 0.2 MG/ML IJ SOLN: 0.2000 mg | Freq: Once | INTRAMUSCULAR | Status: DC | PRN

## 2015-05-05 MED ORDER — SODIUM CHLORIDE 0.9 % IR SOLN
Status: DC | PRN
  Administered 2015-05-05: 1

## 2015-05-05 MED ORDER — MIDAZOLAM HCL 2 MG/2ML IJ SOLN
INTRAMUSCULAR | Status: AC
  Filled 2015-05-05: qty 2

## 2015-05-05 MED ORDER — ONDANSETRON HCL 4 MG/2ML IJ SOLN
INTRAMUSCULAR | Status: DC | PRN
  Administered 2015-05-05: 4 mg via INTRAVENOUS

## 2015-05-05 MED ORDER — DEXAMETHASONE SODIUM PHOSPHATE 10 MG/ML IJ SOLN
INTRAMUSCULAR | Status: DC | PRN
  Administered 2015-05-05: 10 mg via INTRAVENOUS

## 2015-05-05 MED ORDER — BUPIVACAINE HCL (PF) 0.25 % IJ SOLN
INTRAMUSCULAR | Status: DC | PRN
  Administered 2015-05-05: 16 mL

## 2015-05-05 MED ORDER — FENTANYL CITRATE (PF) 100 MCG/2ML IJ SOLN
INTRAMUSCULAR | Status: AC
  Filled 2015-05-05: qty 4

## 2015-05-05 MED ORDER — LIDOCAINE HCL (CARDIAC) 20 MG/ML IV SOLN
INTRAVENOUS | Status: DC | PRN
  Administered 2015-05-05: 60 mg via INTRAVENOUS

## 2015-05-05 MED ORDER — HYDROMORPHONE HCL 1 MG/ML IJ SOLN: 0.2500 mg | INTRAMUSCULAR | Status: DC | PRN

## 2015-05-05 MED ORDER — PROMETHAZINE HCL 25 MG/ML IJ SOLN: 6.2500 mg | INTRAMUSCULAR | Status: DC | PRN

## 2015-05-05 MED ORDER — FENTANYL CITRATE (PF) 100 MCG/2ML IJ SOLN
50.0000 ug | INTRAMUSCULAR | Status: DC | PRN
  Administered 2015-05-05: 100 ug via INTRAVENOUS

## 2015-05-05 MED ORDER — OXYCODONE-ACETAMINOPHEN 5-325 MG PO TABS
1.0000 | ORAL_TABLET | Freq: Once | ORAL | Status: AC
Start: 2015-05-05 — End: 2015-05-05
  Administered 2015-05-05: 1 via ORAL

## 2015-05-05 MED ORDER — MIDAZOLAM HCL 2 MG/2ML IJ SOLN: 1.0000 mg | INTRAMUSCULAR | Status: DC | PRN

## 2015-05-05 MED ORDER — CLINDAMYCIN PHOSPHATE 900 MG/50ML IV SOLN
INTRAVENOUS | Status: AC
  Filled 2015-05-05: qty 50

## 2015-05-05 MED ORDER — LACTATED RINGERS IV SOLN
INTRAVENOUS | Status: DC
  Administered 2015-05-05: 11:00:00 via INTRAVENOUS

## 2015-05-05 SURGICAL SUPPLY — 43 items
BANDAGE ELASTIC 3 VELCRO ST LF (GAUZE/BANDAGES/DRESSINGS) ×4 IMPLANT
BLADE MINI RND TIP GREEN BEAV (BLADE) ×4 IMPLANT
BLADE SURG 15 STRL LF DISP TIS (BLADE) ×2 IMPLANT
BLADE SURG 15 STRL SS (BLADE) ×2
BNDG ESMARK 4X9 LF (GAUZE/BANDAGES/DRESSINGS) ×4 IMPLANT
BRUSH SCRUB EZ PLAIN DRY (MISCELLANEOUS) ×8 IMPLANT
CORDS BIPOLAR (ELECTRODE) ×8 IMPLANT
COVER BACK TABLE 60X90IN (DRAPES) ×4 IMPLANT
COVER MAYO STAND STRL (DRAPES) ×4 IMPLANT
CUFF TOURNIQUET SINGLE 18IN (TOURNIQUET CUFF) ×8 IMPLANT
DECANTER SPIKE VIAL GLASS SM (MISCELLANEOUS) IMPLANT
DRAPE EXTREMITY T 121X128X90 (DRAPE) ×8 IMPLANT
DRAPE SURG 17X23 STRL (DRAPES) ×8 IMPLANT
GAUZE SPONGE 4X4 12PLY STRL (GAUZE/BANDAGES/DRESSINGS) ×4 IMPLANT
GAUZE SPONGE 4X4 16PLY XRAY LF (GAUZE/BANDAGES/DRESSINGS) IMPLANT
GAUZE XEROFORM 1X8 LF (GAUZE/BANDAGES/DRESSINGS) ×4 IMPLANT
GLOVE BIO SURGEON STRL SZ 6.5 (GLOVE) ×6 IMPLANT
GLOVE BIO SURGEONS STRL SZ 6.5 (GLOVE) ×2
GLOVE BIOGEL PI IND STRL 7.0 (GLOVE) ×4 IMPLANT
GLOVE BIOGEL PI INDICATOR 7.0 (GLOVE) ×4
GLOVE ECLIPSE 6.5 STRL STRAW (GLOVE) ×8 IMPLANT
GLOVE NEODERM STRL 7.5 LF PF (GLOVE) ×4 IMPLANT
GLOVE SURG NEODERM 7.5  LF PF (GLOVE) ×4
GLOVE SURG SYN 7.5  E (GLOVE) ×4
GLOVE SURG SYN 7.5 E (GLOVE) ×4 IMPLANT
GOWN PREVENTION PLUS XLARGE (GOWN DISPOSABLE) ×4 IMPLANT
GOWN STRL REIN XL XLG (GOWN DISPOSABLE) ×8 IMPLANT
NEEDLE HYPO 22GX1.5 SAFETY (NEEDLE) IMPLANT
NS IRRIG 1000ML POUR BTL (IV SOLUTION) ×4 IMPLANT
PACK BASIN DAY SURGERY FS (CUSTOM PROCEDURE TRAY) ×4 IMPLANT
PAD CAST 3X4 CTTN HI CHSV (CAST SUPPLIES) ×2 IMPLANT
PADDING CAST COTTON 3X4 STRL (CAST SUPPLIES) ×2
PADDING UNDERCAST 2 STRL (CAST SUPPLIES) ×2
PADDING UNDERCAST 2X4 STRL (CAST SUPPLIES) ×2 IMPLANT
RUBBERBAND STERILE (MISCELLANEOUS) IMPLANT
SPLINT FIBERGLASS 3X35 (CAST SUPPLIES) ×4 IMPLANT
STOCKINETTE 4X48 STRL (DRAPES) ×4 IMPLANT
SUT ETHILON 4 0 PS 2 18 (SUTURE) ×4 IMPLANT
SYR BULB 3OZ (MISCELLANEOUS) ×4 IMPLANT
SYR CONTROL 10ML LL (SYRINGE) IMPLANT
TOWEL OR 17X24 6PK STRL BLUE (TOWEL DISPOSABLE) ×8 IMPLANT
TRAY DSU PREP LF (CUSTOM PROCEDURE TRAY) ×4 IMPLANT
UNDERPAD 30X30 (UNDERPADS AND DIAPERS) ×8 IMPLANT

## 2015-05-05 NOTE — Op Note (Signed)
   DATE OF SURGERY:05/05/2015  PREOPERATIVE DIAGNOSIS:   1. Right Carpal tunnel syndrome 2. Left trigger thumb  POSTOPERATIVE DIAGNOSIS: same  PROCEDURE: 1. Right carpal tunnel release 2. Left trigger thumb release  SURGEON: Surgeon(s): Naiping Donnelly Stager, MD  ANESTHESIA:  General  TOURNIQUET TIME: less than 10 minutes on each side  BLOOD LOSS: Minimal.  COMPLICATIONS: None.  PATHOLOGY: None.  INDICATIONS: The patient is a 49 y.o. -year-old female who presented with carpal tunnel syndrome and trigger thumb failing nonsurgical management, indicated for surgical release.  DESCRIPTION OF PROCEDURE: The patient was identified in the preoperative holding area.  The operative sites were marked by the surgeon and confirmed by the patient.  She was brought back to the operating room.  Anesthesia was induced by the anesthesia team.  A well padded nonsterile tourniquet was placed on both upper arms. The operative extremities was prepped and draped in standard sterile fashion.  A timeout was performed.  Preoperative antibiotics were given.   A palmar incision was made about 5 mm ulnar to the thenar crease.  The palmar aponeurosis was exposed and divided in line with the skin incision. The palmaris brevis was visualized and divided.  The distal edge of the transcarpal ligament was identified. A hemostat was inserted into the carpal tunnel to protect the median nerve and the flexor tendons. Then, the transverse carpal ligament was released under direct visualization. Proximally, a subcutaneous tunnel was made allowing a Sewell retractor to be placed. Then, the distal portion of the antebrachial fascia was released. Distally, all fibrous bands were released. The median nerve was visualized, and the fat pad was exposed. Following release, local infiltration with 0.25% of Sensorcaine was given. The tourniquet was deflated. Hemostasis achieved.  Wound was irrigated and closed with 4-0 nylon sutures. Sterile  dressing applied. We then turned our attention to the left trigger thumb.  A horizontal incision based in the palmar thumb flexion crease in line with the thumb was used. Blunt dissection was taken down to the level of the flexor tendon. The neurovascular bundles were identified on each side of the tendon sheath and protected. The proximal edge of the A1 pulley was identified. This was sharply incised. Of note the tendon was of good quality and did not exhibit any tears. The A1 pulley was incised along its full width. Care was taken not to violate the A2 or oblique pulley. The tourniquet was then deflated and hemostasis was obtained. Local anesthesia was infiltrated. The wound was thoroughly irrigated and closed with 4-0 nylon sutures. Sterile dressings were applied and the hand was placed in a soft dressing. Patient tolerated the procedure well and was taken to the PACU in stable condition.The patient was transferred to the recovery room in stable condition after all counts were correct.  POSTOPERATIVE PLAN: To start nerve gliding exercises as tolerated and no heavy lifting for four weeks.    Mayra Reel, MD [facility] [phone] 12:29 PM

## 2015-05-05 NOTE — Transfer of Care (Signed)
Immediate Anesthesia Transfer of Care Note  Patient: Briana Parker  Procedure(s) Performed: Procedure(s): RIGHT CARPAL TUNNEL RELEASE (Right) LEFT TRIGGER THUMB RELEASE (Left)  Patient Location: PACU  Anesthesia Type:General  Level of Consciousness: awake  Airway & Oxygen Therapy: Patient Spontanous Breathing and Patient connected to face mask oxygen  Post-op Assessment: Report given to RN and Post -op Vital signs reviewed and stable  Post vital signs: Reviewed and stable  Last Vitals:  Filed Vitals:   05/05/15 1003  BP: 116/86  Pulse: 94  Temp: 36.8 C  Resp: 16    Complications: No apparent anesthesia complications

## 2015-05-05 NOTE — Anesthesia Procedure Notes (Signed)
Procedure Name: LMA Insertion Date/Time: 05/05/2015 11:49 AM Performed by: Curly [place] Pre-anesthesia Checklist: Patient identified, Emergency Drugs available, Suction available and Patient being monitored Patient Re-evaluated:Patient Re-evaluated prior to inductionOxygen Delivery Method: Circle System Utilized Preoxygenation: Pre-oxygenation with 100% oxygen Intubation Type: IV induction Ventilation: Mask ventilation without difficulty LMA: LMA inserted LMA Size: 4.0 Number of attempts: 1 Airway Equipment and Method: Bite block Placement Confirmation: positive ETCO2 and breath sounds checked- equal and bilateral Tube secured with: Tape Dental Injury: Teeth and Oropharynx as per pre-operative assessment

## 2015-05-05 NOTE — Anesthesia Preprocedure Evaluation (Signed)
Anesthesia Evaluation  Patient identified by MRN, date of birth, ID band Patient awake    Reviewed: Allergy & Precautions, Patient's Chart, lab work & pertinent test results  Airway Mallampati: II       Dental  (+) Poor Dentition   Pulmonary COPD breath sounds clear to auscultation        Cardiovascular negative cardio ROS  Rhythm:Regular Rate:Normal     Neuro/Psych    GI/Hepatic Neg liver ROS, GERD-  ,  Endo/Other  negative endocrine ROS  Renal/GU negative Renal ROS     Musculoskeletal  (+) Arthritis -,   Abdominal   Peds  Hematology   Anesthesia Other Findings   Reproductive/Obstetrics                             Anesthesia Physical Anesthesia Plan  ASA: II  Anesthesia Plan: General   Post-op Pain Management:    Induction: Intravenous  Airway Management Planned: LMA  Additional Equipment:   Intra-op Plan:   Post-operative Plan: Extubation in OR  Informed Consent: I have reviewed the patients History and Physical, chart, labs and discussed the procedure including the risks, benefits and alternatives for the proposed anesthesia with the patient or authorized representative who has indicated his/her understanding and acceptance.   Dental advisory given  Plan Discussed with: CRNA and Surgeon  Anesthesia Plan Comments:         Anesthesia Quick Evaluation

## 2015-05-05 NOTE — Anesthesia Postprocedure Evaluation (Signed)
  Anesthesia Post-op Note  Patient: Briana Parker  Procedure(s) Performed: Procedure(s): RIGHT CARPAL TUNNEL RELEASE (Right) LEFT TRIGGER THUMB RELEASE (Left)  Patient Location: PACU  Anesthesia Type:General  Level of Consciousness: awake and alert   Airway and Oxygen Therapy: Patient Spontanous Breathing  Post-op Pain: mild  Post-op Assessment: Post-op Vital signs reviewed              Post-op Vital Signs: stable  Last Vitals:  Filed Vitals:   05/05/15 1341  BP: 132/80  Pulse: 88  Temp: 36.5 C  Resp: 18    Complications: No apparent anesthesia complications

## 2015-05-05 NOTE — H&P (Signed)
PREOPERATIVE H&P  Chief Complaint: right carpal tunnel syndrome, left trigger thumb  HPI: Briana Parker is a 49 y.o. female who presents for surgical treatment of right carpal tunnel syndrome, left trigger thumb.  She denies any changes in medical history.  Past Medical History  Diagnosis Date  . Fibromyalgia   . Hyperlipidemia   . Low back pain   . Allergic rhinitis   . Allergy     seasonal  . Arthritis     back  . Chronic bronchitis   . GERD (gastroesophageal reflux disease)   . COPD (chronic obstructive pulmonary disease)   . Anxiety   . Depression    Past Surgical History  Procedure Laterality Date  . Abdominal hysterectomy    . Wisdom tooth extraction     Social History   Social History  . Marital Status: Married    Spouse Name: N/A  . Number of Children: N/A  . Years of Education: N/A   Social History Main Topics  . Smoking status: Never Smoker   . Smokeless tobacco: Never Used  . Alcohol Use: No  . Drug Use: No  . Sexual Activity: Yes    Birth Control/ Protection: Surgical   Other Topics Concern  . None   Social History Narrative   Exercises Regularly, currently takes care of one child.  Lives at home with daughter.   Family History  Problem Relation Age of Onset  . Coronary artery disease      Strong family history in first degree relatives  . Diabetes Mother     died recently in 02-15-2010 due to 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  She was on dialysis and died with cardiac arrest as per pt.  . Heart disease Father   . Hypertension Father   . Colon cancer Neg Hx    Allergies  Allergen Reactions  . Augmentin [Amoxicillin-Pot Clavulanate] Diarrhea  . Codeine     REACTION: unknown reaction  . Hydrocodone   . Pravastatin Sodium     REACTION: Pruritis and muscle pain  . Simvastatin     REACTION: Abdominal pain, nausea, vomitting   Prior to Admission medications   Medication Sig Start Date End Date Taking? Authorizing Provider  albuterol (PROVENTIL  HFA;VENTOLIN HFA) 108 (90 BASE) MCG/ACT inhaler Inhale 2 puffs into the lungs 4 (four) times daily as needed for wheezing or shortness of breath. 11/05/14  Yes Gust Rung, DO  albuterol (PROVENTIL) (2.5 MG/3ML) 0.083% nebulizer solution Take 3 mLs (2.5 mg total) by nebulization every 6 (six) hours as needed for wheezing or shortness of breath. 05/25/14  Yes Gust Rung, DO  calcium-vitamin D (OSCAL WITH D) 500-200 MG-UNIT per tablet Take 1 tablet by mouth daily with breakfast.   Yes Historical Provider, MD  conjugated estrogens (PREMARIN) vaginal cream Place vaginally 2 (two) times a week. 0.5 gram of cream intravaginally administered twice weekly. 07/15/14  Yes Baltazar Apo, MD  cyclobenzaprine (FLEXERIL) 10 MG tablet Take 1 tablet (10 mg total) by mouth 3 (three) times daily as needed. 02/11/15  Yes Gust Rung, DO  mometasone (NASONEX) 50 MCG/ACT nasal spray Place 2 sprays into the nose daily. 02/11/15 02/08/16 Yes Gust Rung, DO  Multiple Vitamins-Minerals (MULTIVITAMIN ADULTS 50+ PO) Take by mouth.   Yes Historical Provider, MD  naproxen (NAPROSYN) 500 MG tablet Take 1 tablet (500 mg total) by mouth 2 (two) times daily with a meal. 02/11/15  Yes Gust Rung, DO  omeprazole (PRILOSEC)  40 MG capsule Take 1 capsule (40 mg total) by mouth daily. 02/11/15 02/11/16 Yes Gust Rung, DO  rosuvastatin (CRESTOR) 10 MG tablet Take 1 tablet (10 mg total) by mouth daily. 02/11/15  Yes Gust Rung, DO  polyethylene glycol (MIRALAX / GLYCOLAX) packet Take 17 g by mouth daily. 09/14/14   Gust Rung, DO     Positive ROS: All other systems have been reviewed and were otherwise negative with the exception of those mentioned in the HPI and as above.  Physical Exam: General: Alert, no acute distress Cardiovascular: No pedal edema Respiratory: No cyanosis, no use of accessory musculature GI: abdomen soft Skin: No lesions in the area of chief complaint Neurologic: Sensation intact  distally Psychiatric: Patient is competent for consent with normal mood and affect Lymphatic: no lymphedema  MUSCULOSKELETAL: exam stable  Assessment: right carpal tunnel syndrome, left trigger thumb  Plan: Plan for Procedure(s): RIGHT CARPAL TUNNEL RELEASE LEFT TRIGGER THUMB RELEASE  The risks benefits and alternatives were discussed with the patient including but not limited to the risks of nonoperative treatment, versus surgical intervention including infection, bleeding, nerve injury,  blood clots, cardiopulmonary complications, morbidity, mortality, among others, and they were willing to proceed.   Cheral Almas, MD   05/05/2015 9:23 AM

## 2015-05-05 NOTE — Discharge Instructions (Signed)
Postoperative instructions:  Weightbearing: as tolerated  For the right hand, Dressing instructions: Keep your dressing and/or splint clean and dry at all times.  It will be removed at your first post-operative appointment.  Your stitches and/or staples will be removed at this visit.  For the left hand, Keep your dressing and/or splint clean and dry at all times.  You can remove your dressing on post-operative day #3 and change with a dry/sterile dressing or Band-Aids as needed thereafter.    Incision instructions:  Do not soak your incision for 3 weeks after surgery.  If the incision gets wet, pat dry and do not scrub the incision.  Pain control:  You have been given a prescription to be taken as directed for post-operative pain control.  In addition, elevate the operative extremity above the heart at all times to prevent swelling and throbbing pain.  Take over-the-counter Colace,  by mouth twice a day while taking narcotic pain medications to help prevent constipation.  Follow up appointments: 1) 10-14 days for suture removal and wound check. 2) Dr. Roda Shutters as scheduled.   -------------------------------------------------------------------------------------------------------------  After Surgery Pain Control:  After your surgery, post-surgical discomfort or pain is likely. This discomfort can last several days to a few weeks. At certain times of the day your discomfort may be more intense.  Did you receive a nerve block?  A nerve block can provide pain relief for one hour to two days after your surgery. As long as the nerve block is working, you will experience little or no sensation in the area the surgeon operated on.  As the nerve block wears off, you will begin to experience pain or discomfort. It is very important that you begin taking your prescribed pain medication before the nerve block fully wears off. Treating your pain at the first sign of the block wearing off will ensure your  pain is better controlled and more tolerable when full-sensation returns. Do not wait until the pain is intolerable, as the medicine will be less effective. It is better to treat pain in advance than to try and catch up.  General Anesthesia:  If you did not receive a nerve block during your surgery, you will need to start taking your pain medication shortly after your surgery and should continue to do so as prescribed by your surgeon.  Pain Medication:  Most commonly we prescribe Vicodin and Percocet for post-operative pain. Both of these medications contain a combination of acetaminophen (Tylenol) and a narcotic to help control pain.   It takes between 30 and 45 minutes before pain medication starts to work. It is important to take your medication before your pain level gets too intense.   Nausea is a common side effect of many pain medications. You will want to eat something before taking your pain medicine to help prevent nausea.   If you are taking a prescription pain medication that contains acetaminophen, we recommend that you do not take additional over the counter acetaminophen (Tylenol).  Other pain relieving options:   Using a cold pack to ice the affected area a few times a day (15 to 20 minutes at a time) can help to relieve pain, reduce swelling and bruising.   Elevation of the affected area can also help to reduce pain and swelling.     Post Anesthesia Home Care Instructions  Activity: Get plenty of rest for the remainder of the day. A responsible adult should stay with you for 24 hours following the procedure.  For the next 24 hours, DO NOT: -Drive a car -Advertising copywriter -Drink alcoholic beverages -Take any medication unless instructed by your physician -Make any legal decisions or sign important papers.  Meals: Start with liquid foods such as gelatin or soup. Progress to regular foods as tolerated. Avoid greasy, spicy, heavy foods. If nausea and/or vomiting occur,  drink only clear liquids until the nausea and/or vomiting subsides. Call your physician if vomiting continues.  Special Instructions/Symptoms: Your throat may feel dry or sore from the anesthesia or the breathing tube placed in your throat during surgery. If this causes discomfort, gargle with warm salt water. The discomfort should disappear within 24 hours.  If you had a scopolamine patch placed behind your ear for the management of post- operative nausea and/or vomiting:  1. The medication in the patch is effective for 72 hours, after which it should be removed.  Wrap patch in a tissue and discard in the trash. Wash hands thoroughly with soap and water. 2. You may remove the patch earlier than 72 hours if you experience unpleasant side effects which may include dry mouth, dizziness or visual disturbances. 3. Avoid touching the patch. Wash your hands with soap and water after contact with the patch.

## 2015-05-06 ENCOUNTER — Encounter (HOSPITAL_BASED_OUTPATIENT_CLINIC_OR_DEPARTMENT_OTHER): Payer: Self-pay | Admitting: Orthopaedic Surgery

## 2015-05-19 ENCOUNTER — Ambulatory Visit (INDEPENDENT_AMBULATORY_CARE_PROVIDER_SITE_OTHER): Payer: Self-pay | Admitting: *Deleted

## 2015-05-19 DIAGNOSIS — Z23 Encounter for immunization: Secondary | ICD-10-CM

## 2015-05-20 ENCOUNTER — Telehealth: Payer: Self-pay | Admitting: *Deleted

## 2015-05-20 NOTE — Telephone Encounter (Signed)
Call to patient wanted to know if she needed any more immunizations.  Patient was called per review of Dr. Mikey Bussing is currently up to date on her immunizations .  Patient voiced understanding of the message.  Angelina Ok, RN 05/20/2015 11:08 AM.

## 2015-06-30 ENCOUNTER — Ambulatory Visit: Payer: Self-pay | Admitting: Family Medicine

## 2015-07-14 ENCOUNTER — Other Ambulatory Visit: Payer: Self-pay | Admitting: *Deleted

## 2015-07-14 DIAGNOSIS — M545 Low back pain: Principal | ICD-10-CM

## 2015-07-14 DIAGNOSIS — G8929 Other chronic pain: Secondary | ICD-10-CM

## 2015-07-15 MED ORDER — CYCLOBENZAPRINE HCL 10 MG PO TABS: 10.0000 mg | ORAL_TABLET | Freq: Three times a day (TID) | ORAL | Status: DC | PRN

## 2015-07-19 ENCOUNTER — Other Ambulatory Visit: Payer: Self-pay | Admitting: Internal Medicine

## 2015-07-19 NOTE — Telephone Encounter (Signed)
[facility] °

## 2015-07-19 NOTE — Telephone Encounter (Signed)
Called pt to see about specific needs for refill.  No answer, will try again

## 2015-07-20 NOTE — Telephone Encounter (Signed)
Called pharm, pt has refills available on all meds, anywhere from 3 m to 1 yr worth of refills. Called pt and informed her, she is agreeable

## 2015-08-03 ENCOUNTER — Encounter: Payer: Self-pay | Admitting: Student

## 2015-08-11 ENCOUNTER — Encounter: Payer: Self-pay | Admitting: Family Medicine

## 2015-08-11 ENCOUNTER — Ambulatory Visit (INDEPENDENT_AMBULATORY_CARE_PROVIDER_SITE_OTHER): Payer: Self-pay | Admitting: Family Medicine

## 2015-08-11 VITALS — BP 124/78 | Ht 64.0 in | Wt 140.0 lb

## 2015-08-11 DIAGNOSIS — G5603 Carpal tunnel syndrome, bilateral upper limbs: Secondary | ICD-10-CM

## 2015-08-11 DIAGNOSIS — M6530 Trigger finger, unspecified finger: Secondary | ICD-10-CM | POA: Insufficient documentation

## 2015-08-11 MED ORDER — METHYLPREDNISOLONE ACETATE 40 MG/ML IJ SUSP
40.0000 mg | Freq: Once | INTRAMUSCULAR | Status: AC
  Administered 2015-08-11: 40 mg via INTRA_ARTICULAR

## 2015-08-11 NOTE — Assessment & Plan Note (Signed)
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 -She will use a Band-Aid splint to the metacarpal joint limiting interphalangeal joint of the left thumb. -Ultrasound-guided injection to the flexor pollicis longus tendon sheath and A1 pulley region with the visual distention underneath the A1 pulley. -She will follow-up in 3-4 weeks to see how she is doing at that time. -Discussed etiology and nature of this including splinting and injection that have a 50% chance of working past 1 year. If this does not work she will need a trigger finger release by a Hydrographic surveyorhand surgeon.   Aspiration/Injection Procedure Note Briana Parker 09/26/1965  Procedure: Injection Indications: trigger  Procedure Details Consent: Risks of procedure as well as the alternatives and risks of each were explained to the (patient/caregiver).  Consent for procedure obtained. verbal and she understands risk of atrophy, pigmentation, or numbness.  Time Out: Verified patient identification, verified procedure, site/side was marked, verified correct patient position, special equipment/implants available, medications/allergies/relevent history reviewed, required imaging and test results available.  Performed.  The area was cleaned with iodine and alcohol swabs.    The R trigger thumb was injected using 0.5 cc's of 40 Depomedrol and 0.5 cc's of 1% lidocaine with a 27 1 1/2" needle.  Ultrasound was used. Images were obtained in Transverse and Long views showing the injection.    A sterile dressing was applied.  Patient did tolerate procedure well. Estimated blood loss: none

## 2015-08-11 NOTE — Progress Notes (Signed)
  Briana Parker - 49 y.o. female MRN [id number]  Date of birth: 02/19/1966 Briana Parker is a 49 y.o. female who presents today for bilateral hand numbness along with triggering of the left thumb.  Bilateral hand numbness, initial visit-patient has been having ongoing hand numbness for several years now. She has been previously diagnosed with carpal tunnel syndrome without having formal electrodiagnostic testing. The tingling and numbness is present in the first 3 digits on both hands. 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 She does endorse night time awakenings with pain especially in the right hand. She started using a wrist cockup splints in March 2016, and she has not noticed too much of a difference. She denies frank weakness or grip strength weakness in either hand. She has never had the area injected.  She does endorse a positive Flick sign with the right hand being worse than the left.  F/u visit 08/11/15 - patient had EMG done confirming moderate bilateral carpal tunnel with right greater than left done in August 2016 at [facility]. She eventually had a carpal tunnel release on the right done on 05/05/2015. She continues to have problems with the left one despite cock-up splint and activity modification.  Right thumb trigger finger, initial visit - patient endorsing triggering of the right thumb which is been ongoing now for 3-4 months. It is worse at the end of the day, and she has noticed that the locking has progressed. There is a palpable nodule and is now painful in the metacarpal region. She has tried taping the area which has helped somewhat. Other than that she has not tried any treatment. Did previously had left trigger finger injection and diagnosis with recalcitrant problems in which she had surgery done for A1 pulley release on 05/05/2015  PMHx - Updated and reviewed.  Contributory factors  include: Asthma PSHx - Updated and reviewed.  Contributory factors include: Noncontributory FHx - Updated and reviewed.  Contributory factors include:  Noncontributory Medications - Naprosyn 500 mg twice a day   ROS Per HPI   Exam:  Filed Vitals:   08/11/15 1338  BP: 124/78   Gen: NAD Cardiorespiratory - Normal respiratory effort/rate.  RRR Wrist: Inspection normal with no visible erythema or swelling.  Healed incision on R  ROM smooth and normal with good flexion and extension and ulnar/radial deviation that is symmetrical with opposite wrist. Palpation is normal over metacarpals, navicular, lunate, and TFCC; tendons without tenderness/ swelling Strength 5/5 in all directions without pain. Negative Finkelstein Positive flick, tinel's and phalens. Positive tenderness to palpation with palpable nodule at the A1 pulley metacarpal head on the R palmar region of the thumb. There is also palpatory triggering of the digit. Neurovascularly intact bilateral

## 2015-08-11 NOTE — Assessment & Plan Note (Signed)
CTS hydrodissection today.  CTS causing Sx despite wrist splints.    Aspiration/Injection Procedure Note Briana Parker 12/15/1965  Procedure: Injection Indications: L CTS   Procedure Details Consent: Risks of procedure as well as the alternatives and risks of each were explained to the (patient/caregiver).  Consent for procedure obtained. Time Out: Verified patient identification, verified procedure, site/side was marked, verified correct patient position, special equipment/implants available, medications/allergies/relevent history reviewed, required imaging and test results available.  Performed.  The area was cleaned with iodine and alcohol swabs.    The L CT was injected using 1.5 cc's of 40mg  Depomedrol and 1.5 cc's of 1% lidocaine with a 21 1 1/2" needle.  Ultrasound was used. Images were obtained in Transverse and Long views showing the injection.     Patient did tolerate procedure well. Estimated blood loss: None  F/U PRN

## 2015-08-23 ENCOUNTER — Other Ambulatory Visit: Payer: Self-pay | Admitting: Internal Medicine

## 2015-08-27 ENCOUNTER — Other Ambulatory Visit: Payer: Self-pay | Admitting: Internal Medicine

## 2015-08-27 NOTE — Telephone Encounter (Signed)
Pt requesting albuterol nebulizer solution to be filled @ [facility] on [address].

## 2015-08-27 NOTE — Telephone Encounter (Signed)
rec'd surescripts request also

## 2015-09-08 ENCOUNTER — Other Ambulatory Visit: Payer: Self-pay | Admitting: Internal Medicine

## 2015-09-08 MED ORDER — ALBUTEROL SULFATE (2.5 MG/3ML) 0.083% IN NEBU: 2.5000 mg | INHALATION_SOLUTION | RESPIRATORY_TRACT | Status: DC

## 2015-09-08 NOTE — Telephone Encounter (Signed)
Albuterol neb solution rx called to [facility].

## 2015-09-08 NOTE — Telephone Encounter (Signed)
Please phone in

## 2015-09-08 NOTE — Telephone Encounter (Signed)
Pt requesting albuterol nebulizer solution to be sent to [facility] instead of [facility]. Pt states albuterol is too expensive at [facility] can not afford.

## 2015-09-22 ENCOUNTER — Ambulatory Visit: Payer: Self-pay

## 2015-09-23 ENCOUNTER — Ambulatory Visit: Payer: Self-pay

## 2015-10-06 ENCOUNTER — Encounter: Payer: Self-pay | Admitting: Internal Medicine

## 2015-10-06 ENCOUNTER — Ambulatory Visit (INDEPENDENT_AMBULATORY_CARE_PROVIDER_SITE_OTHER): Payer: Self-pay | Admitting: Internal Medicine

## 2015-10-06 VITALS — BP 111/82 | HR 88 | Temp 97.7°F | Ht 62.0 in | Wt 145.6 lb

## 2015-10-06 DIAGNOSIS — G8929 Other chronic pain: Secondary | ICD-10-CM | POA: Insufficient documentation

## 2015-10-06 DIAGNOSIS — M545 Low back pain, unspecified: Secondary | ICD-10-CM

## 2015-10-06 DIAGNOSIS — N951 Menopausal and female climacteric states: Secondary | ICD-10-CM

## 2015-10-06 DIAGNOSIS — B3731 Acute candidiasis of vulva and vagina: Secondary | ICD-10-CM

## 2015-10-06 DIAGNOSIS — E785 Hyperlipidemia, unspecified: Secondary | ICD-10-CM

## 2015-10-06 DIAGNOSIS — M62838 Other muscle spasm: Secondary | ICD-10-CM

## 2015-10-06 DIAGNOSIS — J45990 Exercise induced bronchospasm: Secondary | ICD-10-CM

## 2015-10-06 DIAGNOSIS — M542 Cervicalgia: Secondary | ICD-10-CM

## 2015-10-06 DIAGNOSIS — Z7951 Long term (current) use of inhaled steroids: Secondary | ICD-10-CM

## 2015-10-06 DIAGNOSIS — B373 Candidiasis of vulva and vagina: Secondary | ICD-10-CM

## 2015-10-06 MED ORDER — POLYETHYLENE GLYCOL 3350 17 G PO PACK: 17.0000 g | PACK | Freq: Every day | ORAL | Status: DC

## 2015-10-06 MED ORDER — MOMETASONE FUROATE 50 MCG/ACT NA SUSP: 2.0000 | Freq: Every day | NASAL | Status: DC

## 2015-10-06 MED ORDER — NAPROXEN 500 MG PO TABS: 500.0000 mg | ORAL_TABLET | Freq: Two times a day (BID) | ORAL | Status: DC

## 2015-10-06 MED ORDER — CYCLOBENZAPRINE HCL 10 MG PO TABS: 10.0000 mg | ORAL_TABLET | Freq: Three times a day (TID) | ORAL | Status: DC | PRN

## 2015-10-06 MED ORDER — ALBUTEROL SULFATE (2.5 MG/3ML) 0.083% IN NEBU: 2.5000 mg | INHALATION_SOLUTION | RESPIRATORY_TRACT | Status: DC

## 2015-10-06 MED ORDER — ALBUTEROL SULFATE HFA 108 (90 BASE) MCG/ACT IN AERS: 2.0000 | INHALATION_SPRAY | Freq: Four times a day (QID) | RESPIRATORY_TRACT | Status: DC | PRN

## 2015-10-06 MED ORDER — FLUTICASONE-SALMETEROL 100-50 MCG/DOSE IN AEPB: 1.0000 | INHALATION_SPRAY | Freq: Two times a day (BID) | RESPIRATORY_TRACT | Status: DC

## 2015-10-06 MED ORDER — ESTROGENS, CONJUGATED 0.625 MG/GM VA CREA: TOPICAL_CREAM | VAGINAL | Status: DC

## 2015-10-06 MED ORDER — FLUCONAZOLE 150 MG PO TABS: 150.0000 mg | ORAL_TABLET | Freq: Every day | ORAL | Status: DC

## 2015-10-06 NOTE — Progress Notes (Signed)
Case discussed with Dr. Mikey Bussing soon after the resident saw the patient.  We reviewed the resident's history and exam and pertinent patient test results.  I agree with the assessment, diagnosis, and plan of care documented in the resident's note.  If symptoms of exercise induced asthma persist despite the inhaled corticosteroid, she may benefit more from a long-acting beta-agonist to be taken 30-60 minutes prior to an anticipated exertion, or, if her exertion can not be predicted, then on a scheduled basis in combination with the inhaled steroid.

## 2015-10-06 NOTE — Assessment & Plan Note (Signed)
HPI: She reports chronic low back pain, occasional neck and shoulder pain.  She has been taking naproxen  BID when her pain is bad but does not take this on a regular basis.  She feels flexeril was very effective for the muscular component of her pain but that it has become less effective lately.  She has been taking at least  once a day of flexeril every day.  She does use heat to the area at night and did have a massage which was very effective.  A: Chronic low back pain and neck pain, muscle spasm  P: - Continue Naproxen  BID - Advised to use flexeril in short burst not as dialy medication - Continue heat - Continue massage if can afford - Introduce stretching program.

## 2015-10-06 NOTE — Assessment & Plan Note (Signed)
HPI: Has been taking crestor without issue.  She asks if her cholesterol needs to be rechecked   A: Hyperlipidemia  P: Crestor was effective at reducing her LDL from 182 to 82, I told her we likely do not need to repeat this but certainly do not need to within the year.

## 2015-10-06 NOTE — Assessment & Plan Note (Signed)
HPI: Reports vaginal itching and white creamy discharge for the last 3 days.  She reports she is rarely sexually active with her husband only.  She does have vaginal dryness and uses premirn sparingly.  A: Vaginal candidiasis  P: Fluconazole  x1

## 2015-10-06 NOTE — Assessment & Plan Note (Signed)
HPI: SHe continues to have some hot flashes. She was taking some nortriptline but does not feel that is effective.  Overall she is most bothered by vaginal dryness and feels that premarin helps.  A: Menopause syndrome  P:- Refill premarin

## 2015-10-06 NOTE — Assessment & Plan Note (Signed)
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  She is needing albuterol on a daily basis and sometimes more than once daily.  She only needs when moving and is not being awoken at night with SOB.  A: Exercise induced asthma  P: -Albterol alone does not seem to be effective enough at controlling her symptoms.  I feel she would benefit from a low dose daily inhaled corticosteroid to help her symptoms and decrease albuterol use. - Continue Albuterol PRN - Add Advair daily

## 2015-10-06 NOTE — Patient Instructions (Signed)
General Instructions:  Please let me know if you cannot get your medications  Please bring your medicines with you each time you come to clinic.  Medicines may include prescription medications, over-the-counter medications, herbal remedies, eye drops, vitamins, or other pills.   Progress Toward Treatment Goals:  No flowsheet data found.  Self Care Goals & Plans:  No flowsheet data found.  No flowsheet data found.   Care Management & Community Referrals:  No flowsheet data found.

## 2015-10-06 NOTE — Progress Notes (Signed)
[facility] Subjective:   Patient ID: Briana Parker female   DOB: 1966-01-02 50 y.o.   MRN: [id number]  HPI: [name] is a 50 y.o. female with a PMH detailed below who presents for 6 months follow up for chronic low back and neck pain, asthma  Please see problem based charting below for the status of her chronic medical problems.    Past Medical History  Diagnosis Date  . Fibromyalgia   . Hyperlipidemia   . Low back pain   . Allergic rhinitis   . Allergy     seasonal  . Arthritis     back  . Chronic bronchitis (HCC)   . GERD (gastroesophageal reflux disease)   . COPD (chronic obstructive pulmonary disease) (HCC)   . Anxiety   . Depression    Current Outpatient Prescriptions  Medication Sig Dispense Refill  . albuterol (PROVENTIL HFA;VENTOLIN HFA) 108 (90 BASE) MCG/ACT inhaler Inhale 2 puffs into the lungs 4 (four) times daily as needed for wheezing or shortness of breath. 3 Inhaler 3  . albuterol (PROVENTIL) (2.5 MG/3ML) 0.083% nebulizer solution Take 3 mLs (2.5 mg total) by nebulization as directed. USE ONE VIAL IN NEBULIZER EVERY 6 HOURS AS NEEDED FOR WHEEZING OR SHORTNESS OF BREATH 75 vial 3  . calcium-vitamin D (OSCAL WITH D) 500-200 MG-UNIT per tablet Take 1 tablet by mouth daily with breakfast.    . conjugated estrogens (PREMARIN) vaginal cream Place vaginally 2 (two) times a week. 0.5 gram of cream intravaginally administered twice weekly. 42.5 g 0  . cyclobenzaprine (FLEXERIL) 10 MG tablet Take 1 tablet (10 mg total) by mouth 3 (three) times daily as needed. 90 tablet 2  . mometasone (NASONEX) 50 MCG/ACT nasal spray Place 2 sprays into the nose daily. 17 g 6  . Multiple Vitamins-Minerals (MULTIVITAMIN ADULTS 50+ PO) Take by mouth.    . naproxen (NAPROSYN) 500 MG tablet Take 1 tablet (500 mg total) by mouth 2 (two) times daily with a meal. 60 tablet 5  . omeprazole (PRILOSEC) 40 MG capsule Take 1 capsule (40 mg total) by mouth daily.  90 capsule 3  . oxyCODONE-acetaminophen (PERCOCET) 5-325 MG per tablet Take 1-2 tablets by mouth every 4 (four) hours as needed for severe pain. 40 tablet 0  . polyethylene glycol (MIRALAX / GLYCOLAX) packet Take 17 g by mouth daily. 30 each 3  . rosuvastatin (CRESTOR) 10 MG tablet Take 1 tablet (10 mg total) by mouth daily. 90 tablet 3   No current facility-administered medications for this visit.   Family History  Problem Relation Age of Onset  . Coronary artery disease      Strong family history in first degree relatives  . Diabetes Mother     died recently in 02/06/2010 due to 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  She was on dialysis and died with cardiac arrest as per pt.  . Heart disease Father   . Hypertension Father   . Colon cancer Neg Hx    Social History   Social History  . Marital Status: Married    Spouse Name: N/A  . Number of Children: N/A  . Years of Education: N/A   Social History Main Topics  . Smoking status: Never Smoker   . Smokeless tobacco: Never Used  . Alcohol Use: No  . Drug Use: No  . Sexual Activity: Yes    Birth Control/ Protection: Surgical   Other Topics Concern  . None   Social History Narrative  Exercises Regularly, currently takes care of one child.  Lives at home with daughter.   Review of Systems: Review of Systems  Constitutional: Negative for fever and chills.  Respiratory: Positive for shortness of breath and wheezing. Negative for cough.   Cardiovascular: Negative for chest pain.  Neurological: Negative for headaches.     Objective:  Physical Exam: Filed Vitals:   10/06/15 0828  BP: 111/82  Pulse: 88  Temp: 97.7 F (36.5 C)  TempSrc: Oral  Height:  (1.575 m)  Weight: 145 lb 9.6 oz (66.044 kg)  SpO2: 100%  Physical Exam  Constitutional: She is well-developed, well-nourished, and in no distress.  Cardiovascular: Normal rate and regular rhythm.   Pulmonary/Chest: Effort normal and breath sounds normal. She has no wheezes.   Abdominal: Soft. Bowel sounds are normal.  Musculoskeletal:  Left paraspinal ropiness and tenderness at c7-t2  Nursing note and vitals reviewed.   Assessment & Plan:  Case discussed with Dr. 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  She is needing albuterol on a daily basis and sometimes more than once daily.  She only needs when moving and is not being awoken at night with SOB.  A: Exercise induced asthma  P: -Albterol alone does not seem to be effective enough at controlling her symptoms.  I feel she would benefit from a low dose daily inhaled corticosteroid to help her symptoms and decrease albuterol use. - Continue Albuterol PRN - Add Advair daily  Vaginal candidiasis HPI: Reports vaginal itching and white creamy discharge for the last 3 days.  She reports she is rarely sexually active with her husband only.  She does have vaginal dryness and uses premirn sparingly.  A: Vaginal candidiasis  P: Fluconazole  x1  Menopausal syndrome (hot flashes) with mood lability, depression, sleep disturbance, and vaginal dryness  HPI: SHe continues to have some hot flashes. She was taking some nortriptline but does not feel that is effective.  Overall she is most bothered by vaginal dryness and feels that premarin helps.  A: Menopause syndrome  P:- Refill premarin    Hyperlipidemia HPI: Has been taking crestor without issue.  She asks if her cholesterol needs to be rechecked   A: Hyperlipidemia  P: Crestor was effective at reducing her LDL from 182 to 82, I told her we likely do not need to repeat this but certainly do not need to within the year.  Chronic low back pain HPI: She reports chronic low back pain, occasional neck and shoulder pain.  She has been taking naproxen  BID when her pain is bad but does not take this on a regular basis.  She feels  flexeril was very effective for the muscular component of her pain but that it has become less effective lately.  She has been taking at least  once a day of flexeril every day.  She does use heat to the area at night and did have a massage which was very effective.  A: Chronic low back pain and neck pain, muscle spasm  P: - Continue Naproxen  BID - Advised to use flexeril in short burst not as dialy medication - Continue heat - Continue massage if can afford - Introduce stretching program.    Medications Ordered Meds ordered this encounter  Medications  . polyethylene glycol (MIRALAX / GLYCOLAX) packet    Sig: Take 17 g by mouth  daily.    Dispense:  30 each    Refill:  3  . naproxen (NAPROSYN) 500 MG tablet    Sig: Take 1 tablet (500 mg total) by mouth 2 (two) times daily with a meal.    Dispense:  60 tablet    Refill:  5  . mometasone (NASONEX) 50 MCG/ACT nasal spray    Sig: Place 2 sprays into the nose daily.    Dispense:  17 g    Refill:  6  . cyclobenzaprine (FLEXERIL) 10 MG tablet    Sig: Take 1 tablet (10 mg total) by mouth 3 (three) times daily as needed.    Dispense:  90 tablet    Refill:  2  . conjugated estrogens (PREMARIN) vaginal cream    Sig: Place vaginally 2 (two) times a week. 0.5 gram of cream intravaginally administered twice weekly.    Dispense:  30 g    Refill:  2  . Fluticasone-Salmeterol (ADVAIR DISKUS) 100-50 MCG/DOSE AEPB    Sig: Inhale 1 puff into the lungs 2 (two) times daily.    Dispense:  60 each    Refill:  5  . albuterol (PROVENTIL) (2.5 MG/3ML) 0.083% nebulizer solution    Sig: Take 3 mLs (2.5 mg total) by nebulization as directed. USE ONE VIAL IN NEBULIZER EVERY 6 HOURS AS NEEDED FOR WHEEZING OR SHORTNESS OF BREATH    Dispense:  75 vial    Refill:  3  . albuterol (PROVENTIL HFA;VENTOLIN HFA) 108 (90 Base) MCG/ACT inhaler    Sig: Inhale 2 puffs into the lungs 4 (four) times daily as needed for wheezing or shortness of breath.     Dispense:  3 Inhaler    Refill:  3  . fluconazole (DIFLUCAN) 150 MG tablet    Sig: Take 1 tablet (150 mg total) by mouth daily.    Dispense:  1 tablet    Refill:  0   Other Orders No orders of the defined types were placed in this encounter.   Follow Up: Return 9 months.

## 2015-10-11 ENCOUNTER — Telehealth: Payer: Self-pay | Admitting: Internal Medicine

## 2015-10-11 DIAGNOSIS — J45990 Exercise induced bronchospasm: Secondary | ICD-10-CM

## 2015-10-11 NOTE — Telephone Encounter (Signed)
SUPPOSE TO HAVE NEW MEDICATION FOR ASTHMA,COUNTY PHARMACY DOES NOT HAVE FOR HER YEY

## 2015-10-13 ENCOUNTER — Telehealth: Payer: Self-pay | Admitting: Internal Medicine

## 2015-10-13 NOTE — Telephone Encounter (Signed)
Pt states when she was in the office on 1/25, the doctor prescribed her med, but does not know the name. She went to the pharmacy there were no med. Please call pt back.

## 2015-10-13 NOTE — Telephone Encounter (Signed)
Spoke with [facility] at health dept.  New Rx for Advair 100 had to be sent to the company to receive a supply for patient.  They will contact pt when they have medication ready for her.  She did not want rx sent to [facility] or other local pharmacy for a quicker turn around.

## 2015-10-15 NOTE — Progress Notes (Signed)
Medication Samples have been delivered to the patient at their home. The patient is waiting to receive medication from [facility].  Drug name: Dulera 145mcg/5mcg  Qty: 1  LOT: [id number]  Exp.Date: 2018Jan11  The patient has been instructed regarding the correct time, dose, and frequency of taking this medication, including desired effects and most common side effects. The patient was able to repeat back correct use of inhaler including technique and dose.  Briana Parker 4:22 PM 10/15/2015

## 2015-10-18 NOTE — Progress Notes (Signed)
Patient ID: Briana Parker, female   DOB: March 13, 1966, 50 y.o.   MRN: [id number]  Patient was seen in clinic with Lenward Chancellor, PharmD candidate. I agree with the assessment and plan of care documented by [facility].

## 2015-10-19 MED ORDER — MOMETASONE FURO-FORMOTEROL FUM 100-5 MCG/ACT IN AERO: 1.0000 | INHALATION_SPRAY | Freq: Two times a day (BID) | RESPIRATORY_TRACT | Status: DC

## 2015-10-19 NOTE — Telephone Encounter (Signed)
Patient called with questions about nebulizer machine parts and how to obtain them. Provided patient with contact information for [facility]. Patient reports her breathing has improved since starting Dulera (mometasone-formoterol) which was provided as a sample as approved by Dr. Mikey Bussing on 10/15/15 while patient awaits receiving Advair (fluticasone-salmeterol) from [facility]. An additional Rx was sent to [facility] for further [facility] access in case the process for obtaining Advair is delayed.

## 2015-11-16 ENCOUNTER — Ambulatory Visit (INDEPENDENT_AMBULATORY_CARE_PROVIDER_SITE_OTHER): Payer: Self-pay | Admitting: Pharmacist

## 2015-11-16 DIAGNOSIS — Z7189 Other specified counseling: Secondary | ICD-10-CM

## 2015-11-16 DIAGNOSIS — Z79899 Other long term (current) drug therapy: Secondary | ICD-10-CM

## 2015-11-17 ENCOUNTER — Encounter: Payer: Self-pay | Admitting: Pharmacist

## 2015-11-17 ENCOUNTER — Ambulatory Visit: Payer: Self-pay | Admitting: Internal Medicine

## 2015-11-17 NOTE — Progress Notes (Signed)
Dulera provided from [facility] outpatient pharmacy and reviewed with the patient, including name, instructions, indication, goals of therapy, potential side effects, importance of adherence, and safe use.  Contacted [facility] pharmacy to check on status of Advair. Dawn, pharmacist states that all paperwork is complete and they are awaiting shipment from manufacturer. Patient notified.  Patient verbalized understanding by repeating back information and was advised to contact me if further medication-related questions arise. Patient was also provided an information handout.

## 2015-11-17 NOTE — Patient Instructions (Signed)
Patient educated about medication as defined in this encounter and verbalized understanding by repeating back instructions provided.   

## 2015-12-01 ENCOUNTER — Telehealth: Payer: Self-pay | Admitting: Internal Medicine

## 2015-12-01 NOTE — Telephone Encounter (Signed)
Pt calls and states she has had to go back on flexeril, she will pick it up today, she states she went off for a month and just feels so much better when she takes it, she was advised to have an appt but states her car is being worked on and she will need to wait until she has it back

## 2015-12-01 NOTE — Telephone Encounter (Signed)
Ok to use flexeril, hopefully she uses this in short courses not as long term daily medication

## 2015-12-01 NOTE — Telephone Encounter (Signed)
Tried to call pt back, no answer.

## 2015-12-01 NOTE — Telephone Encounter (Signed)
Pt wants to talk to nurse about her muscle relaxer.

## 2015-12-15 ENCOUNTER — Telehealth: Payer: Self-pay | Admitting: Internal Medicine

## 2015-12-15 ENCOUNTER — Emergency Department (HOSPITAL_COMMUNITY): Payer: Self-pay

## 2015-12-15 ENCOUNTER — Ambulatory Visit: Payer: Self-pay | Admitting: Internal Medicine

## 2015-12-15 ENCOUNTER — Emergency Department (HOSPITAL_COMMUNITY)
Admission: EM | Admit: 2015-12-15 | Discharge: 2015-12-15 | Disposition: A | Discharge status: Home / Self Care | Payer: Self-pay | Attending: Emergency Medicine | Admitting: Emergency Medicine

## 2015-12-15 ENCOUNTER — Encounter (HOSPITAL_COMMUNITY): Payer: Self-pay

## 2015-12-15 DIAGNOSIS — Z7951 Long term (current) use of inhaled steroids: Secondary | ICD-10-CM | POA: Insufficient documentation

## 2015-12-15 DIAGNOSIS — E785 Hyperlipidemia, unspecified: Secondary | ICD-10-CM | POA: Insufficient documentation

## 2015-12-15 DIAGNOSIS — R079 Chest pain, unspecified: Secondary | ICD-10-CM | POA: Insufficient documentation

## 2015-12-15 DIAGNOSIS — J441 Chronic obstructive pulmonary disease with (acute) exacerbation: Secondary | ICD-10-CM | POA: Insufficient documentation

## 2015-12-15 DIAGNOSIS — Z8659 Personal history of other mental and behavioral disorders: Secondary | ICD-10-CM | POA: Insufficient documentation

## 2015-12-15 DIAGNOSIS — K219 Gastro-esophageal reflux disease without esophagitis: Secondary | ICD-10-CM | POA: Insufficient documentation

## 2015-12-15 DIAGNOSIS — Z791 Long term (current) use of non-steroidal anti-inflammatories (NSAID): Secondary | ICD-10-CM | POA: Insufficient documentation

## 2015-12-15 DIAGNOSIS — Z79899 Other long term (current) drug therapy: Secondary | ICD-10-CM | POA: Insufficient documentation

## 2015-12-15 DIAGNOSIS — M1990 Unspecified osteoarthritis, unspecified site: Secondary | ICD-10-CM | POA: Insufficient documentation

## 2015-12-15 LAB — CBC
HCT: 39.9 % (ref 36.0–46.0)
Hemoglobin: 12.8 g/dL (ref 12.0–15.0)
MCH: 27.8 pg (ref 26.0–34.0)
MCHC: 32.1 g/dL (ref 30.0–36.0)
MCV: 86.7 fL (ref 78.0–100.0)
PLATELETS: 264 10*3/uL (ref 150–400)
RBC: 4.6 MIL/uL (ref 3.87–5.11)
RDW: 12 % (ref 11.5–15.5)
WBC: 4.4 10*3/uL (ref 4.0–10.5)

## 2015-12-15 LAB — BASIC METABOLIC PANEL
Anion gap: 11 (ref 5–15)
BUN: 13 mg/dL (ref 6–20)
CALCIUM: 9.4 mg/dL (ref 8.9–10.3)
CHLORIDE: 107 mmol/L (ref 101–111)
CO2: 23 mmol/L (ref 22–32)
CREATININE: 0.95 mg/dL (ref 0.44–1.00)
GFR calc non Af Amer: >60 mL/min (ref >60)
Glucose, Bld: 108 mg/dL — ABNORMAL HIGH (ref 65–99)
Potassium: 4 mmol/L (ref 3.5–5.1)
Sodium: 141 mmol/L (ref 135–145)

## 2015-12-15 LAB — I-STAT TROPONIN, ED
TROPONIN I, POC: 0 ng/mL (ref 0.00–0.08)
Troponin i, poc: 0 ng/mL (ref 0.00–0.08)

## 2015-12-15 MED ORDER — DEXAMETHASONE 4 MG PO TABS
10.0000 mg | ORAL_TABLET | Freq: Once | ORAL | Status: AC
  Administered 2015-12-15: 10 mg via ORAL
  Filled 2015-12-15: qty 3

## 2015-12-15 MED ORDER — IPRATROPIUM-ALBUTEROL 0.5-2.5 (3) MG/3ML IN SOLN
3.0000 mL | Freq: Once | RESPIRATORY_TRACT | Status: AC
  Administered 2015-12-15: 3 mL via RESPIRATORY_TRACT
  Filled 2015-12-15: qty 3

## 2015-12-15 NOTE — Telephone Encounter (Signed)
Pt wants to talk to nurse °

## 2015-12-15 NOTE — ED Notes (Signed)
Patient here with chest pain and tightness x 3 days. Reports that she thinks it is related to her inhaler, no distress.

## 2015-12-15 NOTE — ED Provider Notes (Signed)
CSN: [id number]     Arrival date & time 12/15/15  1103 History   First MD Initiated Contact with Patient 12/15/15 1353     Chief Complaint  Patient presents with  . Chest Pain     (Consider location/radiation/quality/duration/timing/severity/associated sxs/prior Treatment) HPI Comments: 50yo F w/ PMH including HLD, asthma, anxiety/depression, GERD, fibromyalgia who p/w chest pain. Patient reports a five-day history of central chest pain that was initially intermittent but has recently been constant. She feels that the pain as a tightness and has been associated with shortness of breath. She started coughing today and feels like she is wheezing when she coughs. She denies any fevers. No abdominal pain or vomiting. No other cold symptoms. She was started on a new inhaler recently and she is concerned that it may have caused her symptoms. She denies any recent travel, history of cancer, or history of blood clots. Family history negative for heart disease. She does not smoke.  Patient is a 50 y.o. female presenting with chest pain. The history is provided by the patient.  Chest Pain   Past Medical History  Diagnosis Date  . Fibromyalgia   . Hyperlipidemia   . Low back pain   . Allergic rhinitis   . Allergy     seasonal  . Arthritis     back  . Chronic bronchitis (HCC)   . GERD (gastroesophageal reflux disease)   . COPD (chronic obstructive pulmonary disease) (HCC)   . Anxiety   . Depression    Past Surgical History  Procedure Laterality Date  . Abdominal hysterectomy    . Wisdom tooth extraction    . Carpal tunnel release Right 05/05/2015    Procedure: RIGHT CARPAL TUNNEL RELEASE;  Surgeon: Tarry KosNaiping M Xu, MD;  Location: [facility];  Service: Orthopedics;  Laterality: Right;  . Trigger finger release Left 05/05/2015    Procedure: LEFT TRIGGER THUMB RELEASE;  Surgeon: Tarry KosNaiping M Xu, MD;  Location: [facility];  Service: Orthopedics;  Laterality: Left;    Family History  Problem Relation Age of Onset  . Coronary artery disease      Strong family history in first degree relatives  . Diabetes Mother     died recently in 01/2010 due to 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  She was on dialysis and died with cardiac arrest as per pt.  . Heart disease Father   . Hypertension Father   . Colon cancer Neg Hx    Social History  Substance Use Topics  . Smoking status: Never Smoker   . Smokeless tobacco: Never Used  . Alcohol Use: No   OB History    No data available     Review of Systems  Cardiovascular: Positive for chest pain.   10 Systems reviewed and are negative for acute change except as noted in the HPI.   Allergies  Augmentin; Codeine; Hydrocodone; Pravastatin sodium; and Simvastatin  Home Medications   Prior to Admission medications   Medication Sig Start Date End Date Taking? Authorizing Provider  albuterol (PROVENTIL HFA;VENTOLIN HFA) 108 (90 Base) MCG/ACT inhaler Inhale 2 puffs into the lungs 4 (four) times daily as needed for wheezing or shortness of breath. 10/06/15   Gust RungErik C Hoffman, DO  albuterol (PROVENTIL) (2.5 MG/3ML) 0.083% nebulizer solution Take 3 mLs (2.5 mg total) by nebulization as directed. USE ONE VIAL IN NEBULIZER EVERY 6 HOURS AS NEEDED FOR WHEEZING OR SHORTNESS OF BREATH 10/06/15   Gust RungErik C Hoffman, DO  calcium-vitamin D [facility](OSCAL  WITH D) 500-200 MG-UNIT per tablet Take 1 tablet by mouth daily with breakfast.    Historical Provider, MD  conjugated estrogens (PREMARIN) vaginal cream Place vaginally 2 (two) times a week. 0.5 gram of cream intravaginally administered twice weekly. 10/06/15   Gust Rung, DO  cyclobenzaprine (FLEXERIL) 10 MG tablet Take 1 tablet (10 mg total) by mouth 3 (three) times daily as needed. 10/06/15   Gust Rung, DO  fluconazole (DIFLUCAN) 150 MG tablet Take 1 tablet (150 mg total) by mouth daily. 10/06/15   Gust Rung, DO  Fluticasone-Salmeterol (ADVAIR DISKUS) 100-50 MCG/DOSE AEPB Inhale 1 puff  into the lungs 2 (two) times daily. 10/06/15 10/05/16  Gust Rung, DO  mometasone (NASONEX) 50 MCG/ACT nasal spray Place 2 sprays into the nose daily. 10/06/15 10/02/16  Gust Rung, DO  Multiple Vitamins-Minerals (MULTIVITAMIN ADULTS 50+ PO) Take by mouth.    Historical Provider, MD  naproxen (NAPROSYN) 500 MG tablet Take 1 tablet (500 mg total) by mouth 2 (two) times daily with a meal. 10/06/15   Gust Rung, DO  omeprazole (PRILOSEC) 40 MG capsule Take 1 capsule (40 mg total) by mouth daily. 02/11/15 02/11/16  Gust Rung, DO  polyethylene glycol (MIRALAX / GLYCOLAX) packet Take 17 g by mouth daily. 10/06/15   Gust Rung, DO  rosuvastatin (CRESTOR) 10 MG tablet Take 1 tablet (10 mg total) by mouth daily. 02/11/15   Gust Rung, DO   BP 122/66 mmHg  Pulse 88  Temp(Src) 98.6 F (37 C) (Oral)  Resp 23  Ht  (1.575 m)  Wt 149 lb (67.586 kg)  BMI 27.25 kg/m2  SpO2 100% Physical Exam  Constitutional: She is oriented to person, place, and time. She appears well-developed and well-nourished. No distress.  HENT:  Head: Normocephalic and atraumatic.  Moist mucous membranes  Eyes: Conjunctivae are normal. Pupils are equal, round, and reactive to light.  Neck: Neck supple.  Cardiovascular: Normal rate, regular rhythm and normal heart sounds.   No murmur heard. Pulmonary/Chest: Effort normal.  Frequent cough, mildly diminished bases  Abdominal: Soft. Bowel sounds are normal. She exhibits no distension. There is no tenderness.  Musculoskeletal: She exhibits no edema.  Neurological: She is alert and oriented to person, place, and time.  Fluent speech  Skin: Skin is warm and dry.  Psychiatric: She has a normal mood and affect. Judgment normal.  Nursing note and vitals reviewed.   ED Course  Procedures (including critical care time) Labs Review Labs Reviewed  BASIC METABOLIC PANEL - Abnormal; Notable for the following:    Glucose, Bld 108 (*)    All other components within  normal limits  CBC  I-STAT TROPOININ, ED  Rosezena Sensor, ED    Imaging Review Dg Chest 2 View  12/15/2015  CLINICAL DATA:  50 year old female with left chest pain for 3 days. Initial encounter. EXAM: CHEST  2 VIEW COMPARISON:  09/11/2005 FINDINGS: Lower lung volumes with crowding of markings at the lung bases. Mediastinal contours remain normal. Visualized tracheal air column is within normal limits. No pneumothorax. No pleural effusion, or confluent pulmonary opacity. There are increased interstitial markings. These do not appear related to pulmonary edema. No acute osseous abnormality identified. IMPRESSION: Lower lung volumes. Increased interstitial markings could simply reflect atelectasis, or viral/atypical respiratory infection. Electronically Signed   By: Odessa Fleming M.D.   On: 12/15/2015 11:51   I have personally reviewed and evaluated these lab results as part of my medical  decision-making.   EKG Interpretation   Date/Time:  Wednesday December 15 2015 11:10:31 EDT Ventricular Rate:  117 PR Interval:  162 QRS Duration: 74 QT Interval:  310 QTC Calculation: 432 R Axis:   65 Text Interpretation:  Sinus tachycardia with Premature atrial complexes  Possible Left atrial enlargement Borderline ECG sinus arrythmia new from  previous Confirmed by Kadeen Sroka MD, Tahjanae Blankenburg [phone]) on 12/15/2015 1:54:36 PM     Medications  ipratropium-albuterol (DUONEB) 0.5-2.5 (3) MG/3ML nebulizer solution 3 mL (3 mLs Nebulization Given 12/15/15 1521)  dexamethasone (DECADRON) tablet 10 mg (10 mg Oral Given 12/15/15 1632)    MDM   Final diagnoses:  Chest pain, unspecified chest pain type   Pt w/ 5d of intermittent now constant chest tightness associated w/ SOB and cough that began today. On exam, she was well-appearing with normal work of breathing, frequent dry cough. Vital signs stable. She had mildly diminished breath sounds bilaterally, no obvious wheezing. EKG showed sinus tachycardia, sinus arrhythmia, no  ischemic changes. Obtained above lab work including serial troponins which were negative. Gave the patient a DuoNeb and on reexamination, she is moving air well and continues to have frequent cough. Occasional faint upper lobe wheeze. Chest x-ray shows increased interstitial markings, possible viral infection, no infiltrate.   I considered PE but given association w/ cough and pt endorsing associated wheezing, I suspect reactive airways in setting of allergies is more likely. Gave the patient Decadron. Her HEART score is 3 or 4 feel she is safe for discharge home with PCP follow-up regarding her chest pain. I have reviewed supportive care instructions including continuing her inhalers and I have extensively reviewed return precautions including change in chest pain, worsening pain, or worsening shortness of breath. Patient voiced understanding and was discharged in satisfactory condition.   Laurence Spates, MD 12/15/15 [phone]

## 2015-12-15 NOTE — Telephone Encounter (Signed)
Called pt, did not realize she was given an appt this am, triage suggested she go to urg care or ED, states for appr 5 days she has chest pain at intervals radiates to arm, she has weakness, generalized when this chest pain comes also has shortness of breath, also has wheezes, she states it is getting worse, she is advised to go to urg care and call when she is seen there, she is agreeable, i will not cancel appt until she calls

## 2015-12-16 NOTE — Telephone Encounter (Signed)
Pt calling nurse back °

## 2015-12-16 NOTE — Telephone Encounter (Signed)
Pt went to ED

## 2015-12-28 ENCOUNTER — Ambulatory Visit: Payer: Self-pay | Admitting: Internal Medicine

## 2016-01-12 ENCOUNTER — Ambulatory Visit (INDEPENDENT_AMBULATORY_CARE_PROVIDER_SITE_OTHER): Payer: Self-pay | Admitting: Internal Medicine

## 2016-01-12 ENCOUNTER — Encounter: Payer: Self-pay | Admitting: Internal Medicine

## 2016-01-12 VITALS — BP 121/78 | HR 92 | Temp 98.5°F | Wt 147.0 lb

## 2016-01-12 DIAGNOSIS — R4586 Emotional lability: Secondary | ICD-10-CM

## 2016-01-12 DIAGNOSIS — K0889 Other specified disorders of teeth and supporting structures: Secondary | ICD-10-CM

## 2016-01-12 DIAGNOSIS — G479 Sleep disorder, unspecified: Secondary | ICD-10-CM

## 2016-01-12 DIAGNOSIS — M545 Low back pain: Secondary | ICD-10-CM

## 2016-01-12 DIAGNOSIS — J45909 Unspecified asthma, uncomplicated: Secondary | ICD-10-CM

## 2016-01-12 DIAGNOSIS — G8929 Other chronic pain: Secondary | ICD-10-CM

## 2016-01-12 DIAGNOSIS — R0789 Other chest pain: Secondary | ICD-10-CM

## 2016-01-12 DIAGNOSIS — E785 Hyperlipidemia, unspecified: Secondary | ICD-10-CM

## 2016-01-12 DIAGNOSIS — N951 Menopausal and female climacteric states: Secondary | ICD-10-CM

## 2016-01-12 DIAGNOSIS — F329 Major depressive disorder, single episode, unspecified: Secondary | ICD-10-CM

## 2016-01-12 DIAGNOSIS — H538 Other visual disturbances: Secondary | ICD-10-CM

## 2016-01-12 DIAGNOSIS — N898 Other specified noninflammatory disorders of vagina: Secondary | ICD-10-CM

## 2016-01-12 MED ORDER — ROSUVASTATIN CALCIUM 10 MG PO TABS: 10.0000 mg | ORAL_TABLET | Freq: Every day | ORAL | Status: DC

## 2016-01-12 MED ORDER — CYCLOBENZAPRINE HCL 10 MG PO TABS: 10.0000 mg | ORAL_TABLET | Freq: Three times a day (TID) | ORAL | Status: DC | PRN

## 2016-01-12 MED ORDER — CITALOPRAM HYDROBROMIDE 10 MG PO TABS
10.0000 mg | ORAL_TABLET | Freq: Every day | ORAL | Status: DC
Start: 2016-01-12 — End: 2016-04-28

## 2016-01-12 NOTE — Assessment & Plan Note (Addendum)
Evaluated in ED on 12/15/15 for chest pain that seems asthma related. Trops negative and EKG normal. She is no longer having chest pain and asthma controlled on Advair and Albuterol.

## 2016-01-12 NOTE — Patient Instructions (Signed)
   General Instructions: - Start Celexa 10 mg daily. If your symptoms are not controlled in the next 4 weeks we can increase to 20 mg daily. - Refills made on Crestor and Flexeril  Please bring your medicines with you each time you come to clinic.  Medicines may include prescription medications, over-the-counter medications, herbal remedies, eye drops, vitamins, or other pills.   Progress Toward Treatment Goals:  No flowsheet data found.  Self Care Goals & Plans:  No flowsheet data found.  No flowsheet data found.   Care Management & Community Referrals:  No flowsheet data found.

## 2016-01-12 NOTE — Assessment & Plan Note (Signed)
Refilled Flexeril, ok per Dr. Neita GarnetHoffman's telephone note. Emphasized to only take as needed, not an every day medication.

## 2016-01-12 NOTE — Assessment & Plan Note (Signed)
Will start patient on Celexa 10 mg daily. Can titrate up to 20 mg daily if symptoms not controlled in the next 4 weeks. We discussed the utility of checking a FSH level and that I did not feel it was necessary to order. However, the patient was adamant that she wanted this test done. Will have her follow up in 6 weeks to reassess symptoms.

## 2016-01-12 NOTE — Progress Notes (Signed)
Internal Medicine Clinic Attending  Case discussed with Dr. Rivet at the time of the visit.  We reviewed the resident's history and exam and pertinent patient test results.  I agree with the assessment, diagnosis, and plan of care documented in the resident's note.  

## 2016-01-12 NOTE — Progress Notes (Signed)
   Subjective:    Patient ID: Briana Parker, female    DOB: 11/05/1965, 50 y.o.   MRN: [id number]  HPI [name] is a 50yo woman with PMHx of asthma, fibromyalgia, hyperlipidemia, and allergic rhinitis who presents today for follow up on her hot flashes.  Hot Flashes: She reports she had a hysterectomy in 2006 (per records this was ovary sparing) and thus her LMP was at that time. She reports her hot flashes cause excessive sweating, mood swings, and poor sleep. She notes her mood swings are not as bad as before, but still occur. She is able to do all the activities she likes to do, but notes she is uncomfortable. She reports having to pour water over herself at times. She tries to wear loose-fitting clothing and keeps the fans in her house on all the time. She has tried Effexor and Nortriptyline with no relief.    ED Follow up: She was evaluated in the ED on 12/15/15 for a 5 day hx of chest pain. At that time she had described the pain as tightness, constant, and associated with SOB and wheezing. Troponins were negative and EKG showed NSR. Her chest pain and associated symptoms were attributed to reactive airway disease. Patient reports today the pain last month felt sharp and like "something coming up my lungs." She has not had any reoccurrences of the pain. She reports she has been using her Advair twice daily and albuterol nebs as needed.    Review of Systems General: Denies fever, chills, night sweats, changes in weight, changes in appetite HEENT: Denies headaches, ear pain, changes in vision, rhinorrhea, sore throat CV: Denies palpitations, orthopnea Pulm: Denies cough GI: Denies abdominal pain, nausea, vomiting, diarrhea, constipation, melena, hematochezia GU: Denies dysuria, hematuria, frequency Msk: Denies muscle cramps, joint pains Neuro: Denies weakness, numbness, tingling Skin: Denies rashes, bruising Psych: Denies depression, anxiety, hallucinations    Objective:   Physical  Exam General: alert, sitting up, NAD HEENT: [place]/AT, EOMI, sclera anicteric, mucus membranes moist CV: RRR, no m/g/r Pulm: CTA bilaterally, breaths non-labored Abd: BS+, soft, non-tender Ext: warm, no peripheral edema Neuro: alert and oriented x 3    Assessment & Plan:  Please refer to A&P documentation.

## 2016-01-12 NOTE — Assessment & Plan Note (Signed)
Refilled Crestor. 

## 2016-01-13 ENCOUNTER — Telehealth: Payer: Self-pay | Admitting: *Deleted

## 2016-01-13 LAB — FOLLICLE STIMULATING HORMONE: FSH: 111.4 m[IU]/mL

## 2016-01-13 NOTE — Telephone Encounter (Signed)
CALLED PATIENT REGARDING HER EYE / DENTAL REFERRAL. DR HOFFMAN WANTS TO KNOW HER REASON FOR THE REQUEST. PATIENT IS P4. LEFT MESSAGE FOR PATIENT TO RETURN MY CALL.

## 2016-01-17 DIAGNOSIS — K0889 Other specified disorders of teeth and supporting structures: Secondary | ICD-10-CM | POA: Insufficient documentation

## 2016-01-17 DIAGNOSIS — H538 Other visual disturbances: Secondary | ICD-10-CM | POA: Insufficient documentation

## 2016-01-17 NOTE — Addendum Note (Signed)
Addended by: Gust RungHOFFMAN, ERIK C on: 01/17/2016 08:14 PM   Modules accepted: Orders

## 2016-01-17 NOTE — Assessment & Plan Note (Signed)
After her visit with Dr Beckie Saltsivet, Briana Parker c/o bottom left dental pain to my nurse Lela and requested referral for dental evaluation.  She does have a history of dental carries and I will place referral for dental evaluation.

## 2016-01-17 NOTE — Assessment & Plan Note (Signed)
After her visit with Dr Beckie Saltsivet, Ms Briana Parker c/o decreased visual acuity when reading and dry eyes to my nurse Lela, she is requested a referral to optometry. -Placed referral to optometry

## 2016-01-24 ENCOUNTER — Telehealth: Payer: Self-pay | Admitting: Internal Medicine

## 2016-01-24 NOTE — Telephone Encounter (Signed)
Discussed lab results with patient. 

## 2016-01-24 NOTE — Telephone Encounter (Signed)
Patient called regarding recent start of Celexa. Reports no improvement in menopause symptoms after 1 week. Per your patient instructions from her visit, I encouraged her continue the current 10 mg. Dose for at least another week and possibly another 3 weeks and call back if symptoms still have not improved. Educated patient that this type of medication takes some time to achieve maximum effect.   Also wanting you to call with TSH results.   Thanks!

## 2016-01-24 NOTE — Telephone Encounter (Signed)
Needs to speak to nurse °

## 2016-02-08 ENCOUNTER — Other Ambulatory Visit: Payer: Self-pay | Admitting: Internal Medicine

## 2016-02-08 MED ORDER — OMEPRAZOLE 40 MG PO CPDR: 40.0000 mg | DELAYED_RELEASE_CAPSULE | Freq: Every day | ORAL | Status: DC

## 2016-02-08 NOTE — Telephone Encounter (Signed)
omeprazole (PRILOSEC) 40 MG capsule [place]

## 2016-02-14 ENCOUNTER — Other Ambulatory Visit: Payer: Self-pay

## 2016-02-14 DIAGNOSIS — G8929 Other chronic pain: Secondary | ICD-10-CM

## 2016-02-14 DIAGNOSIS — M545 Low back pain, unspecified: Secondary | ICD-10-CM

## 2016-02-14 MED ORDER — NAPROXEN 500 MG PO TABS: 500.0000 mg | ORAL_TABLET | Freq: Two times a day (BID) | ORAL | Status: DC

## 2016-02-14 NOTE — Telephone Encounter (Signed)
Pt requesting naproxen [facility] be [facility]

## 2016-02-14 NOTE — Telephone Encounter (Signed)
Needs Oct / Nov PCP appt

## 2016-02-29 ENCOUNTER — Encounter: Payer: Self-pay | Admitting: *Deleted

## 2016-02-29 NOTE — Telephone Encounter (Signed)
Called [facility], state they can not find script, gave verbally, tried to inform pt call disconnected, no naswer when called back

## 2016-03-08 ENCOUNTER — Encounter: Payer: Self-pay | Admitting: Family Medicine

## 2016-03-08 ENCOUNTER — Ambulatory Visit (INDEPENDENT_AMBULATORY_CARE_PROVIDER_SITE_OTHER): Payer: Self-pay | Admitting: Family Medicine

## 2016-03-08 VITALS — BP 119/63 | HR 97 | Ht 62.0 in | Wt 147.0 lb

## 2016-03-08 DIAGNOSIS — G5603 Carpal tunnel syndrome, bilateral upper limbs: Secondary | ICD-10-CM

## 2016-03-08 DIAGNOSIS — M6530 Trigger finger, unspecified finger: Secondary | ICD-10-CM

## 2016-03-08 MED ORDER — METHYLPREDNISOLONE ACETATE 40 MG/ML IJ SUSP
40.0000 mg | Freq: Once | INTRAMUSCULAR | Status: AC
  Administered 2016-03-08: 40 mg via INTRA_ARTICULAR

## 2016-03-08 NOTE — Progress Notes (Signed)
  Briana Parker - 50 y.o. female MRN [id number]  Date of birth: 09/13/1965 Briana Parker is a 50 y.o. female who presents today for bilateral hand numbness along with triggering of the left thumb.  Bilateral hand numbness, initial visit-patient has been having ongoing hand numbness for several years now. She has been previously diagnosed with carpal tunnel syndrome without having formal electrodiagnostic testing. The tingling and numbness is present in the first 3 digits on both hands. 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 She does endorse night time awakenings with pain especially in the right hand. She started using a wrist cockup splints in March 2016, and she has not noticed too much of a difference. She denies frank weakness or grip strength weakness in either hand. She has never had the area injected.  She does endorse a positive Flick sign with the right hand being worse than the left.  F/u visit 08/11/15 - patient had EMG done confirming moderate bilateral carpal tunnel with right greater than left done in August 2016 at [facility]. She eventually had a carpal tunnel release on the right done on 05/05/2015. She continues to have problems with the left one despite cock-up splint and activity modification.  F/u 03/08/16-patient starting to have ongoing paresthesias on the left hand again especially at night. Positive Flick sign.  Right thumb trigger finger, initial visit - patient endorsing triggering of the right thumb which is been ongoing now for 3-4 months. It is worse at the end of the day, and she has noticed that the locking has progressed. There is a palpable nodule and is now painful in the metacarpal region. She has tried taping the area which has helped somewhat. Other than that she has not tried any treatment. Did previously had left trigger finger injection and diagnosis with recalcitrant problems  in which she had surgery done for A1 pulley release on 05/05/2015.  Right thumb trigger finger 02/17/16-patient starting no triggering in her right thumb again. Previous injection worked for about 4-6 months now. Denies any new symptoms.  PMHx - Updated and reviewed.  Contributory factors include: Asthma PSHx - Updated and reviewed.  Contributory factors include: Noncontributory FHx - Updated and reviewed.  Contributory factors include:  Noncontributory Medications - Naprosyn 500 mg twice a day   ROS Per HPI   Exam:  Filed Vitals:   03/08/16 1441  BP: 119/63  Pulse: 97   Gen: NAD Cardiorespiratory - Normal respiratory effort/rate.  RRR Wrist: Inspection normal with no visible erythema or swelling.  Healed incision on R  ROM smooth and normal with good flexion and extension and ulnar/radial deviation that is symmetrical with opposite wrist. Palpation is normal over metacarpals, navicular, lunate, and TFCC; tendons without tenderness/ swelling Strength 5/5 in all directions without pain. Negative Finkelstein Positive flick, tinel's and phalens. Positive tenderness to palpation with palpable nodule at the A1 pulley metacarpal head on the R palmar region of the thumb. There is also palpatory triggering of the digit. Neurovascularly intact bilateral

## 2016-03-08 NOTE — Assessment & Plan Note (Signed)
CTS hydrodissection today.  CTS causing Sx despite wrist splints.    Aspiration/Injection Procedure Note Briana Parker 07/14/1966  Procedure: Injection Indications: L CTS   Procedure Details Consent: Risks of procedure as well as the alternatives and risks of each were explained to the (patient/caregiver).  Consent for procedure obtained. Time Out: Verified patient identification, verified procedure, site/side was marked, verified correct patient position, special equipment/implants available, medications/allergies/relevent history reviewed, required imaging and test results available.  Performed.  The area was cleaned with iodine and alcohol swabs.    The L CT was injected using 1.5 cc's of 40mg  Depomedrol and 1.5 cc's of 1% lidocaine with a 21 1 1/2" needle.  Ultrasound was used. Images were obtained in Transverse and Long views showing the injection.     Patient did tolerate procedure well. Estimated blood loss: None  F/U PRN

## 2016-03-08 NOTE — Assessment & Plan Note (Signed)
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 -Ultrasound-guided injection to the flexor pollicis longus tendon sheath and A1 pulley region with the visual distention underneath the A1 pulley. -Discussed etiology and nature of this including splinting and injection that have a 50% chance of working past 1 year. Consider trigger finger release by a hand surgeon.   Aspiration/Injection Procedure Note Briana Parker 06/16/1966  Procedure: Injection Indications: trigger  Procedure Details Consent: Risks of procedure as well as the alternatives and risks of each were explained to the (patient/caregiver).  Consent for procedure obtained. verbal and she understands risk of atrophy, pigmentation, or numbness.  Time Out: Verified patient identification, verified procedure, site/side was marked, verified correct patient position, special equipment/implants available, medications/allergies/relevent history reviewed, required imaging and test results available.  Performed.  The area was cleaned with iodine and alcohol swabs.    The R trigger thumb was injected using 0.5 cc's of 40 Depomedrol and 0.5 cc's of 1% lidocaine with a 27 1 1/2" needle.  Ultrasound was used. Images were obtained in Transverse and Long views showing the injection.    A sterile dressing was applied.  Patient did tolerate procedure well. Estimated blood loss: none

## 2016-03-08 NOTE — Addendum Note (Signed)
Addended byFrankey Poot: Briana Parker N on: 03/08/2016 03:46 PM   Modules accepted: Orders

## 2016-03-21 ENCOUNTER — Encounter: Payer: Self-pay | Admitting: Internal Medicine

## 2016-03-22 ENCOUNTER — Ambulatory Visit: Payer: Self-pay | Admitting: Pharmacist

## 2016-03-22 ENCOUNTER — Ambulatory Visit: Payer: Self-pay

## 2016-03-22 DIAGNOSIS — Z596 Low income: Secondary | ICD-10-CM

## 2016-03-22 DIAGNOSIS — Z79899 Other long term (current) drug therapy: Secondary | ICD-10-CM

## 2016-03-22 NOTE — Progress Notes (Signed)
Patient reports for help accessing inhalers. No signs/symptoms of concern today.  Medication Samples have been provided to the patient.  Drug name: albuterol (Proventil)       Strength: 90 mcg/puff        Qty: 1  LOT: [id number]  Exp.Date: 11/2017 Dosing instructions: 2 puffs QID PRN shortness of breath  Drug name: mometasone-formoterol [name](Dulera)       Strength: 100/5 mcg        Qty: 1  LOT: [id number]  Exp.Date: 09/21/16 Dosing instructions: 1 puff BID  The patient has been instructed regarding the correct time, dose, and frequency of taking this medication, including desired effects and most common side effects. [facility] and financial counselor for ongoing access.  Briana Parker J 10:38 AM 03/22/2016

## 2016-04-18 ENCOUNTER — Telehealth: Payer: Self-pay | Admitting: *Deleted

## 2016-04-21 ENCOUNTER — Other Ambulatory Visit: Payer: Self-pay | Admitting: *Deleted

## 2016-04-21 NOTE — Telephone Encounter (Signed)
Pharm given refill from may

## 2016-04-28 ENCOUNTER — Other Ambulatory Visit: Payer: Self-pay

## 2016-04-28 ENCOUNTER — Other Ambulatory Visit: Payer: Self-pay | Admitting: Pharmacist

## 2016-04-28 DIAGNOSIS — J45990 Exercise induced bronchospasm: Secondary | ICD-10-CM

## 2016-04-28 DIAGNOSIS — E785 Hyperlipidemia, unspecified: Secondary | ICD-10-CM

## 2016-04-28 MED ORDER — ROSUVASTATIN CALCIUM 10 MG PO TABS: 10.0000 mg | ORAL_TABLET | Freq: Every day | ORAL | 3 refills | Status: DC

## 2016-04-28 MED ORDER — ALBUTEROL SULFATE (2.5 MG/3ML) 0.083% IN NEBU: 2.5000 mg | INHALATION_SOLUTION | RESPIRATORY_TRACT | 3 refills | Status: DC

## 2016-04-28 MED ORDER — CITALOPRAM HYDROBROMIDE 10 MG PO TABS: 10.0000 mg | ORAL_TABLET | Freq: Every day | ORAL | 2 refills | Status: DC

## 2016-04-28 MED ORDER — MOMETASONE FURO-FORMOTEROL FUM 100-5 MCG/ACT IN AERO: 1.0000 | INHALATION_SPRAY | Freq: Two times a day (BID) | RESPIRATORY_TRACT | 11 refills | Status: DC

## 2016-04-28 NOTE — Telephone Encounter (Signed)
Needs to speak with a nurse regarding citalopram.

## 2016-04-28 NOTE — Progress Notes (Signed)
Patient stated she prefers Dulera inhaler rather than Advair. [facility]

## 2016-04-28 NOTE — Telephone Encounter (Signed)
Patient states that [facility] does not have any more refills of Crestor or Celexa. Attempted to call [facility] with no answer. Unable to leave message.

## 2016-04-28 NOTE — Telephone Encounter (Signed)
Pharmacy did not receive last RX for Crestor. Could you reorder? Patient almost out of Celexa. Thanks!

## 2016-05-08 ENCOUNTER — Telehealth: Payer: Self-pay | Admitting: *Deleted

## 2016-05-16 ENCOUNTER — Ambulatory Visit (INDEPENDENT_AMBULATORY_CARE_PROVIDER_SITE_OTHER): Payer: Self-pay | Admitting: *Deleted

## 2016-05-16 DIAGNOSIS — Z23 Encounter for immunization: Secondary | ICD-10-CM

## 2016-05-16 DIAGNOSIS — J45990 Exercise induced bronchospasm: Secondary | ICD-10-CM

## 2016-05-16 DIAGNOSIS — E785 Hyperlipidemia, unspecified: Secondary | ICD-10-CM

## 2016-05-16 MED ORDER — BUDESONIDE-FORMOTEROL FUMARATE 80-4.5 MCG/ACT IN AERO: 2.0000 | INHALATION_SPRAY | Freq: Two times a day (BID) | RESPIRATORY_TRACT | 12 refills | Status: DC

## 2016-05-16 MED ORDER — ROSUVASTATIN CALCIUM 10 MG PO TABS: 10.0000 mg | ORAL_TABLET | Freq: Every day | ORAL | 3 refills | Status: DC

## 2016-05-16 NOTE — Progress Notes (Signed)
Medication Samples have been provided to the patient.  Drug name: Elwin SleightDulera       Strength: 100/5 mcg        Qty: 2  LOT: [id number]  Exp.Date: 09/21/16  Dosing instructions: 1 puff BID  Drug: Nexium Strength: 20 Qty: 20 LOT: [id number] Exp.Date: 2/19 Dosing instructions: 1 capsule daily  The patient has been instructed regarding the correct time, dose, and frequency of taking this medication, including desired effects and most common side effects.   Kim,Jennifer J 11:40 AM 05/16/2016

## 2016-05-16 NOTE — Addendum Note (Signed)
Addended by: Mliss FritzKIM, JENNIFER J on: 05/16/2016 11:50 AM   Modules accepted: Orders

## 2016-05-17 MED ORDER — MOMETASONE FURO-FORMOTEROL FUM 100-5 MCG/ACT IN AERO: 1.0000 | INHALATION_SPRAY | Freq: Two times a day (BID) | RESPIRATORY_TRACT | 11 refills | Status: DC

## 2016-05-17 NOTE — Addendum Note (Signed)
Addended by: Mliss FritzKIM, JENNIFER J on: 05/17/2016 09:01 AM   Modules accepted: Orders

## 2016-06-20 ENCOUNTER — Ambulatory Visit (INDEPENDENT_AMBULATORY_CARE_PROVIDER_SITE_OTHER): Payer: Self-pay | Admitting: Family Medicine

## 2016-06-20 ENCOUNTER — Encounter: Payer: Self-pay | Admitting: Family Medicine

## 2016-06-20 DIAGNOSIS — G5602 Carpal tunnel syndrome, left upper limb: Secondary | ICD-10-CM

## 2016-06-20 NOTE — Assessment & Plan Note (Signed)
Unsure how much improvement she had was injection this past June. She does not want to try prednisone and does not have time for physical therapy. - Provided home modalities sheet - Had the discussion that she would most likely benefit from surgery at this point. She will think about it and call us back about her decision.

## 2016-06-20 NOTE — Progress Notes (Signed)
  Briana Parker - 50 y.o. female MRN [id number]  Date of birth: 09/18/1965  SUBJECTIVE:  Including CC & ROS.  Chief Complaint  Patient presents with  . Hand Pain     [name] is a 50 year old female that is presenting with left wrist pain. She has a history of carpal tunnel syndrome of the left wrist. An EMG done in August 2016 showed moderate bilateral carpal tunnel syndrome with right worse than left. She has had a carpal tunnel release performed on the right wrist. She is wanting to avoid a surgery on the left. 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 She received a median nerve hydrodissection and injection in June 2017. She is unsure of how much time this improved her symptoms. She reports her symptoms were exacerbated when she was picking and cleaning collard greens roughly 3 weeks ago.  ROS: No unexpected weight loss, fever, chills, swelling, instability, redness, otherwise see HPI   HISTORY: Past Medical, Surgical, Social, and Family History Reviewed & Updated per EMR.   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  Psych:  alert and oriented Hand and Wrist:  No signs of atrophy of the thenar or hypothenar eminence Normal wrist range of motion in flexion, extension, ulnar and radial deviation. No finger misalignment or malrotation Normal thumb opposition. Normal finger abduction and abduction. Normal grip strength. Positive  Tinel's. Negative Finkelstein's test. Neurovascularly intact   ASSESSMENT & PLAN:   Carpal tunnel syndrome Unsure how much improvement she had was injection this past June. She does not want to try prednisone and does not have time for physical therapy. - Provided home modalities sheet - Had the discussion that she would most likely benefit from surgery at this point. She will think about it and call us back about her decision.

## 2016-06-21 ENCOUNTER — Ambulatory Visit: Payer: Self-pay | Admitting: Student

## 2016-06-27 ENCOUNTER — Other Ambulatory Visit: Payer: Self-pay | Admitting: Family Medicine

## 2016-06-27 MED ORDER — DICLOFENAC SODIUM 1 % TD GEL: 2.0000 g | Freq: Four times a day (QID) | TRANSDERMAL | 0 refills | Status: DC

## 2016-06-27 NOTE — Progress Notes (Signed)
Will try Voltaren gel for her symptoms. She has a history of CTS so unsure how much it will help.   Myra RudeJeremy E Hillary Struss, MD PGY-4, [facility] 06/27/2016, 12:02 PM

## 2016-07-10 ENCOUNTER — Other Ambulatory Visit: Payer: Self-pay | Admitting: *Deleted

## 2016-07-10 MED ORDER — DICLOFENAC SODIUM 2 % TD SOLN: TRANSDERMAL | 3 refills | Status: DC

## 2016-07-13 ENCOUNTER — Telehealth: Payer: Self-pay | Admitting: *Deleted

## 2016-07-13 MED ORDER — ROSUVASTATIN CALCIUM 10 MG PO TABS: 10.0000 mg | ORAL_TABLET | Freq: Every day | ORAL | 3 refills | Status: DC

## 2016-07-13 NOTE — Telephone Encounter (Signed)
It appears she had some intolerance of other statins, per my chart review it appears Dr Vincente LibertyMolt has changed her [facility] where [facility] would cost $[facility].  If she is unable to afford this we could consider Livalo but I would see if she can pick up the medication at [facility].

## 2016-07-13 NOTE — Telephone Encounter (Signed)
Fax from [facility] - Crestor is no longer available for free - would u consider changing and writing a new rx for Livalo? Thanks

## 2016-07-13 NOTE — Telephone Encounter (Signed)
Talked to pt - stated ok to send rx to [facility] and she can afford $4.00 for her medication.

## 2016-07-18 ENCOUNTER — Telehealth: Payer: Self-pay

## 2016-07-18 NOTE — Telephone Encounter (Signed)
Please call pt back regarding meds.  

## 2016-07-19 NOTE — Telephone Encounter (Signed)
I spoke w/ pt she would like to get all her meds except the free ones from [facility] changed to [facility], dr kim could you and frank help with this?

## 2016-07-19 NOTE — Telephone Encounter (Signed)
Sure no problem

## 2016-08-16 ENCOUNTER — Telehealth: Payer: Self-pay

## 2016-08-16 NOTE — Telephone Encounter (Signed)
Requesting to speak with a nurse. Please call back.  

## 2016-08-17 NOTE — Telephone Encounter (Signed)
Pt stated she had called about 2 -3 weeks ago requesting her meds be transfer to [facility] and appt be mailed but stated this was not done. I will send message to Amada KingfisherJen Kim, who will be back in her office tomorrow. And I put her Jan appt in the mail.

## 2016-08-21 NOTE — Telephone Encounter (Signed)
Contacted [facility] and found out patient is approved to get her medications, but is still waiting on Dulera. Will help with samples at next patient appointment.

## 2016-09-07 ENCOUNTER — Telehealth: Payer: Self-pay | Admitting: Internal Medicine

## 2016-09-07 NOTE — Telephone Encounter (Signed)
APT. REMINDER CALL, NO ANSWER, NO VOICEMAIL °

## 2016-09-12 ENCOUNTER — Ambulatory Visit (INDEPENDENT_AMBULATORY_CARE_PROVIDER_SITE_OTHER): Payer: Self-pay | Admitting: Internal Medicine

## 2016-09-12 ENCOUNTER — Encounter: Payer: Self-pay | Admitting: Internal Medicine

## 2016-09-12 ENCOUNTER — Encounter (INDEPENDENT_AMBULATORY_CARE_PROVIDER_SITE_OTHER): Payer: Self-pay

## 2016-09-12 ENCOUNTER — Ambulatory Visit: Payer: Self-pay | Admitting: Pharmacist

## 2016-09-12 VITALS — BP 122/70 | HR 106 | Temp 98.7°F | Ht 62.0 in | Wt 141.6 lb

## 2016-09-12 DIAGNOSIS — G8929 Other chronic pain: Secondary | ICD-10-CM

## 2016-09-12 DIAGNOSIS — E785 Hyperlipidemia, unspecified: Secondary | ICD-10-CM

## 2016-09-12 DIAGNOSIS — M545 Low back pain: Secondary | ICD-10-CM

## 2016-09-12 DIAGNOSIS — Z79899 Other long term (current) drug therapy: Secondary | ICD-10-CM

## 2016-09-12 DIAGNOSIS — J45990 Exercise induced bronchospasm: Secondary | ICD-10-CM

## 2016-09-12 DIAGNOSIS — R202 Paresthesia of skin: Secondary | ICD-10-CM

## 2016-09-12 DIAGNOSIS — J069 Acute upper respiratory infection, unspecified: Secondary | ICD-10-CM

## 2016-09-12 DIAGNOSIS — J3489 Other specified disorders of nose and nasal sinuses: Secondary | ICD-10-CM

## 2016-09-12 DIAGNOSIS — R631 Polydipsia: Secondary | ICD-10-CM

## 2016-09-12 DIAGNOSIS — K649 Unspecified hemorrhoids: Secondary | ICD-10-CM

## 2016-09-12 DIAGNOSIS — Z833 Family history of diabetes mellitus: Secondary | ICD-10-CM

## 2016-09-12 DIAGNOSIS — M62838 Other muscle spasm: Secondary | ICD-10-CM

## 2016-09-12 DIAGNOSIS — R05 Cough: Secondary | ICD-10-CM

## 2016-09-12 DIAGNOSIS — J209 Acute bronchitis, unspecified: Secondary | ICD-10-CM | POA: Insufficient documentation

## 2016-09-12 DIAGNOSIS — H9190 Unspecified hearing loss, unspecified ear: Secondary | ICD-10-CM

## 2016-09-12 DIAGNOSIS — H9193 Unspecified hearing loss, bilateral: Secondary | ICD-10-CM

## 2016-09-12 MED ORDER — CYCLOBENZAPRINE HCL 10 MG PO TABS: 10.0000 mg | ORAL_TABLET | Freq: Three times a day (TID) | ORAL | 2 refills | Status: DC | PRN

## 2016-09-12 MED ORDER — BENZONATATE 100 MG PO CAPS: 100.0000 mg | ORAL_CAPSULE | Freq: Three times a day (TID) | ORAL | 1 refills | Status: DC | PRN

## 2016-09-12 MED ORDER — ROSUVASTATIN CALCIUM 10 MG PO TABS: 10.0000 mg | ORAL_TABLET | Freq: Every day | ORAL | 3 refills | Status: DC

## 2016-09-12 MED ORDER — MOMETASONE FURO-FORMOTEROL FUM 100-5 MCG/ACT IN AERO: 1.0000 | INHALATION_SPRAY | Freq: Two times a day (BID) | RESPIRATORY_TRACT | 11 refills | Status: DC

## 2016-09-12 MED ORDER — POLYETHYLENE GLYCOL 3350 17 G PO PACK: 17.0000 g | PACK | Freq: Every day | ORAL | 3 refills | Status: DC

## 2016-09-12 NOTE — Assessment & Plan Note (Signed)
The patient has chronic low back pain and neck pain described as muscle spasms. This responds well to Flexeril when necessary, and reports she does not take this medication daily.  -Refill has been given for Flexeril in addition recommendation of  heating pads

## 2016-09-12 NOTE — Assessment & Plan Note (Signed)
The patient endorses a prior history of progressively worsening hearing. Reports that her family has been telling her that she is speaking louder and bladder in addition to requiring people to repeat things several times. She works she reports acute worsening over the past 2 weeks however this is likely due to her viral upper respiratory tract infection however the patient requests an audiology referral for her progressive hearing loss times several years. -Audiology referral

## 2016-09-12 NOTE — Assessment & Plan Note (Signed)
Patient here with a two-week history of cough, currently productive of green sputum, runny nose with sinus congestion following contact with sick children with similar symptoms. Patient reports she has tried several over-the-counter medications including Mucinex for 2 days, Robitussin and Tylenol without significant relief of her symptoms. She reports she's been using her Dulera twice daily and albuterol twice daily without significant improvement of her cough. She denies any wheezing. Patient very much so wishes not to be on antibiotics. Physical exam consistent with viral URI. Does not appear to have a secondary bacterial infection at this time. -Intranasal steroids, Nasonex or Flonase recommended -Sinus irrigation twice daily prior to administration of nasal steroids  -Prescription given for Tessalon Perles to help he is coughing at night -Continue Mucinex and Tylenol. -Encouraged increased fluid intake and rest. Instructions to return to clinic in 1-2 weeks if symptoms do not improve

## 2016-09-12 NOTE — Assessment & Plan Note (Signed)
Patient self discontinued herself from Crestor last year she went to try to manage her hyperlipidemia by diet alone. Patient fasting today and requests a fasting lipid panel which has been ordered. Anticipate patient will likely need to be restarted on her Crestor 10 mg however follow up results of the lipid panel.

## 2016-09-12 NOTE — Assessment & Plan Note (Signed)
Patient endorses increased thirst and urination lately. In addition she also reports a significant family history of type 2 diabetes in several of her immediate family members. She also describes some tingling of her bilateral feet and I question if patient may be developing diabetes. -Hemoglobin A1c today. Most recent one obtain several years ago was 5.9% -Encourage patient to continue with diet and exercise and to try to reduce her carbohydrate intake

## 2016-09-12 NOTE — Progress Notes (Signed)
   CC: Medication refill, cough and congestion 2 weeks  HPI:  [name] is a very pleasant 51 y.o. female with medical history significant for chronic low back pain and neck pain controlled with when necessary Flexeril, hyperlipidemia with self discontinuation of statin several months ago, COPD controlled with Dulera and when necessary albuterol. She presents today for refills of her medications as well as evaluation of a two-week history of sinus and nasal congestion, cough recently productive of green sputum, and ear fullness. 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 She reports she's tried numerous over-the-counter medications including Mucinex 2 days, Robitussin and Tylenol without significant relief of her symptoms. She does report that her symptoms seem to be improving however. She reports she's been using her delay or twice daily and albuterol twice daily as well without significant improvement. In regards to the patient's ear fullness she reports feeling slightly off balance in that her hearing has acutely worsened over the past 2 weeks. Prior to that the patient noticed a several year history of progressively worsening hearing. She reports that her family have noticed that she speaks very loudly and often has to have them repeat what they say to her. She wonders if she can have her hearing checked.  Past Medical History:  Diagnosis Date  . Allergic rhinitis   . Allergy    seasonal  . Anxiety   . Arthritis    back  . Chronic bronchitis (HCC)   . COPD (chronic obstructive pulmonary disease) (HCC)   . Depression   . Fibromyalgia   . GERD (gastroesophageal reflux disease)   . Hyperlipidemia   . Low back pain     Review of Systems:  Review of Systems  Constitutional: Positive for chills, fever and malaise/fatigue.  HENT: Positive for congestion, hearing loss, sinus pain and tinnitus. Negative for ear pain and sore  throat.   Respiratory: Positive for cough and sputum production. Negative for hemoptysis and wheezing.   Cardiovascular: Negative for chest pain, palpitations and leg swelling.  Gastrointestinal: Negative for abdominal pain, diarrhea, heartburn, nausea and vomiting.  Genitourinary: Negative for dysuria, frequency and urgency.  Musculoskeletal: Positive for back pain and neck pain. Negative for myalgias.     Physical Exam: Physical Exam  Constitutional: She is well-developed, well-nourished, and in no distress. No distress.  HENT:  Head: Normocephalic and atraumatic.  Mouth/Throat: Oropharynx is clear and moist. No oropharyngeal exudate.  BL otoscopic examination revealed some fluid behind the tympanic membrane however was not erythematous nor bulging. No significant maxillary tenderness, some minor frontal sinus tenderness. Posterior pharynx with changes consistent with post-nasal drip.  Eyes: Conjunctivae are normal. No scleral icterus.  Cardiovascular: Normal rate, regular rhythm and normal heart sounds.   Pulmonary/Chest: Effort normal and breath sounds normal. No stridor. No respiratory distress. She has no wheezes.  Abdominal: Soft. Bowel sounds are normal. She exhibits no distension. There is no tenderness.  Lymphadenopathy:    She has no cervical adenopathy.  Skin: Skin is warm and dry. She is not diaphoretic.    Vitals:   09/12/16 1544  BP: 122/70  Pulse: (!) 106  Temp: 98.7 F (37.1 C)  TempSrc: Oral  SpO2: 100%  Weight: 141 lb 9.6 oz (64.2 kg)  Height: 5\' 2"  (1.575 m)    Assessment & Plan:   See Encounters Tab for problem based charting.  Patient discussed with Dr. Oswaldo DoneVincent

## 2016-09-12 NOTE — Patient Instructions (Addendum)
It was an absolute pleasure meeting you today! I'm sorry you are not feeling well.  It sounds like you have whats called an upper respiratory tract infection, which encompasses bronchitis and sinus congestion. These are usually caused by viruses. For this, the best treatment is SINUS IRRIGATION. You can buy this over the counter at your local pharmacy. Please do this twice a day followed by intranasal steroids like Nasonex or Flonase. Please continue to take Mucinex and drink plenty of fluids. I have also prescribed you a cough medication called Tesalon Perles. Please come back and see us if you are not feeling better after 2 weeks. We may need antibiotics at that point.   I'm checking some labs on you today as well. I will call you if the results are abnormal. At our next visit, I recommend ordering standard health-maintenance studies including mammogram and colonoscopy.   Sinus Rinse WHAT IS A SINUS RINSE? A sinus rinse is a simple home treatment that is used to rinse your sinuses with a sterile mixture of salt and water (saline solution). Sinuses are air-filled spaces in your skull behind the bones of your face and forehead that open into your nasal cavity. You will use the following:  Saline solution.  Neti pot or spray bottle. This releases the saline solution into your nose and through your sinuses. Neti pots and spray bottles can be purchased at [facility] local pharmacy, a health food store, or online. WHEN WOULD I DO A SINUS RINSE? A sinus rinse can help to clear mucus, dirt, dust, or pollen from the nasal cavity. You may do a sinus rinse when you have a cold, a virus, nasal allergy symptoms, a sinus infection, or stuffiness in the nose or sinuses. If you are considering a sinus rinse:  Ask your child's health care provider before performing a sinus rinse on your child.  Do not do a sinus rinse if you have had ear or nasal surgery, ear infection, or blocked ears. HOW DO I DO A SINUS  RINSE?  Wash your hands.  Disinfect your device according to the directions provided and then dry it.  Use the solution that comes with your device or one that is sold separately in stores. Follow the mixing directions on the package.  Fill your device with the amount of saline solution as directed by the device instructions.  Stand over a sink and tilt your head sideways over the sink.  Place the spout of the device in your upper nostril (the one closer to the ceiling).  Gently pour or squeeze the saline solution into the nasal cavity. The liquid should drain to the lower nostril if you are not overly congested.  Gently blow your nose. Blowing too hard may cause ear pain.  Repeat in the other nostril.  Clean and rinse your device with clean water and then air-dry it. ARE THERE RISKS OF A SINUS RINSE? Sinus rinse is generally very safe and effective. However, there are a few risks, which include:  A burning sensation in the sinuses. This may happen if you do not make the saline solution as directed. Make sure to follow all directions when making the saline solution.  Infection from contaminated water. This is rare, but possible.  Nasal irritation. This information is not intended to replace advice given to you by your health care provider. Make sure you discuss any questions you have with your health care provider. Document Released: 03/25/2014 Document Revised: 04/23/2016 Document Reviewed: 01/13/2014 Elsevier Interactive Patient  Education  2017 Elsevier Inc.  

## 2016-09-13 ENCOUNTER — Other Ambulatory Visit: Payer: Self-pay | Admitting: Pharmacist

## 2016-09-13 ENCOUNTER — Telehealth: Payer: Self-pay | Admitting: Internal Medicine

## 2016-09-13 DIAGNOSIS — J069 Acute upper respiratory infection, unspecified: Secondary | ICD-10-CM

## 2016-09-13 DIAGNOSIS — J45990 Exercise induced bronchospasm: Secondary | ICD-10-CM

## 2016-09-13 LAB — LIPID PANEL
CHOL/HDL RATIO: 5.1 ratio — AB (ref 0.0–4.4)
Cholesterol, Total: 223 mg/dL — ABNORMAL HIGH (ref 100–199)
HDL: 44 mg/dL (ref >39)
LDL Calculated: 141 mg/dL — ABNORMAL HIGH (ref 0–99)
Triglycerides: 190 mg/dL — ABNORMAL HIGH (ref 0–149)
VLDL CHOLESTEROL CAL: 38 mg/dL (ref 5–40)

## 2016-09-13 LAB — HEMOGLOBIN A1C
ESTIMATED AVERAGE GLUCOSE: 131 mg/dL
HEMOGLOBIN A1C: 6.2 % — AB (ref 4.8–5.6)

## 2016-09-13 MED ORDER — BENZONATATE 100 MG PO CAPS: 100.0000 mg | ORAL_CAPSULE | Freq: Three times a day (TID) | ORAL | 1 refills | Status: DC | PRN

## 2016-09-13 NOTE — Telephone Encounter (Signed)
Pt requesting a rtn call in reference to her medications she discussed yesterday with you.  Pt would like a call back this morning if possible.

## 2016-09-13 NOTE — Progress Notes (Signed)
Internal Medicine Clinic Attending  Case discussed with Dr. Molt at the time of the visit.  We reviewed the resident's history and exam and pertinent patient test results.  I agree with the assessment, diagnosis, and plan of care documented in the resident's note. 

## 2016-09-13 NOTE — Progress Notes (Signed)
Patient referred to [facility]

## 2016-09-14 ENCOUNTER — Telehealth: Payer: Self-pay | Admitting: Internal Medicine

## 2016-09-14 ENCOUNTER — Other Ambulatory Visit: Payer: Self-pay | Admitting: Pharmacist

## 2016-09-14 ENCOUNTER — Other Ambulatory Visit: Payer: Self-pay | Admitting: Internal Medicine

## 2016-09-14 DIAGNOSIS — J45990 Exercise induced bronchospasm: Secondary | ICD-10-CM

## 2016-09-14 MED ORDER — ALBUTEROL SULFATE (2.5 MG/3ML) 0.083% IN NEBU: 2.5000 mg | INHALATION_SOLUTION | RESPIRATORY_TRACT | 3 refills | Status: DC

## 2016-09-14 MED ORDER — SULFAMETHOXAZOLE-TRIMETHOPRIM 800-160 MG PO TABS: 1.0000 | ORAL_TABLET | Freq: Two times a day (BID) | ORAL | 0 refills | Status: DC

## 2016-09-14 NOTE — Telephone Encounter (Signed)
Pt states she's not doing any better since OV on 1/2. States she refused antibiotic rx but now would like one prescribed and one that's on the $4 list. Thanks

## 2016-09-14 NOTE — Telephone Encounter (Signed)
Please call back in reference to her visit on yesterday about her medication.

## 2016-09-14 NOTE — Telephone Encounter (Signed)
That's too bad she's not feeling better with conservative/supportive therapy! Have reviewed patients chart and believe this is consistent with her episodes of recurrent sinusitis which have responded well to Bactrim in the past. Given patients penicillin allergy, have sent in Rx [facility] ds [facility]ID [facility] [address] Could you please call and inform the patient? Thanks so much!

## 2016-09-15 NOTE — Telephone Encounter (Signed)
Called pt to let her know rx for Bactrim DS has been sent to [facility] per Dr Vincente LibertyMolt. Stated she no longer get her rxs there; only @ [facility] or [facility]. Told her Bactrim is probably on their $4 list. She did not think so. So, I called [facility] and was told it will cost $4.00. Called pt back to let her know; stated ok.

## 2016-09-15 NOTE — Telephone Encounter (Signed)
Thanks Glenda. I was sure to check that it was on the $4 list.

## 2016-09-21 ENCOUNTER — Telehealth: Payer: Self-pay

## 2016-09-21 NOTE — Telephone Encounter (Signed)
Needs to speak with a nurse regarding feet pain. Please call back.

## 2016-09-21 NOTE — Telephone Encounter (Signed)
Spoke w/ pt, she wanted her lab results and wanted to know what was wrong with her feet, stated that the doctor did not write anything down and she is concerned that the doctor forgot about her. She states her burning in bilateral heels continues and she knows she does not have diabetes but she does thinks she has neuropathy due to as she is aging her bones and muscles are thinning and that is what causes neuropathy. She was encouraged to come in for a f/u appt and refuses at this time stating she must make arrangements with her daughter before she can make an appt, she will call back for appt She would like for the doctor to call her

## 2016-10-11 ENCOUNTER — Other Ambulatory Visit: Payer: Self-pay | Admitting: *Deleted

## 2016-10-11 DIAGNOSIS — N951 Menopausal and female climacteric states: Secondary | ICD-10-CM

## 2016-10-12 MED ORDER — ESTROGENS, CONJUGATED 0.625 MG/GM VA CREA: TOPICAL_CREAM | VAGINAL | 2 refills | Status: DC

## 2016-10-26 ENCOUNTER — Other Ambulatory Visit: Payer: Self-pay | Admitting: Pharmacist

## 2016-10-26 DIAGNOSIS — G8929 Other chronic pain: Secondary | ICD-10-CM

## 2016-10-26 DIAGNOSIS — M545 Low back pain: Secondary | ICD-10-CM

## 2016-10-26 DIAGNOSIS — J069 Acute upper respiratory infection, unspecified: Secondary | ICD-10-CM

## 2016-10-26 DIAGNOSIS — J45990 Exercise induced bronchospasm: Secondary | ICD-10-CM

## 2016-10-26 MED ORDER — BENZONATATE 100 MG PO CAPS: 100.0000 mg | ORAL_CAPSULE | Freq: Three times a day (TID) | ORAL | 1 refills | Status: AC | PRN

## 2016-10-26 MED ORDER — ALBUTEROL SULFATE (2.5 MG/3ML) 0.083% IN NEBU: 2.5000 mg | INHALATION_SOLUTION | RESPIRATORY_TRACT | 3 refills | Status: DC

## 2016-10-26 MED ORDER — ALBUTEROL SULFATE HFA 108 (90 BASE) MCG/ACT IN AERS: 2.0000 | INHALATION_SPRAY | Freq: Four times a day (QID) | RESPIRATORY_TRACT | 3 refills | Status: DC | PRN

## 2016-10-26 MED ORDER — CYCLOBENZAPRINE HCL 10 MG PO TABS: 10.0000 mg | ORAL_TABLET | Freq: Three times a day (TID) | ORAL | 2 refills | Status: DC | PRN

## 2016-10-26 NOTE — Progress Notes (Signed)
Patient requested prescription transfers to [facility], patient is already enrolled in IM program. Prescriptions transferred.

## 2016-11-02 ENCOUNTER — Ambulatory Visit: Payer: Self-pay

## 2016-11-02 ENCOUNTER — Encounter (INDEPENDENT_AMBULATORY_CARE_PROVIDER_SITE_OTHER): Payer: Self-pay

## 2016-11-16 ENCOUNTER — Ambulatory Visit: Payer: Self-pay | Admitting: Audiology

## 2016-11-16 NOTE — Addendum Note (Signed)
Addended by: Neomia DearPOWERS, Yacqub Baston E on: 11/16/2016 08:33 PM   Modules accepted: Orders

## 2016-12-15 ENCOUNTER — Other Ambulatory Visit: Payer: Self-pay | Admitting: *Deleted

## 2016-12-15 DIAGNOSIS — J069 Acute upper respiratory infection, unspecified: Secondary | ICD-10-CM

## 2016-12-15 DIAGNOSIS — G8929 Other chronic pain: Secondary | ICD-10-CM

## 2016-12-15 DIAGNOSIS — M545 Low back pain: Secondary | ICD-10-CM

## 2016-12-15 DIAGNOSIS — J45990 Exercise induced bronchospasm: Secondary | ICD-10-CM

## 2016-12-15 MED ORDER — MOMETASONE FUROATE 50 MCG/ACT NA SUSP: 2.0000 | Freq: Every day | NASAL | 6 refills | Status: DC

## 2016-12-21 ENCOUNTER — Other Ambulatory Visit: Payer: Self-pay | Admitting: Pharmacist

## 2016-12-21 DIAGNOSIS — N951 Menopausal and female climacteric states: Secondary | ICD-10-CM

## 2016-12-21 DIAGNOSIS — J069 Acute upper respiratory infection, unspecified: Secondary | ICD-10-CM

## 2016-12-21 DIAGNOSIS — M545 Low back pain: Secondary | ICD-10-CM

## 2016-12-21 DIAGNOSIS — G8929 Other chronic pain: Secondary | ICD-10-CM

## 2016-12-21 DIAGNOSIS — J45990 Exercise induced bronchospasm: Secondary | ICD-10-CM

## 2016-12-21 MED ORDER — MOMETASONE FURO-FORMOTEROL FUM 100-5 MCG/ACT IN AERO: 1.0000 | INHALATION_SPRAY | Freq: Two times a day (BID) | RESPIRATORY_TRACT | 11 refills | Status: DC

## 2016-12-21 MED ORDER — ESTROGENS, CONJUGATED 0.625 MG/GM VA CREA: TOPICAL_CREAM | VAGINAL | 2 refills | Status: DC

## 2016-12-21 MED ORDER — MOMETASONE FUROATE 50 MCG/ACT NA SUSP: 2.0000 | Freq: Every day | NASAL | 6 refills | Status: DC

## 2016-12-21 MED ORDER — ALBUTEROL SULFATE HFA 108 (90 BASE) MCG/ACT IN AERS
2.0000 | INHALATION_SPRAY | Freq: Four times a day (QID) | RESPIRATORY_TRACT | 3 refills | Status: DC | PRN
Start: 2016-12-21 — End: 2017-04-13

## 2016-12-21 NOTE — Progress Notes (Signed)
Prescriptions transferred to [facility].

## 2017-01-01 ENCOUNTER — Other Ambulatory Visit: Payer: Self-pay

## 2017-01-01 DIAGNOSIS — G8929 Other chronic pain: Secondary | ICD-10-CM

## 2017-01-01 DIAGNOSIS — E785 Hyperlipidemia, unspecified: Secondary | ICD-10-CM

## 2017-01-01 DIAGNOSIS — M545 Low back pain: Principal | ICD-10-CM

## 2017-01-01 MED ORDER — CYCLOBENZAPRINE HCL 10 MG PO TABS: 10.0000 mg | ORAL_TABLET | Freq: Three times a day (TID) | ORAL | 2 refills | Status: DC | PRN

## 2017-01-01 MED ORDER — ROSUVASTATIN CALCIUM 10 MG PO TABS: 10.0000 mg | ORAL_TABLET | Freq: Every day | ORAL | 3 refills | Status: DC

## 2017-01-01 NOTE — Telephone Encounter (Signed)
Thanks Dr. Selena Batten!! Much appreciated.

## 2017-04-11 ENCOUNTER — Other Ambulatory Visit: Payer: Self-pay | Admitting: Pharmacist

## 2017-04-11 DIAGNOSIS — M545 Low back pain: Secondary | ICD-10-CM

## 2017-04-11 DIAGNOSIS — G8929 Other chronic pain: Secondary | ICD-10-CM

## 2017-04-11 DIAGNOSIS — E785 Hyperlipidemia, unspecified: Secondary | ICD-10-CM

## 2017-04-11 MED ORDER — ROSUVASTATIN CALCIUM 10 MG PO TABS: 10.0000 mg | ORAL_TABLET | Freq: Every day | ORAL | 3 refills | Status: DC

## 2017-04-11 MED ORDER — CYCLOBENZAPRINE HCL 10 MG PO TABS: 10.0000 mg | ORAL_TABLET | Freq: Three times a day (TID) | ORAL | 2 refills | Status: DC | PRN

## 2017-04-13 ENCOUNTER — Ambulatory Visit (INDEPENDENT_AMBULATORY_CARE_PROVIDER_SITE_OTHER): Payer: Self-pay | Admitting: Internal Medicine

## 2017-04-13 ENCOUNTER — Encounter: Payer: Self-pay | Admitting: Internal Medicine

## 2017-04-13 VITALS — BP 120/76 | HR 100 | Temp 98.0°F | Ht 62.0 in | Wt 140.5 lb

## 2017-04-13 DIAGNOSIS — R232 Flushing: Secondary | ICD-10-CM

## 2017-04-13 DIAGNOSIS — M659 Synovitis and tenosynovitis, unspecified: Secondary | ICD-10-CM | POA: Insufficient documentation

## 2017-04-13 DIAGNOSIS — G8929 Other chronic pain: Secondary | ICD-10-CM

## 2017-04-13 DIAGNOSIS — R4586 Emotional lability: Secondary | ICD-10-CM

## 2017-04-13 DIAGNOSIS — M545 Low back pain: Secondary | ICD-10-CM

## 2017-04-13 DIAGNOSIS — M65949 Unspecified synovitis and tenosynovitis, unspecified hand: Secondary | ICD-10-CM

## 2017-04-13 DIAGNOSIS — G478 Other sleep disorders: Secondary | ICD-10-CM

## 2017-04-13 DIAGNOSIS — F329 Major depressive disorder, single episode, unspecified: Secondary | ICD-10-CM

## 2017-04-13 DIAGNOSIS — M65842 Other synovitis and tenosynovitis, left hand: Secondary | ICD-10-CM

## 2017-04-13 DIAGNOSIS — N898 Other specified noninflammatory disorders of vagina: Secondary | ICD-10-CM

## 2017-04-13 DIAGNOSIS — J45990 Exercise induced bronchospasm: Secondary | ICD-10-CM | POA: Insufficient documentation

## 2017-04-13 DIAGNOSIS — N951 Menopausal and female climacteric states: Secondary | ICD-10-CM

## 2017-04-13 DIAGNOSIS — K649 Unspecified hemorrhoids: Secondary | ICD-10-CM

## 2017-04-13 DIAGNOSIS — R7303 Prediabetes: Secondary | ICD-10-CM

## 2017-04-13 MED ORDER — ALBUTEROL SULFATE HFA 108 (90 BASE) MCG/ACT IN AERS: 2.0000 | INHALATION_SPRAY | Freq: Four times a day (QID) | RESPIRATORY_TRACT | 3 refills | Status: DC | PRN

## 2017-04-13 MED ORDER — ESTROGENS, CONJUGATED 0.625 MG/GM VA CREA: TOPICAL_CREAM | VAGINAL | 2 refills | Status: DC

## 2017-04-13 MED ORDER — MOMETASONE FUROATE 50 MCG/ACT NA SUSP: 2.0000 | Freq: Every day | NASAL | 6 refills | Status: DC

## 2017-04-13 MED ORDER — ALBUTEROL SULFATE (2.5 MG/3ML) 0.083% IN NEBU: 2.5000 mg | INHALATION_SOLUTION | RESPIRATORY_TRACT | 3 refills | Status: DC

## 2017-04-13 NOTE — Progress Notes (Signed)
   CC: Left 3rd finger locking  HPI:  Briana Parker is a 51 y.o. presenting today with a progressive history of L hand 3rd digit pain and "locking" of 2 months duration. She has had similar episodes in the past, thumb on her right and left hand. At which point she got injections that helped tremendously. She has to manual pull her finger straight. She acknowledges some joint pain but believes it is more related to this finger "locking."  She is also requesting medication refills of her inhalers and estrogen cream. We discussed her recent A1c of 6.2. She has been working to change her diet and now walks 2 miles per day. She agrees to follow-up in two months for recheck of her A1c.   Past Medical History:  Diagnosis Date  . Allergic rhinitis   . Allergy    seasonal  . Anxiety   . Arthritis    back  . Chronic bronchitis (HCC)   . COPD (chronic obstructive pulmonary disease) (HCC)   . Depression   . Fibromyalgia   . GERD (gastroesophageal reflux disease)   . Hyperlipidemia   . Low back pain    Review of Systems:   She denies fever, chills, N/V, diarrhea, chest pain, SOA.  Physical Exam: Vitals:   04/13/17 1319  BP: 120/76  Pulse: 100  Temp: 98 F (36.7 C)  TempSrc: Oral  SpO2: 100%  Weight: 140 lb 8 oz (63.7 kg)  Height: 5\' 2"  (1.575 m)   Physical Exam  Constitutional: She is oriented to person, place, and time. She appears well-developed and well-nourished.  HENT:  Head: Normocephalic and atraumatic.  Eyes: Pupils are equal, round, and reactive to light. Conjunctivae are normal.  Cardiovascular: Normal rate, regular rhythm, normal heart sounds and intact distal pulses.   Pulmonary/Chest: Effort normal and breath sounds normal.  Abdominal: Soft. Bowel sounds are normal.  Musculoskeletal: She exhibits no edema.  Nodule felt on the flexor tendon of the 3rd digit on the left hand.  Neurological: She is alert and oriented to person, place, and time.   Assessment &  Plan:   See Encounters Tab for problem based charting.  Patient seen with Dr. Oswaldo DoneVincent

## 2017-04-13 NOTE — Progress Notes (Signed)
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  All questions were answered.    The flexor surface of the left middle finger was cleaned with alcohol swabs. Using a 25 gauge 1 inch needle the flexor tendon was entered, confirmed by movement of the finger. The needle was withdrawn until resistance on the plunger decreased. The tendon sheath space was then injected with 1 mL of Triamcinolone (40mg ) and 1 mL of 1% Lidocaine without Epi. The needle was withdrawn, the site was cleaned and covered with a band aid.   The patient did tolerate the procedure well and there were not complications.

## 2017-04-13 NOTE — Assessment & Plan Note (Signed)
A1c in 09/2016 of 6.2, discussed that she has pre-diabetes. She is working to change her diet and is now walking 2 miles per day. Encouraged to continue to do so. Discussed follow-up in 2 months.

## 2017-04-13 NOTE — Patient Instructions (Addendum)
It was a pleasure to meet you today. Please do the following:  - Follow-up in 2 months to ensure your trigger finger is gone and for a recheck of your blood sugars.   - Continue to work on [employer] diet and exercise.   - Your med refills were sent out.  Trigger Finger Trigger finger (stenosing tenosynovitis) is a condition that causes a finger to get stuck in a bent position. Each finger has a tough, cord-like tissue that connects muscle to bone (tendon), and each tendon is surrounded by a tunnel of tissue (tendon sheath). To move your finger, your tendon needs to slide freely through the sheath. Trigger finger happens when the tendon or the sheath thickens, making it difficult to move your finger. Trigger finger can affect any finger or a thumb. It may affect more than one finger. Mild cases may clear up with rest and medicine. Severe cases require more treatment. What are the causes? Trigger finger is caused by a thickened finger tendon or tendon sheath. The cause of this thickening is not known. What increases the risk? The following factors may make you more likely to develop this condition:  Doing activities that require a strong grip.  Having rheumatoid arthritis, gout, or diabetes.  Being 4340-853 years old.  Being a woman.  What are the signs or symptoms? Symptoms of this condition include:  Pain when bending or straightening your finger.  Tenderness or swelling where your finger attaches to the palm of your hand.  A lump in the palm of your hand or on the inside of your finger.  Hearing a popping sound when you try to straighten your finger.  Feeling a popping, catching, or locking sensation when you try to straighten your finger.  Being unable to straighten your finger.  How is this diagnosed? This condition is diagnosed based on your symptoms and a physical exam. How is this treated? This condition may be treated by:  Resting your finger and avoiding activities that  make symptoms worse.  Wearing a finger splint to keep your finger in a slightly bent position.  Taking NSAIDs to relieve pain and swelling.  Injecting medicine (steroids) into the tendon sheath to reduce swelling and irritation. Injections may need to be repeated.  Having surgery to open the tendon sheath. This may be done if other treatments do not work and you cannot straighten your finger. You may need physical therapy after surgery.  Follow these instructions at home:  Use moist heat to help reduce pain and swelling as told by your health care provider.  Rest your finger and avoid activities that make pain worse. Return to normal activities as told by your health care provider.  If you have a splint, wear it as told by your health care provider.  Take over-the-counter and prescription medicines only as told by your health care provider.  Keep all follow-up visits as told by your health care provider. This is important. Contact a health care provider if:  Your symptoms are not improving with home care. Summary  Trigger finger (stenosing tenosynovitis) causes your finger to get stuck in a bent position, and it can make it difficult and painful to straighten your finger.  This condition develops when a finger tendon or tendon sheath thickens.  Treatment starts with resting, wearing a splint, and taking NSAIDs.  In severe cases, surgery to open the tendon sheath may be needed. This information is not intended to replace advice given to you by your health  care provider. Make sure you discuss any questions you have with your health care provider. Document Released: 06/17/2004 Document Revised: 08/08/2016 Document Reviewed: 08/08/2016 Elsevier Interactive Patient Education  2017 [employer]

## 2017-04-13 NOTE — Assessment & Plan Note (Signed)
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  Plan: - Steroid inject preformed by Dr. Oswaldo DoneVincent  - Follow-up in 2 months

## 2017-04-13 NOTE — Assessment & Plan Note (Signed)
Continue vaginal estrogen cream

## 2017-04-16 NOTE — Progress Notes (Signed)
Internal Medicine Clinic Attending  I saw and evaluated the patient.  I personally confirmed the key portions of the history and exam documented by Dr. Helberg and I reviewed pertinent patient test results.  The assessment, diagnosis, and plan were formulated together and I agree with the documentation in the resident's note. 

## 2017-04-18 ENCOUNTER — Telehealth: Payer: Self-pay

## 2017-04-18 MED ORDER — MOMETASONE FURO-FORMOTEROL FUM 100-5 MCG/ACT IN AERO: 1.0000 | INHALATION_SPRAY | Freq: Two times a day (BID) | RESPIRATORY_TRACT | 11 refills | Status: DC

## 2017-04-18 MED ORDER — ALBUTEROL SULFATE HFA 108 (90 BASE) MCG/ACT IN AERS: 2.0000 | INHALATION_SPRAY | Freq: Four times a day (QID) | RESPIRATORY_TRACT | 3 refills | Status: DC | PRN

## 2017-04-18 MED ORDER — MOMETASONE FUROATE 50 MCG/ACT NA SUSP: 2.0000 | Freq: Every day | NASAL | 6 refills | Status: DC

## 2017-04-18 MED ORDER — POLYETHYLENE GLYCOL 3350 17 G PO PACK: 17.0000 g | PACK | Freq: Every day | ORAL | 3 refills | Status: DC

## 2017-04-18 MED ORDER — ALBUTEROL SULFATE (2.5 MG/3ML) 0.083% IN NEBU: 2.5000 mg | INHALATION_SOLUTION | RESPIRATORY_TRACT | 3 refills | Status: DC

## 2017-04-18 NOTE — Addendum Note (Signed)
Addended by: Mliss FritzKIM, JENNIFER J on: 04/18/2017 01:50 PM   Modules accepted: Orders

## 2017-04-18 NOTE — Telephone Encounter (Signed)
ERROR

## 2017-05-28 ENCOUNTER — Ambulatory Visit: Payer: Self-pay

## 2017-06-04 ENCOUNTER — Ambulatory Visit: Payer: Self-pay

## 2017-06-06 ENCOUNTER — Ambulatory Visit: Payer: Self-pay

## 2017-06-06 ENCOUNTER — Encounter (INDEPENDENT_AMBULATORY_CARE_PROVIDER_SITE_OTHER): Payer: Self-pay

## 2017-06-18 ENCOUNTER — Ambulatory Visit (INDEPENDENT_AMBULATORY_CARE_PROVIDER_SITE_OTHER): Payer: Self-pay | Admitting: *Deleted

## 2017-06-18 DIAGNOSIS — Z23 Encounter for immunization: Secondary | ICD-10-CM

## 2017-07-19 ENCOUNTER — Ambulatory Visit (INDEPENDENT_AMBULATORY_CARE_PROVIDER_SITE_OTHER): Payer: Self-pay | Admitting: Internal Medicine

## 2017-07-19 ENCOUNTER — Other Ambulatory Visit: Payer: Self-pay

## 2017-07-19 ENCOUNTER — Encounter: Payer: Self-pay | Admitting: Internal Medicine

## 2017-07-19 VITALS — BP 130/79 | HR 97 | Temp 97.8°F | Ht 62.0 in | Wt 142.5 lb

## 2017-07-19 DIAGNOSIS — E785 Hyperlipidemia, unspecified: Secondary | ICD-10-CM

## 2017-07-19 DIAGNOSIS — G8929 Other chronic pain: Secondary | ICD-10-CM

## 2017-07-19 DIAGNOSIS — R7303 Prediabetes: Secondary | ICD-10-CM

## 2017-07-19 DIAGNOSIS — M545 Low back pain: Secondary | ICD-10-CM

## 2017-07-19 DIAGNOSIS — M5442 Lumbago with sciatica, left side: Principal | ICD-10-CM

## 2017-07-19 DIAGNOSIS — Z1159 Encounter for screening for other viral diseases: Secondary | ICD-10-CM

## 2017-07-19 DIAGNOSIS — Z0000 Encounter for general adult medical examination without abnormal findings: Secondary | ICD-10-CM

## 2017-07-19 MED ORDER — GABAPENTIN 100 MG PO CAPS: 100.0000 mg | ORAL_CAPSULE | Freq: Three times a day (TID) | ORAL | 0 refills | Status: DC

## 2017-07-19 MED ORDER — ACETAMINOPHEN 500 MG PO TABS: 1000.0000 mg | ORAL_TABLET | Freq: Four times a day (QID) | ORAL | 0 refills | Status: DC | PRN

## 2017-07-19 MED ORDER — OXYCODONE-ACETAMINOPHEN 7.5-325 MG PO TABS: 1.0000 | ORAL_TABLET | Freq: Three times a day (TID) | ORAL | 0 refills | Status: DC | PRN

## 2017-07-19 NOTE — Progress Notes (Signed)
   CC: Requesting lab draw  HPI:  [name] is a 51 y.o. female with a past medical history listed below here today requesting lab draw and complaints of chronic low back pain.  For details of today's visit and the status of her chronic medical issues please refer to the assessment and plan.  Past Medical History:  Diagnosis Date  . Allergic rhinitis   . Allergy    seasonal  . Anxiety   . Arthritis    back  . Chronic bronchitis (HCC)   . COPD (chronic obstructive pulmonary disease) (HCC)   . Depression   . Fibromyalgia   . GERD (gastroesophageal reflux disease)   . Hyperlipidemia   . Low back pain    Review of Systems:   No chest pain or shortness of breath  Physical Exam:  Vitals:   07/19/17 1057  BP: 130/79  Pulse: 97  Temp: 97.8 F (36.6 C)  TempSrc: Oral  SpO2: 100%  Weight: 142 lb 8 oz (64.6 kg)  Height: 5\' 2"  (1.575 m)   Physical Exam  Constitutional: She is oriented to person, place, and time and well-developed, well-nourished, and in no distress. No distress.  Cardiovascular: Normal rate, regular rhythm and normal heart sounds.  Pulmonary/Chest: Effort normal and breath sounds normal.  Abdominal: Soft. Bowel sounds are normal.  Musculoskeletal:       Lumbar back: She exhibits tenderness.       Back:  Neurological: She is alert and oriented to person, place, and time. She has normal sensation, normal strength and normal reflexes. Gait normal.  Skin: Skin is warm and dry. No rash noted.  Psychiatric: Mood and affect normal.    Assessment & Plan:   See Encounters Tab for problem based charting.  Patient discussed with Dr. Criselda PeachesMullen

## 2017-07-19 NOTE — Patient Instructions (Addendum)
Ms. Margo AyeSheptock,  I am going to check your blood work today. For your back pain we will start a new medication called gabapentin.  It is important that you follow up with your regular doctor.  Gabapentin capsules or tablets What is this medicine? GABAPENTIN (GA ba pen tin) is used to control partial seizures in adults with epilepsy. It is also used to treat certain types of nerve pain. This medicine may be used for other purposes; ask your health care provider or pharmacist if you have questions. COMMON BRAND NAME(S): Active-PAC with Gabapentin, Gabarone, Neurontin What should I tell my health care provider before I take this medicine? They need to know if you have any of these conditions: -kidney disease -suicidal thoughts, plans, or attempt; a previous suicide attempt by you or a family member -an unusual or allergic reaction to gabapentin, other medicines, foods, dyes, or preservatives -pregnant or trying to get pregnant -breast-feeding How should I use this medicine? Take this medicine by mouth with a glass of water. Follow the directions on the prescription label. You can take it with or without food. If it upsets your stomach, take it with food.Take your medicine at regular intervals. Do not take it more often than directed. Do not stop taking except on your doctor's advice. If you are directed to break the 600 or 800 mg tablets in half as part of your dose, the extra half tablet should be used for the next dose. If you have not used the extra half tablet within 28 days, it should be thrown away. A special MedGuide will be given to you by the pharmacist with each prescription and refill. Be sure to read this information carefully each time. Talk to your pediatrician regarding the use of this medicine in children. Special care may be needed. Overdosage: If you think you have taken too much of this medicine contact a poison control center or emergency room at once. NOTE: This medicine is  only for you. Do not share this medicine with others. What if I miss a dose? If you miss a dose, take it as soon as you can. If it is almost time for your next dose, take only that dose. Do not take double or extra doses. What may interact with this medicine? Do not take this medicine with any of the following medications: -other gabapentin products This medicine may also interact with the following medications: -alcohol -antacids -antihistamines for allergy, cough and cold -certain medicines for anxiety or sleep -certain medicines for depression or psychotic disturbances -homatropine; hydrocodone -naproxen -narcotic medicines (opiates) for pain -phenothiazines like chlorpromazine, mesoridazine, prochlorperazine, thioridazine This list may not describe all possible interactions. Give your health care provider a list of all the medicines, herbs, non-prescription drugs, or dietary supplements you use. Also tell them if you smoke, drink alcohol, or use illegal drugs. Some items may interact with your medicine. What should I watch for while using this medicine? Visit your doctor or health care professional for regular checks on your progress. You may want to keep a record at home of how you feel your condition is responding to treatment. You may want to share this information with your doctor or health care professional at each visit. You should contact your doctor or health care professional if your seizures get worse or if you have any new types of seizures. Do not stop taking this medicine or any of your seizure medicines unless instructed by your doctor or health care professional. Stopping your medicine  suddenly can increase your seizures or their severity. Wear a medical identification bracelet or chain if you are taking this medicine for seizures, and carry a card that lists all your medications. You may get drowsy, dizzy, or have blurred vision. Do not drive, use machinery, or do anything  that needs mental alertness until you know how this medicine affects you. To reduce dizzy or fainting spells, do not sit or stand up quickly, especially if you are an older patient. Alcohol can increase drowsiness and dizziness. Avoid alcoholic drinks. Your mouth may get dry. Chewing sugarless gum or sucking hard candy, and drinking plenty of water will help. The use of this medicine may increase the chance of suicidal thoughts or actions. Pay special attention to how you are responding while on this medicine. Any worsening of mood, or thoughts of suicide or dying should be reported to your health care professional right away. Women who become pregnant while using this medicine may enroll in the [place] Antiepileptic Drug Pregnancy Registry by calling [phone]. This registry collects information about the safety of antiepileptic drug use during pregnancy. What side effects may I notice from receiving this medicine? Side effects that you should report to your doctor or health care professional as soon as possible: -allergic reactions like skin rash, itching or hives, swelling of the face, lips, or tongue -worsening of mood, thoughts or actions of suicide or dying Side effects that usually do not require medical attention (report to your doctor or health care professional if they continue or are bothersome): -constipation -difficulty walking or controlling muscle movements -dizziness -nausea -slurred speech -tiredness -tremors -weight gain This list may not describe all possible side effects. Call your doctor for medical advice about side effects. You may report side effects to FDA at 1-800-FDA-1088. Where should I keep my medicine? Keep out of reach of children. This medicine may cause accidental overdose and death if it taken by other adults, children, or pets. Mix any unused medicine with a substance like cat litter or coffee grounds. Then throw the medicine away in a sealed  container like a sealed bag or a coffee can with a lid. Do not use the medicine after the expiration date. Store at room temperature between 15 and 30 degrees C (59 and 86 degrees F). NOTE: This sheet is a summary. It may not cover all possible information. If you have questions about this medicine, talk to your doctor, pharmacist, or health care provider.  2018 Elsevier/Gold Standard (2013-10-24 15:26:50)

## 2017-07-19 NOTE — Assessment & Plan Note (Addendum)
Requesting check for Hep C today.  Hep C negative.

## 2017-07-20 LAB — BMP8+ANION GAP
Anion Gap: 15 mmol/L (ref 10.0–18.0)
BUN/Creatinine Ratio: 12 (ref 9–23)
BUN: 11 mg/dL (ref 6–24)
CO2: 25 mmol/L (ref 20–29)
Calcium: 9.6 mg/dL (ref 8.7–10.2)
Chloride: 101 mmol/L (ref 96–106)
Creatinine, Ser: 0.89 mg/dL (ref 0.57–1.00)
GFR calc Af Amer: 86 mL/min/{1.73_m2} (ref >59)
GFR calc non Af Amer: 74 mL/min/{1.73_m2} (ref >59)
Glucose: 90 mg/dL (ref 65–99)
Potassium: 4.3 mmol/L (ref 3.5–5.2)
Sodium: 141 mmol/L (ref 134–144)

## 2017-07-20 LAB — HEPATITIS C ANTIBODY: HEP C VIRUS AB: 0.1 {s_co_ratio} (ref 0.0–0.9)

## 2017-07-20 LAB — LIPID PANEL
CHOL/HDL RATIO: 3.1 ratio (ref 0.0–4.4)
Cholesterol, Total: 176 mg/dL (ref 100–199)
HDL: 56 mg/dL (ref >39)
LDL CALC: 98 mg/dL (ref 0–99)
Triglycerides: 110 mg/dL (ref 0–149)
VLDL Cholesterol Cal: 22 mg/dL (ref 5–40)

## 2017-07-20 LAB — HEMOGLOBIN A1C
Est. average glucose Bld gHb Est-mCnc: 128 mg/dL
Hgb A1c MFr Bld: 6.1 % — ABNORMAL HIGH (ref 4.8–5.6)

## 2017-07-20 NOTE — Assessment & Plan Note (Signed)
Patient reports taking Crestor 10 mg daily. Denies any side effects from the medication. Requesting repeat lipid panel today.  A/P: Re-check lipid panel > much improved from previous.  Continue crestor 10 mg daily

## 2017-07-20 NOTE — Assessment & Plan Note (Signed)
Requesting repeat A1c today. A1c still in pre-DM range at 6.1.

## 2017-07-20 NOTE — Assessment & Plan Note (Addendum)
Denies any acute changes to her chronic lower back pain. Aching pain and occassionaly sharp. Occurs randomly. Taking Tylenol and Flexeril without improvement. Some radiation down posterior left leg. Reports some knee pain as well. No leg weakness or numbness. No bladder or bowel incontinence.   A/P No alarm symptoms today. Chronic pain. Continue current medications and start trial of gabapentin. Follow up with PCP.

## 2017-07-23 NOTE — Progress Notes (Signed)
Internal Medicine Clinic Attending  Case discussed with Dr. Boswell at the time of the visit.  We reviewed the resident's history and exam and pertinent patient test results.  I agree with the assessment, diagnosis, and plan of care documented in the resident's note.  

## 2017-08-14 ENCOUNTER — Telehealth: Payer: Self-pay

## 2017-08-14 NOTE — Telephone Encounter (Signed)
Would like to speak with a nurse about gabapentin. Please call back.

## 2017-08-14 NOTE — Telephone Encounter (Signed)
Pt calls and states the gabapentin she was given has not worked unless she took 3 at a time. She states she needs the mg to be increased or the # she can take at one time. 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

## 2017-08-15 MED ORDER — GABAPENTIN 300 MG PO CAPS: 300.0000 mg | ORAL_CAPSULE | Freq: Three times a day (TID) | ORAL | 1 refills | Status: DC

## 2017-08-15 NOTE — Telephone Encounter (Signed)
Increasing to 300mg  pills is just fine. Thanks Harrold DonathNathan and MarshallHelen!

## 2017-08-15 NOTE — Telephone Encounter (Signed)
Informed pt, will send to [facility] for appt w/ dr molt

## 2017-08-15 NOTE — Telephone Encounter (Signed)
That is fine. I would be happy to increase the dose to 300 mg pills if she is tolerating that dose. I would like her to follow up with Dr. Vincente LibertyMolt at some point in the next few months though. If her symptoms change she needs to come back for re-evaluation.

## 2017-10-03 ENCOUNTER — Encounter: Payer: Self-pay | Admitting: Internal Medicine

## 2017-10-10 ENCOUNTER — Ambulatory Visit (INDEPENDENT_AMBULATORY_CARE_PROVIDER_SITE_OTHER): Payer: Self-pay | Admitting: Internal Medicine

## 2017-10-10 ENCOUNTER — Other Ambulatory Visit: Payer: Self-pay

## 2017-10-10 ENCOUNTER — Encounter: Payer: Self-pay | Admitting: Internal Medicine

## 2017-10-10 VITALS — BP 131/87 | HR 106 | Temp 98.2°F | Ht 62.0 in | Wt 150.0 lb

## 2017-10-10 DIAGNOSIS — K219 Gastro-esophageal reflux disease without esophagitis: Secondary | ICD-10-CM

## 2017-10-10 DIAGNOSIS — M546 Pain in thoracic spine: Secondary | ICD-10-CM

## 2017-10-10 DIAGNOSIS — M25511 Pain in right shoulder: Secondary | ICD-10-CM

## 2017-10-10 DIAGNOSIS — M542 Cervicalgia: Secondary | ICD-10-CM

## 2017-10-10 DIAGNOSIS — G8929 Other chronic pain: Secondary | ICD-10-CM | POA: Insufficient documentation

## 2017-10-10 MED ORDER — MOMETASONE FUROATE 50 MCG/ACT NA SUSP: 2.0000 | Freq: Every day | NASAL | 6 refills | Status: DC

## 2017-10-10 MED ORDER — GABAPENTIN 300 MG PO CAPS: 300.0000 mg | ORAL_CAPSULE | Freq: Three times a day (TID) | ORAL | 1 refills | Status: DC

## 2017-10-10 MED ORDER — ESTROGENS, CONJUGATED 0.625 MG/GM VA CREA: TOPICAL_CREAM | VAGINAL | 0 refills | Status: DC

## 2017-10-10 MED ORDER — RANITIDINE HCL 150 MG PO TABS: 150.0000 mg | ORAL_TABLET | Freq: Two times a day (BID) | ORAL | 1 refills | Status: DC

## 2017-10-10 MED ORDER — ROSUVASTATIN CALCIUM 10 MG PO TABS: 10.0000 mg | ORAL_TABLET | Freq: Every day | ORAL | 3 refills | Status: DC

## 2017-10-10 MED ORDER — ALBUTEROL SULFATE (2.5 MG/3ML) 0.083% IN NEBU: 2.5000 mg | INHALATION_SOLUTION | RESPIRATORY_TRACT | 3 refills | Status: DC

## 2017-10-10 MED ORDER — METHOCARBAMOL 500 MG PO TABS: 500.0000 mg | ORAL_TABLET | Freq: Four times a day (QID) | ORAL | 1 refills | Status: DC

## 2017-10-10 MED ORDER — ALBUTEROL SULFATE HFA 108 (90 BASE) MCG/ACT IN AERS: 2.0000 | INHALATION_SPRAY | Freq: Four times a day (QID) | RESPIRATORY_TRACT | 3 refills | Status: DC | PRN

## 2017-10-10 NOTE — Telephone Encounter (Signed)
albuterol (PROVENTIL HFA;VENTOLIN HFA) 108 (90 Base) MCG/ACT inhaler  conjugated estrogens (PREMARIN) vaginal cream  mometasone (NASONEX) 50 MCG/ACT nasal spray, [facility]

## 2017-10-10 NOTE — Progress Notes (Signed)
   CC: Right sided shoulder pain   HPI:  [name] is a 52 y.o. female with history noted below that presents to the acute care clinic for right sided shoulder pain.  Patient states that for the past several weeks she has been experiencing pain in her right shoulder, worse with movement.  She has associated pain in her right arm.  She denies numbness, weakness or radicular pain.  She states she has tried flexeril and heating pads with little benefit.  She states massages help however she can not afford to continue these.  She also reports tightness in her neck.  Past Medical History:  Diagnosis Date  . Allergic rhinitis   . Allergy    seasonal  . Anxiety   . Arthritis    back  . Chronic bronchitis (HCC)   . COPD (chronic obstructive pulmonary disease) (HCC)   . Depression   . Fibromyalgia   . GERD (gastroesophageal reflux disease)   . Hyperlipidemia   . Low back pain     Review of Systems:  Review of Systems  Constitutional: Negative for chills, fever and malaise/fatigue.  Respiratory: Negative for shortness of breath.   Cardiovascular: Negative for orthopnea and leg swelling.  Musculoskeletal: Positive for neck pain. Negative for falls.  Neurological: Negative for tingling and weakness.     Physical Exam:  Vitals:   10/10/17 0954  BP: 131/87  Pulse: (!) 106  Temp: 98.2 F (36.8 C)  TempSrc: Oral  SpO2: 100%  Weight: 150 lb (68 kg)  Height: 5\' 2"  (1.575 m)   Physical Exam  Constitutional: She is well-developed, well-nourished, and in no distress.  Musculoskeletal: She exhibits no edema.  5/5 motor strength in upper and lower extremities bilaterally Abduction to 180 on left and right upper extremity. Pain with abduction on the right side. No bursitis noted Tenderness and tightness to palpation to paraspinal musculature in the cervical and thoracic region     Assessment & Plan:   See encounters tab for problem based medical decision  making.   Patient seen with Dr. Rogelia BogaButcher

## 2017-10-10 NOTE — Telephone Encounter (Signed)
Called [facility] and spoke w/ them, they will call pt and call to have scripts transferred if needed, [facility]

## 2017-10-10 NOTE — Assessment & Plan Note (Signed)
Assessment: GERD Patient reports a history of acid reflux. She states she has tried PPIs in the past however is no longer taking them. She states that she would like something when she has occasional acid reflux.  Plan -Ranitidine

## 2017-10-10 NOTE — Patient Instructions (Addendum)
Briana Parker,  It was a pleasure meeting you today.  Robaxin has been sent to the pharmacy to help with muscle spasm.

## 2017-10-10 NOTE — Assessment & Plan Note (Addendum)
Assessment:  Musculoskeletal pain Patient reports pain on a abduction of right shoulder. Likely rotator cuff tendinitis. Patient also has paraspinal musculature tenderness and tightness in the cervical and thoracic region on the right side.  No red flag symptoms.  Patient states she has tried heating pads and Flexeril with little benefit. She states that massages help. I recommended she do stretches, use ibuprofen and will start robaxin to see if this helps. Will have patient follow-up in 6 weeks  Plan -Robaxin -Ibuprofen  Addendum:  Patient did not tolerate robaxin.  Will try zanaflex

## 2017-10-11 NOTE — Progress Notes (Signed)
Internal Medicine Clinic Attending  Case discussed with Dr. Hoffman at the time of the visit.  We reviewed the resident's history and exam and pertinent patient test results.  I agree with the assessment, diagnosis, and plan of care documented in the resident's note.  

## 2017-10-12 ENCOUNTER — Ambulatory Visit: Payer: Self-pay

## 2017-10-15 ENCOUNTER — Telehealth: Payer: Self-pay | Admitting: Internal Medicine

## 2017-10-15 NOTE — Telephone Encounter (Signed)
Hello, I don't think it was me who saw Ms. Sheptock. It looks like she saw Dr. Mikey BussingHoffman 5 days ago?  Thanks!

## 2017-10-15 NOTE — Telephone Encounter (Signed)
Patient some question about some medicine

## 2017-10-15 NOTE — Telephone Encounter (Signed)
Pt states she is having bad reactions to her new muscle relaxer: Hot flashes Night sweats Hands feel numb, heavy, throbbing, strange feeling Makes R leg hurt She states you told her you would change it if she had problems, please advise

## 2017-10-17 ENCOUNTER — Other Ambulatory Visit: Payer: Self-pay | Admitting: Internal Medicine

## 2017-10-17 MED ORDER — TIZANIDINE HCL 2 MG PO CAPS: 2.0000 mg | ORAL_CAPSULE | Freq: Three times a day (TID) | ORAL | 0 refills | Status: DC

## 2017-10-17 NOTE — Addendum Note (Signed)
Addended by: Geralyn CorwinHOFFMAN, Alsha Meland R on: 10/17/2017 01:33 PM   Modules accepted: Orders

## 2017-10-17 NOTE — Telephone Encounter (Signed)
Please tell Briana Parker to stop her current muscle relaxer and I sent a different one to [facility].  Thanks!

## 2017-10-17 NOTE — Telephone Encounter (Signed)
Gave pt message, she was agreeable

## 2017-11-07 ENCOUNTER — Other Ambulatory Visit: Payer: Self-pay | Admitting: Internal Medicine

## 2017-11-07 NOTE — Telephone Encounter (Signed)
Pt requesting TiZANidine HCl 2 MG refill-wanted to inform MD that it works a lot better than her previously prescribed muscle relaxer.  Will send request to MD.Goldston, Darlene Cassady2/27/201911:19 AM

## 2017-11-07 NOTE — Telephone Encounter (Signed)
Requesting refill on muscle spasm med, pt do not remember the name.

## 2017-11-08 ENCOUNTER — Telehealth: Payer: Self-pay

## 2017-11-08 ENCOUNTER — Other Ambulatory Visit: Payer: Self-pay | Admitting: *Deleted

## 2017-11-08 NOTE — Telephone Encounter (Signed)
Requesting to speak with a nurse about med. Please call back.  

## 2017-11-08 NOTE — Telephone Encounter (Signed)
Spoke w/ pt, sent refill request

## 2017-11-09 ENCOUNTER — Telehealth: Payer: Self-pay | Admitting: *Deleted

## 2017-11-09 NOTE — Telephone Encounter (Signed)
[facility] - stated pt came for refill on Ventolin inhaler, but received 3 inhalers Sep 07, 2017. Wants to know what to do since she's requesting med early? Thanks

## 2017-11-09 NOTE — Telephone Encounter (Signed)
If she has gone through 3 albuterol inhalers that quickly she [facility]. Thanks!

## 2017-11-12 ENCOUNTER — Encounter (INDEPENDENT_AMBULATORY_CARE_PROVIDER_SITE_OTHER): Payer: Self-pay

## 2017-11-12 ENCOUNTER — Telehealth: Payer: Self-pay | Admitting: Internal Medicine

## 2017-11-12 ENCOUNTER — Encounter: Payer: Self-pay | Admitting: Internal Medicine

## 2017-11-12 ENCOUNTER — Ambulatory Visit (INDEPENDENT_AMBULATORY_CARE_PROVIDER_SITE_OTHER): Payer: Self-pay | Admitting: Internal Medicine

## 2017-11-12 DIAGNOSIS — M62838 Other muscle spasm: Secondary | ICD-10-CM

## 2017-11-12 DIAGNOSIS — J329 Chronic sinusitis, unspecified: Secondary | ICD-10-CM

## 2017-11-12 DIAGNOSIS — M25511 Pain in right shoulder: Secondary | ICD-10-CM

## 2017-11-12 MED ORDER — TIZANIDINE HCL 2 MG PO TABS: 2.0000 mg | ORAL_TABLET | Freq: Three times a day (TID) | ORAL | 1 refills | Status: DC

## 2017-11-12 MED ORDER — SALINE SPRAY 0.65 % NA SOLN: 1.0000 | NASAL | 0 refills | Status: DC | PRN

## 2017-11-12 MED ORDER — DM-GUAIFENESIN ER 30-600 MG PO TB12: 1.0000 | ORAL_TABLET | Freq: Two times a day (BID) | ORAL | 0 refills | Status: DC

## 2017-11-12 MED ORDER — SULFAMETHOXAZOLE-TRIMETHOPRIM 800-160 MG PO TABS: 1.0000 | ORAL_TABLET | Freq: Two times a day (BID) | ORAL | 0 refills | Status: DC

## 2017-11-12 NOTE — Progress Notes (Signed)
I saw and evaluated the patient.  I personally confirmed the key portions of Dr. Shanda BumpsAmin's history and exam and reviewed pertinent patient test results.  The assessment, diagnosis, and plan were formulated together and I agree with the documentation in the resident's note.  She noted minimal improvement in her shoulder pain immediately after the injection.  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  If no specific etiology is identified we will consider referral to Sports Medicine who may be able to better assess the shoulder ultrasonographically and better identify an explanation for her symptomatology.  Procedure Note  The risks, benefits, and alternatives to a shoulder injection were reviewed with Briana Parker and she provided consent for the injection.  She gave permission for marking of the right shoulder at the location of the injection based upon bony landmarks.  After a time out, she was prepped in the usual sterile fashion.  Freeze spray was provided to the injection site and a 27 gauge needle was inserted into the subacromial space without difficulty.  Forty mg (1 cc) of kenalog and 1 cc of 1% lidocaine was injected into the subacromial bursa.  The patient tolerated the procedure well without immediate complications.

## 2017-11-12 NOTE — Assessment & Plan Note (Signed)
Her symptoms were more consistent with chronic sinusitis resulting in postnasal drip and cough.  She was given a course of Bactrim for 7 days. Saline nasal spray. Mucinex DM.

## 2017-11-12 NOTE — Patient Instructions (Addendum)
Thank you for visiting clinic today. We will give you a steroid injection in your right shoulder today, I hope it will relieve your pain. I am also giving you a 1 week course of Bactrim, a antibiotic for your sinus. I am also giving you Mucinex DM for your cough and do saline nasal spray multiple times a day. Please follow-up with your primary care according to your scheduled appointment. You can come back and see us if your symptoms does not improve or get worse.

## 2017-11-12 NOTE — Telephone Encounter (Signed)
Patient is calling wanting to speak to nurse regarding medicine

## 2017-11-12 NOTE — Progress Notes (Signed)
   CC: Right shoulder pain and persistent sinusitis.  HPI:  [name] is a 52 y.o. with past medical history as listed below came to the clinic with complaint of persistent sinusitis with yellowish green drainage, postnasal drip, cough with some pleuritic chest pain since January 2019.  She denies any fever or night sweats.  She do get occasional headaches mostly frontal and pressure over maxillary sinuses.  She denies any change in her vision.  Occasionally see blood in her nasal secretions after blowing.  She has tried Sudafed and Nasonex with temporary minimum relief.  She was also complaining of right shoulder pain since November 2018.  She has an history of recurrent muscle spasms.  She is not sure whether that this pain is due to her muscle spasm as it seems different.  She is experiencing pain around right shoulder girdle, occasionally at cervical spine, pain is worse at night, she is having difficulty with her ADLs as pain get worse with any movement around shoulder especially lifting her arm up for dressing and combing, with internal or external rotation and reaching towards the back. She denies any injury.  She denies any fever, erythema or edema of right shoulder. She was given initially Flexeril which never helped, then Robaxin which causes some hot flashes, then tanazidine prescription was sent which she never picked up.  She was using ibuprofen with minimum relief. She was asking about steroid injection.  Past Medical History:  Diagnosis Date  . Allergic rhinitis   . Allergy    seasonal  . Anxiety   . Arthritis    back  . Chronic bronchitis (HCC)   . COPD (chronic obstructive pulmonary disease) (HCC)   . Depression   . Fibromyalgia   . GERD (gastroesophageal reflux disease)   . Hyperlipidemia   . Low back pain    Review of Systems: Negative except mentioned in HPI.  Physical Exam:  Vitals:   11/12/17 1321  BP: 120/69  Pulse: 78  Temp: 98.5 F (36.9 C)    TempSrc: Oral  SpO2: 100%  Weight: 152 lb 6.4 oz (69.1 kg)    General: Vital signs reviewed.  Patient is well-developed and well-nourished, in no acute distress and cooperative with exam.  HEENT.: Normocephalic and atraumatic, mild maxillaries sinus tenderness bilaterally, erythema of bilateral nasal turbinate, no pharyngeal erythema or exudate. Cardiovascular: RRR, S1 normal, S2 normal, no murmurs, gallops, or rubs. Pulmonary/Chest: Clear to auscultation bilaterally, no wheezes, rales, or rhonchi. Abdominal: Soft, non-tender, non-distended, BS +, no masses, organomegaly, or guarding present.  Musculoskeletal: No edema, erythema or hyperthermia of right shoulder, tenderness along lateral and anterior shoulder joint, restricted range of motion especially with abduction, internal and external rotation.  No focal tenderness.  No focal weakness, sensations intact and symmetrical. Extremities: No lower extremity edema bilaterally,  pulses symmetric and intact bilaterally. No cyanosis or clubbing. Skin: Warm, dry and intact. No rashes or erythema. Psychiatric: Normal mood and affect. speech and behavior is normal. Cognition and memory are normal.  Assessment & Plan:   See Encounters Tab for problem based charting.  Patient seen with Dr. Josem KaufmannKlima.

## 2017-11-12 NOTE — Assessment & Plan Note (Addendum)
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 She has not tried tizanidine, as she never picked up her prescription, as it was not with IM program. She was very frustrated because of her inability to perform ADLS and pain interfering with sleep.  She was given a steroid injection in her right shoulder. If her symptoms persist we will get an x-ray of right shoulder.  She will need a referral to sports medicine for further evaluation and management at that time. She was also provided with a new prescription for tinazidine with IM program on it.

## 2017-11-12 NOTE — Telephone Encounter (Signed)
[facility].

## 2017-11-12 NOTE — Telephone Encounter (Signed)
Great.  Thanks

## 2017-11-12 NOTE — Telephone Encounter (Signed)
Ok. Will send in Rx. Would you mind forwarding that to me with the correct pharmacy, [facility]? I will cosign.

## 2017-11-12 NOTE — Telephone Encounter (Signed)
Pt stated she has 1 inhaler left; she called [facility] for refill so she could have it. She has been using inhalers more than usual b/c continues to have some coughing/congestion and her doctor told her she could use it up to 4 times a day. Stated she will call back to schedule an appt ; needs to talk to her daughter first. Also [facility].

## 2017-11-13 ENCOUNTER — Telehealth: Payer: Self-pay | Admitting: Internal Medicine

## 2017-11-13 NOTE — Telephone Encounter (Signed)
Called pt - stated she did not know she had to buy Mucinex DM over the counter. And will call if symptoms do not improve.

## 2017-11-13 NOTE — Telephone Encounter (Signed)
Patient is wanting the physician to call a cough medicine to control cough, the medicine that she had before the pharmacy doesn't have

## 2017-11-13 NOTE — Telephone Encounter (Signed)
I gave her Mucinex DM which is over-the-counter she can get it from any pharmacy.

## 2017-11-19 ENCOUNTER — Other Ambulatory Visit: Payer: Self-pay | Admitting: Internal Medicine

## 2017-11-19 ENCOUNTER — Telehealth: Payer: Self-pay | Admitting: *Deleted

## 2017-11-19 NOTE — Telephone Encounter (Signed)
Patient would like a nurse to call back, patient finish medicine and she isn't feeling better

## 2017-11-19 NOTE — Telephone Encounter (Signed)
Pt calls and states she continues to have a cough, denies fevers, shortness of breath or pain. She is advised to continue consuming liquids, using the otc meds as prescribed and to call if fevers come back or feeling worse, she is agreeable

## 2017-11-20 NOTE — Telephone Encounter (Signed)
This was addressed in separate phone encounter 11/19/2017. Kinnie FeilL. Laketra Bowdish, RN, BSN

## 2017-11-25 ENCOUNTER — Emergency Department (HOSPITAL_COMMUNITY)
Admission: EM | Admit: 2017-11-25 | Discharge: 2017-11-25 | Disposition: A | Discharge status: Home / Self Care | Payer: Self-pay | Attending: Emergency Medicine | Admitting: Emergency Medicine

## 2017-11-25 ENCOUNTER — Emergency Department (HOSPITAL_COMMUNITY): Payer: Self-pay

## 2017-11-25 ENCOUNTER — Encounter (HOSPITAL_COMMUNITY): Payer: Self-pay

## 2017-11-25 DIAGNOSIS — J0101 Acute recurrent maxillary sinusitis: Secondary | ICD-10-CM | POA: Insufficient documentation

## 2017-11-25 DIAGNOSIS — E785 Hyperlipidemia, unspecified: Secondary | ICD-10-CM | POA: Insufficient documentation

## 2017-11-25 DIAGNOSIS — Z79899 Other long term (current) drug therapy: Secondary | ICD-10-CM | POA: Insufficient documentation

## 2017-11-25 DIAGNOSIS — J449 Chronic obstructive pulmonary disease, unspecified: Secondary | ICD-10-CM | POA: Insufficient documentation

## 2017-11-25 MED ORDER — AMOXICILLIN-POT CLAVULANATE 875-125 MG PO TABS
1.0000 | ORAL_TABLET | Freq: Once | ORAL | Status: AC
  Administered 2017-11-25: 1 via ORAL
  Filled 2017-11-25: qty 1

## 2017-11-25 MED ORDER — PREDNISONE 20 MG PO TABS
60.0000 mg | ORAL_TABLET | Freq: Once | ORAL | Status: AC
  Administered 2017-11-25: 60 mg via ORAL
  Filled 2017-11-25: qty 3

## 2017-11-25 MED ORDER — FLUCONAZOLE 150 MG PO TABS: 150.0000 mg | ORAL_TABLET | Freq: Once | ORAL | 0 refills | Status: AC

## 2017-11-25 MED ORDER — AMOXICILLIN-POT CLAVULANATE 875-125 MG PO TABS: 1.0000 | ORAL_TABLET | Freq: Two times a day (BID) | ORAL | 0 refills | Status: AC

## 2017-11-25 MED ORDER — PREDNISONE 10 MG PO TABS: 40.0000 mg | ORAL_TABLET | Freq: Every day | ORAL | 0 refills | Status: AC

## 2017-11-25 NOTE — Discharge Instructions (Signed)
Please take all of your antibiotics until finished!   You may develop abdominal discomfort or diarrhea from the antibiotic.  You may help offset this with probiotics which you can buy or get in yogurt. Do not eat  or take the probiotics until 2 hours after your antibiotic.  If the antibiotic causes significant diarrhea stopped taking it and go to your primary care physician or an urgent care for a different antibiotic.   Start taking prednisone as prescribed beginning tomorrow.  You received the first dose in the emergency department today.  Take [phone] mg of Tylenol every 6 hours as needed for pain/fever.   Drink plenty of water and get plenty of rest.  Use nasal saline sprays or Nasonex for your nasal congestion.  Follow-up with your primary care physician if symptoms persist.  Return to the emergency department if any concerning signs or symptoms develop such as high fevers, vision changes, severe worsening headaches, or worsening chest pain or shortness of breath.

## 2017-11-25 NOTE — ED Triage Notes (Signed)
Pt reports body aches, productive cough, headache x 2 weeks. Reports fevers at home last week.

## 2017-11-25 NOTE — ED Provider Notes (Signed)
[facility] EMERGENCY DEPARTMENT Provider Note   CSN: [id number] Arrival date & time: 11/25/17  4098     History   Chief Complaint No chief complaint on file.   HPI Briana Parker is a 52 y.o. female with history of allergic rhinitis, seasonal allergies, anxiety, COPD, fibromyalgia, GERD, HLD presents today for evaluation of acute onset, progressively worsening nasal congestion and sinus pressure for over 2 weeks.  She notes cough productive of green mucus as well as sore throat.  She notes constant aching frontal headaches and sinus pressure/pain which radiates to the ears occasionally.  She notes occasional shortness of breath which improves with albuterol inhaler and nebulizers which she was using 3 times daily but now only twice daily.  She also notes aching anterior chest wall pain with cough only.  She notes she had a fever of 100.9 F last week.  She is a non-smoker.  She was seen and evaluated by her primary care physician on 11/12/17 and diagnosed with a sinusitis and discharged with Bactrim for 7 days as well as nasal spray which she states has not been helpful.  She also has tried Mucinex and Delsym over-the-counter without relief of her symptoms. She endorses generalized myalgias and multiple sick contacts. The history is provided by the patient.    Past Medical History:  Diagnosis Date  . Allergic rhinitis   . Allergy    seasonal  . Anxiety   . Arthritis    back  . Chronic bronchitis (HCC)   . COPD (chronic obstructive pulmonary disease) (HCC)   . Depression   . Fibromyalgia   . GERD (gastroesophageal reflux disease)   . Hyperlipidemia   . Low back pain     Patient Active Problem List   Diagnosis Date Noted  . Acute pain of right shoulder 10/10/2017  . Gastroesophageal reflux disease without esophagitis 10/10/2017  . Flexor tenosynovitis of finger 04/13/2017  . Prediabetes 04/13/2017  . Hearing loss 09/12/2016  . Chronic low back pain  10/06/2015  . Trigger finger, right 08/11/2015  . Trigger finger, left 03/29/2015  . Carpal tunnel syndrome, bilateral 02/12/2015  . Menopausal syndrome (hot flashes) with mood lability, depression, sleep disturbance, and vaginal dryness  04/15/2014  . Family history of diabetes mellitus 11/10/2013  . Preventative health care 11/10/2013  . Sinusitis, chronic 05/29/2013  . HEMORRHOIDS 05/08/2007  . ASTHMA, EXERCISE INDUCED BRONCHOSPASM 04/09/2007  . Hyperlipidemia 07/13/2006    Past Surgical History:  Procedure Laterality Date  . ABDOMINAL HYSTERECTOMY    . CARPAL TUNNEL RELEASE Right 05/05/2015   Procedure: RIGHT CARPAL TUNNEL RELEASE;  Surgeon: Tarry Kos, MD;  Location: [facility];  Service: Orthopedics;  Laterality: Right;  . TRIGGER FINGER RELEASE Left 05/05/2015   Procedure: LEFT TRIGGER THUMB RELEASE;  Surgeon: Tarry Kos, MD;  Location: [facility];  Service: Orthopedics;  Laterality: Left;  . WISDOM TOOTH EXTRACTION      OB History    No data available       Home Medications    Prior to Admission medications   Medication Sig Start Date End Date Taking? Authorizing Provider  albuterol (PROVENTIL HFA;VENTOLIN HFA) 108 (90 Base) MCG/ACT inhaler Inhale 2 puffs into the lungs 4 (four) times daily as needed for wheezing or shortness of breath. 10/10/17   Geralyn Corwin Ratliff, DO  albuterol (PROVENTIL) (2.5 MG/3ML) 0.083% nebulizer solution Take 3 mLs (2.5 mg total) by nebulization as directed. EVERY 6 HOURS AS NEEDED  FOR WHEEZING OR SHORTNESS OF BREATH. 10/10/17   Geralyn CorwinHoffman, Jessica Ratliff, DO  amoxicillin-clavulanate (AUGMENTIN) 875-125 MG tablet Take 1 tablet by mouth every 12 (twelve) hours for 7 days. 11/25/17 12/02/17  Michela PitcherFawze, Jaydyn Menon A, PA-C  calcium-vitamin D (OSCAL WITH D) 500-200 MG-UNIT per tablet Take 1 tablet by mouth daily with breakfast.    [provider]  conjugated estrogens (PREMARIN) vaginal cream Place vaginally 2 (two)  times a week. 0.5 gram of cream intravaginally administered twice weekly. 10/11/17   Camelia PhenesHoffman, Jessica Ratliff, DO  cyclobenzaprine (FLEXERIL) 10 MG tablet Take 1 tablet (10 mg total) by mouth 3 (three) times daily as needed. IM program $4 04/11/17   Molt, Bethany, DO  dextromethorphan-guaiFENesin (MUCINEX DM) 30-600 MG 12hr tablet Take 1 tablet by mouth 2 (two) times daily. IM program 11/12/17   Arnetha CourserAmin, Sumayya, MD  Diclofenac Sodium (PENNSAID) 2 % SOLN Place nickel size amount to affected area up to 3 times a day as needed for pain 07/10/16   Myra RudeSchmitz, Jeremy E, MD  fluconazole (DIFLUCAN) 150 MG tablet Take 1 tablet (150 mg total) by mouth once for 1 dose. 11/25/17 11/25/17  Michela PitcherFawze, Avalin Briley A, PA-C  gabapentin (NEURONTIN) 300 MG capsule Take 1 capsule (300 mg total) by mouth 3 (three) times daily. 10/10/17   Hoffman, Jessica Ratliff, DO  mometasone (NASONEX) 50 MCG/ACT nasal spray Place 2 sprays into the nose daily. 10/10/17 10/07/18  Geralyn CorwinHoffman, Jessica Ratliff, DO  mometasone-formoterol (DULERA) 100-5 MCG/ACT AERO Inhale 1 puff into the lungs 2 (two) times daily. 04/18/17   Levora DredgeHelberg, Justin, MD  Multiple Vitamins-Minerals (MULTIVITAMIN ADULTS 50+ PO) Take by mouth.    [provider]  naproxen (NAPROSYN) 500 MG tablet Take 1 tablet (500 mg total) by mouth 2 (two) times daily with a meal. 02/14/16   Burns SpainButcher, Elizabeth A, MD  polyethylene glycol (MIRALAX / GLYCOLAX) packet Take 17 g by mouth daily. IM program 04/18/17   Levora DredgeHelberg, Justin, MD  predniSONE (DELTASONE) 10 MG tablet Take 4 tablets (40 mg total) by mouth daily with breakfast for 4 days. 11/25/17 11/29/17  Michela PitcherFawze, Jalynne Persico A, PA-C  ranitidine (ZANTAC) 150 MG tablet Take 1 tablet (150 mg total) by mouth 2 (two) times daily. 10/10/17 10/10/18  Geralyn CorwinHoffman, Jessica Ratliff, DO  rosuvastatin (CRESTOR) 10 MG tablet Take 1 tablet (10 mg total) by mouth daily. IM program, $4 10/10/17   Geralyn CorwinHoffman, Jessica Ratliff, DO  sodium chloride (OCEAN) 0.65 % SOLN nasal spray Place 1 spray into  both nostrils as needed for congestion. IM program 11/12/17   Arnetha CourserAmin, Sumayya, MD  sulfamethoxazole-trimethoprim (BACTRIM DS,SEPTRA DS) 800-160 MG tablet Take 1 tablet by mouth 2 (two) times daily. [facility] Program 11/12/17   Arnetha CourserAmin, Sumayya, MD  tiZANidine (ZANAFLEX) 2 MG tablet Take 1 tablet (2 mg total) by mouth 3 (three) times daily. IM program. 11/12/17   Arnetha CourserAmin, Sumayya, MD    Family History Family History  Problem Relation Age of Onset  . Diabetes Mother        died recently in 01/2010 due to 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  She was on dialysis and died with cardiac arrest as per pt.  . Heart disease Father   . Hypertension Father   . Coronary artery disease Unknown        Strong family history in first degree relatives  . Colon cancer Neg Hx     Social History Social History   Tobacco Use  . Smoking status: Never Smoker  . Smokeless tobacco: Never Used  Substance  Use Topics  . Alcohol use: No    Alcohol/week: 0.0 oz  . Drug use: No     Allergies   Augmentin [amoxicillin-pot clavulanate]; Codeine; Hydrocodone; Nortriptyline; Pravastatin sodium; and Simvastatin   Review of Systems Review of Systems  Constitutional: Positive for fever.  HENT: Positive for congestion, rhinorrhea, sinus pressure, sinus pain and sore throat. Negative for drooling, facial swelling and trouble swallowing.   Respiratory: Positive for cough and shortness of breath.   Cardiovascular: Positive for chest pain (With cough only).  Gastrointestinal: Negative for abdominal pain, nausea and vomiting.  Neurological: Positive for headaches. Negative for syncope.     Physical Exam Updated Vital Signs BP 139/89 (BP Location: Right Arm)   Pulse 96   Temp 99 F (37.2 C) (Oral)   Resp 20   SpO2 97%   Physical Exam  Constitutional: She appears well-developed and well-nourished. No distress.  Resting comfortably in chair  HENT:  Head: Normocephalic and atraumatic.  Right Ear: External ear and ear canal normal. A middle  ear effusion is present.  Left Ear: External ear and ear canal normal. A middle ear effusion is present.  Nose: Mucosal edema present. No septal deviation or nasal septal hematoma. Right sinus exhibits maxillary sinus tenderness and frontal sinus tenderness. Left sinus exhibits maxillary sinus tenderness and frontal sinus tenderness.  Mouth/Throat: Uvula is midline. No trismus in the jaw. No oropharyngeal exudate, posterior oropharyngeal edema or posterior oropharyngeal erythema.  Postnasal drip noted  Eyes: Conjunctivae and EOM are normal. Pupils are equal, round, and reactive to light. Right eye exhibits no discharge. Left eye exhibits no discharge.  Neck: Normal range of motion. Neck supple. No JVD present. No tracheal deviation present.  Bilateral anterior cervical lymphadenopathy  Cardiovascular: Normal rate, regular rhythm and normal heart sounds.  Pulmonary/Chest: Effort normal. She exhibits tenderness.  Equal rise and fall of chest, no increased work of breathing.  Very mild expiratory wheezes scattered throughout the lung fields.  Abdominal: She exhibits no distension.  Musculoskeletal: She exhibits no edema.  Lymphadenopathy:    She has cervical adenopathy.  Neurological: She is alert.  Skin: Skin is warm and dry. No erythema.  Psychiatric: She has a normal mood and affect. Her behavior is normal.  Nursing note and vitals reviewed.    ED Treatments / Results  Labs (all labs ordered are listed, but only abnormal results are displayed) Labs Reviewed - No data to display  EKG  EKG Interpretation None       Radiology Dg Chest 2 View  Result Date: 11/25/2017 CLINICAL DATA:  Pt reports body aches, productive cough, headache x 2 weeks. Reports fevers at home last week. pt states she is also having centralized cp, she is unable to lay down due to it. Hx of chronic bronchitis, COPD. EXAM: CHEST - 2 VIEW COMPARISON:  12/15/2015 FINDINGS: Normal mediastinum and cardiac silhouette.  Chronic central bronchitic markings. Normal pulmonary vasculature. No effusion, infiltrate, or pneumothorax. IMPRESSION: Chronic bronchitic markings at the lung bases. No clear acute findings. Electronically Signed   By: Genevive Bi M.D.   On: 11/25/2017 08:50    Procedures Procedures (including critical care time)  Medications Ordered in ED Medications  amoxicillin-clavulanate (AUGMENTIN) 875-125 MG per tablet 1 tablet (1 tablet Oral Given 11/25/17 1022)  predniSONE (DELTASONE) tablet 60 mg (60 mg Oral Given 11/25/17 1022)     Initial Impression / Assessment and Plan / ED Course  I have reviewed the triage vital signs and the nursing notes.  Pertinent labs & imaging results that were available during my care of the patient were reviewed by me and considered in my medical decision making (see chart for details).     Patient presentation consistent with recurrent sinusitis.  She received Bactrim for 7 days 2 weeks ago with no improvement in her symptoms.  She is currently afebrile and vital signs are stable but she did have a fever at home. Patient complaining of symptoms of sinusitis.  Severe symptoms have been present for greater than 10 days with purulent nasal discharge and maxillary sinus pain.  Concern for acute bacterial rhinosinusitis.  Patient discharged with Augmentin.  She has had diarrhea with this in the past and was instructed to take probiotic or yogurt with this medication.  She was also instructed to discontinue the medication if she does develop any severe diarrhea and present to her primary care doctor or urgent care for a different antibiotic.  I think this is reasonable given the alternative is clindamycin which has a more severe side effect profile. She was agreeable to this.  She did have mild expiratory wheezing scattered on auscultation of the lungs but exhibits no increased work of breathing or hypoxia.  I offered her a breathing treatment but she declined as she has  nebulizers and inhaler at home.  Will give prednisone burst.  First dose given in the ED.  No meningeal signs to suggest meningitis.  Examination is not concerning for PTA or Ludwig's angina.  Chest x-ray shows chronic bronchitic changes but no evidence of pneumonia.  Chest wall pain is reproducible on palpation and does not appear to be cardiac in etiology.  Instructions given for warm saline nasal wash and recommendations for follow-up with primary care physician.  Discussed indications for return to the ED.  Patient and patient's daughter verbalized understanding of and agreement with plan and patient stable for discharge home at this time.    Final Clinical Impressions(s) / ED Diagnoses   Final diagnoses:  Acute recurrent maxillary sinusitis    ED Discharge Orders        Ordered    predniSONE (DELTASONE) 10 MG tablet  Daily with breakfast     11/25/17 1021    amoxicillin-clavulanate (AUGMENTIN) 875-125 MG tablet  Every 12 hours     11/25/17 1021    fluconazole (DIFLUCAN) 150 MG tablet   Once     11/25/17 1021       Jeanie Sewer, PA-C 11/25/17 1028    Loren Racer, MD 11/26/17 [phone]

## 2017-12-19 ENCOUNTER — Ambulatory Visit: Payer: Self-pay

## 2018-01-01 ENCOUNTER — Encounter: Payer: Self-pay | Admitting: Internal Medicine

## 2018-01-10 ENCOUNTER — Ambulatory Visit: Payer: Self-pay

## 2018-01-10 ENCOUNTER — Ambulatory Visit (INDEPENDENT_AMBULATORY_CARE_PROVIDER_SITE_OTHER): Payer: Self-pay | Admitting: Internal Medicine

## 2018-01-10 VITALS — BP 128/76 | HR 90 | Temp 98.8°F | Ht 62.0 in | Wt 144.8 lb

## 2018-01-10 DIAGNOSIS — J45990 Exercise induced bronchospasm: Secondary | ICD-10-CM

## 2018-01-10 DIAGNOSIS — Z7951 Long term (current) use of inhaled steroids: Secondary | ICD-10-CM

## 2018-01-10 DIAGNOSIS — J45901 Unspecified asthma with (acute) exacerbation: Secondary | ICD-10-CM

## 2018-01-10 DIAGNOSIS — M65332 Trigger finger, left middle finger: Secondary | ICD-10-CM

## 2018-01-10 DIAGNOSIS — Z79899 Other long term (current) drug therapy: Secondary | ICD-10-CM

## 2018-01-10 MED ORDER — METHYLPREDNISOLONE 4 MG PO TBPK: ORAL_TABLET | ORAL | 0 refills | Status: DC

## 2018-01-10 NOTE — Patient Instructions (Signed)
I think your symptoms are mostly caused by allergies at this time. I am prescribing a steroid pack that should help decrease this inflammation and cough.

## 2018-01-10 NOTE — Progress Notes (Signed)
   CC: Cough and sore throat  HPI:  [name] is a 52 y.o. female with PMHx detailed below presenting with a weeklong history of increased cough, congestion, and a sore throat.  She has a history of asthma and severe seasonal allergies for which she is already taking Dulera twice daily, Nasonex daily, and loratadine daily.  These are partially helpful but she remains symptomatic.  She is not having much wheezing or shortness of breath.  She is not having any fever.  Her cough is occasionally productive of sputum especially at night.  She also reports pain at her left middle finger.  She thinks this was precipitated by hitting her hand against a table surface a few weeks ago.  She is previously had steroid injection in the left hand by sports medicine clinic.  See problem based assessment and plan below for additional details.  ASTHMA, EXERCISE INDUCED BRONCHOSPASM She is having significant allergic rhinitis and congestion symptoms.  This is exacerbating the cough aspect of her asthma though without prominent wheezing or dyspnea.  She is already on a good regimen with intranasal steroid and systemic antihistamine without adequate relief of symptoms.  Plan: Prescribe Medrol Dosepak for allergy symptoms with a mild exacerbation of asthma  Trigger finger, left This is consistent with flexor tenosynovitis of the left third digit.  There does not appear to be significant stenosis at this time.  She is previously had steroid injection of the thumb for trigger finger.  I recommended she would probably need steroid injection of this digit as well for resolution of symptoms but she is not enthusiastic today. Time: Recommended she follow-up with our clinic or preferably directly to sports medicine center if symptoms progress   Past Medical History:  Diagnosis Date  . Allergic rhinitis   . Allergy    seasonal  . Anxiety   . Arthritis    back  . Chronic bronchitis (HCC)   . COPD  (chronic obstructive pulmonary disease) (HCC)   . Depression   . Fibromyalgia   . GERD (gastroesophageal reflux disease)   . Hyperlipidemia   . Low back pain     Review of Systems: Review of Systems  Constitutional: Negative for fever.  HENT: Positive for congestion.   Eyes: Negative for blurred vision.  Respiratory: Positive for cough and sputum production. Negative for shortness of breath and wheezing.   Cardiovascular: Negative for chest pain.  Gastrointestinal: Negative for heartburn.  Genitourinary: Negative for dysuria.  Musculoskeletal: Negative for myalgias.  Skin: Negative for rash.  Neurological: Negative for dizziness.  Endo/Heme/Allergies: Positive for environmental allergies.     Physical Exam: Vitals:   01/10/18 1110  BP: 128/76  Pulse: 90  Temp: 98.8 F (37.1 C)  TempSrc: Oral  SpO2: 99%  Weight: 144 lb 12.8 oz (65.7 kg)  Height:  (1.575 m)   GENERAL- alert, co-operative, NAD HEENT-conjunctivae noninjected, nasal turbinates appear edematous, no oropharyngeal erythema, no cervical lymphadenopathy CARDIAC- RRR, no murmurs, rubs or gallops. RESP- CTAB, no wheezes or crackles. ABDOMEN- Soft, nontender EXTREMITIES-point tenderness to palpation on the palmar side at the left third MCP joint, no palpable nodule or catching with range of motion SKIN- Warm, dry, No rash or lesion. PSYCH- Normal mood and affect, appropriate thought content and speech.   Assessment & Plan:   See encounters tab for problem based medical decision making.   Patient discussed with Dr. Cyndie Chime

## 2018-01-15 NOTE — Assessment & Plan Note (Signed)
She is having significant allergic rhinitis and congestion symptoms.  This is exacerbating the cough aspect of her asthma though without prominent wheezing or dyspnea.  She is already on a good regimen with intranasal steroid and systemic antihistamine without adequate relief of symptoms.  Plan: Prescribe Medrol Dosepak for allergy symptoms with a mild exacerbation of asthma

## 2018-01-15 NOTE — Progress Notes (Signed)
Medicine attending: Medical history, presenting problems, physical findings, and medications, reviewed with resident physician Dr Christopher Rice on the day of the patient visit and I concur with his evaluation and management plan. 

## 2018-01-15 NOTE — Assessment & Plan Note (Signed)
This is consistent with flexor tenosynovitis of the left third digit.  There does not appear to be significant stenosis at this time.  She is previously had steroid injection of the thumb for trigger finger.  I recommended she would probably need steroid injection of this digit as well for resolution of symptoms but she is not enthusiastic today. Time: Recommended she follow-up with our clinic or preferably directly to sports medicine center if symptoms progress

## 2018-01-16 ENCOUNTER — Ambulatory Visit (INDEPENDENT_AMBULATORY_CARE_PROVIDER_SITE_OTHER): Payer: Self-pay | Admitting: Internal Medicine

## 2018-01-16 ENCOUNTER — Other Ambulatory Visit: Payer: Self-pay

## 2018-01-16 ENCOUNTER — Encounter: Payer: Self-pay | Admitting: Internal Medicine

## 2018-01-16 DIAGNOSIS — M65332 Trigger finger, left middle finger: Secondary | ICD-10-CM

## 2018-01-17 NOTE — Progress Notes (Signed)
Briana Parker presents today for injection of left middle finger trigger finger. This problem was previously discussed with me on 5/2 but could not be performed at that time so she has returned today.  Consent obtained and verified. Time-out conducted. Noted no overlying erythema, induration, or other signs of local infection. Skin prepped in a sterile fashion with alchol wipes. Topical analgesic spray: Ethyl chloride. Joint: flexor tendon sheath proximal to left 3rd MCP Needle: 27g x1" Completed without difficulty. Meds: 0.3cc /mL kenalog, 1cc 1% lidocaine  Advised to call if fevers/chills, erythema, induration, drainage, or persistent bleeding.  Dr. Harmon Dun was present throughout this procedure in a supervising capacity.

## 2018-01-17 NOTE — Assessment & Plan Note (Signed)
Trigger finger injection performed today without complication

## 2018-01-21 NOTE — Progress Notes (Signed)
Internal Medicine Clinic Attending  I saw and evaluated the patient. I was present for the entire procedure.

## 2018-01-24 ENCOUNTER — Other Ambulatory Visit: Payer: Self-pay | Admitting: Internal Medicine

## 2018-02-22 ENCOUNTER — Other Ambulatory Visit: Payer: Self-pay | Admitting: *Deleted

## 2018-02-22 DIAGNOSIS — J45990 Exercise induced bronchospasm: Secondary | ICD-10-CM

## 2018-02-22 MED ORDER — ESTROGENS, CONJUGATED 0.625 MG/GM VA CREA: TOPICAL_CREAM | VAGINAL | 0 refills | Status: DC

## 2018-02-22 MED ORDER — MOMETASONE FURO-FORMOTEROL FUM 100-5 MCG/ACT IN AERO: 1.0000 | INHALATION_SPRAY | Freq: Two times a day (BID) | RESPIRATORY_TRACT | 11 refills | Status: DC

## 2018-03-06 ENCOUNTER — Other Ambulatory Visit: Payer: Self-pay | Admitting: Internal Medicine

## 2018-03-06 DIAGNOSIS — Z0000 Encounter for general adult medical examination without abnormal findings: Secondary | ICD-10-CM

## 2018-03-20 ENCOUNTER — Other Ambulatory Visit: Payer: Self-pay | Admitting: Internal Medicine

## 2018-03-20 MED ORDER — GABAPENTIN 300 MG PO CAPS: 300.0000 mg | ORAL_CAPSULE | Freq: Three times a day (TID) | ORAL | 1 refills | Status: DC

## 2018-03-20 MED ORDER — TIZANIDINE HCL 2 MG PO TABS: 2.0000 mg | ORAL_TABLET | Freq: Three times a day (TID) | ORAL | 1 refills | Status: DC

## 2018-03-20 NOTE — Telephone Encounter (Signed)
Refill Request-patient would like Rx's to be called in @ the [facility] [address] as pt states they no longer carry these medicines on the program at her previous pharmacy. Pt is requesting a call back as well.  gabapentin (NEURONTIN) 300 MG capsule tiZANidine (ZANAFLEX) 2 MG tablet

## 2018-03-20 NOTE — Telephone Encounter (Signed)
Next appt scheduled  8/5 with PCP. 

## 2018-03-21 ENCOUNTER — Telehealth: Payer: Self-pay | Admitting: *Deleted

## 2018-03-21 NOTE — Telephone Encounter (Signed)
Pt called informed her the tizanidine is at [facility] [address]

## 2018-04-02 ENCOUNTER — Encounter: Payer: Self-pay | Admitting: Internal Medicine

## 2018-04-15 ENCOUNTER — Other Ambulatory Visit: Payer: Self-pay

## 2018-04-15 ENCOUNTER — Ambulatory Visit (INDEPENDENT_AMBULATORY_CARE_PROVIDER_SITE_OTHER): Payer: Self-pay | Admitting: Internal Medicine

## 2018-04-15 ENCOUNTER — Encounter: Payer: Self-pay | Admitting: Internal Medicine

## 2018-04-15 ENCOUNTER — Encounter (INDEPENDENT_AMBULATORY_CARE_PROVIDER_SITE_OTHER): Payer: Self-pay

## 2018-04-15 VITALS — BP 122/83 | HR 82 | Temp 98.4°F | Ht 62.0 in | Wt 139.5 lb

## 2018-04-15 DIAGNOSIS — Z79899 Other long term (current) drug therapy: Secondary | ICD-10-CM

## 2018-04-15 DIAGNOSIS — M5442 Lumbago with sciatica, left side: Secondary | ICD-10-CM

## 2018-04-15 DIAGNOSIS — G8929 Other chronic pain: Secondary | ICD-10-CM

## 2018-04-15 DIAGNOSIS — J45909 Unspecified asthma, uncomplicated: Secondary | ICD-10-CM

## 2018-04-15 DIAGNOSIS — F411 Generalized anxiety disorder: Secondary | ICD-10-CM

## 2018-04-15 DIAGNOSIS — H04129 Dry eye syndrome of unspecified lacrimal gland: Secondary | ICD-10-CM

## 2018-04-15 DIAGNOSIS — M545 Low back pain: Secondary | ICD-10-CM

## 2018-04-15 DIAGNOSIS — F429 Obsessive-compulsive disorder, unspecified: Secondary | ICD-10-CM

## 2018-04-15 DIAGNOSIS — N951 Menopausal and female climacteric states: Secondary | ICD-10-CM

## 2018-04-15 DIAGNOSIS — J45990 Exercise induced bronchospasm: Secondary | ICD-10-CM

## 2018-04-15 MED ORDER — ROSUVASTATIN CALCIUM 10 MG PO TABS: 10.0000 mg | ORAL_TABLET | Freq: Every day | ORAL | 3 refills | Status: DC

## 2018-04-15 MED ORDER — ALBUTEROL SULFATE HFA 108 (90 BASE) MCG/ACT IN AERS: 2.0000 | INHALATION_SPRAY | Freq: Four times a day (QID) | RESPIRATORY_TRACT | 3 refills | Status: DC | PRN

## 2018-04-15 MED ORDER — MOMETASONE FUROATE 50 MCG/ACT NA SUSP: 2.0000 | Freq: Every day | NASAL | 6 refills | Status: DC

## 2018-04-15 MED ORDER — GABAPENTIN 300 MG PO CAPS: 300.0000 mg | ORAL_CAPSULE | Freq: Three times a day (TID) | ORAL | 1 refills | Status: DC

## 2018-04-15 MED ORDER — ESTROGENS, CONJUGATED 0.625 MG/GM VA CREA: TOPICAL_CREAM | VAGINAL | 2 refills | Status: DC

## 2018-04-15 MED ORDER — MOMETASONE FURO-FORMOTEROL FUM 100-5 MCG/ACT IN AERO: 1.0000 | INHALATION_SPRAY | Freq: Two times a day (BID) | RESPIRATORY_TRACT | 11 refills | Status: DC

## 2018-04-15 MED ORDER — FLUOXETINE HCL 20 MG PO CAPS: 20.0000 mg | ORAL_CAPSULE | Freq: Every day | ORAL | 2 refills | Status: DC

## 2018-04-15 NOTE — Patient Instructions (Signed)
It was great seeing you today! Thank you for talking with me about your concerns.   Today we are starting Prozac 20mg  at night. This will help with your anxiety, mood, hot flashes and obsessions/compulsions. It will take several weeks to see the full effect at this dose. There is room to increase this medicine if you find it helpful.   Please see me back in 2 months so we can see how you do with the new medication.   I've placed a referral for an eye doctor. They will call you with an appointment.

## 2018-04-16 ENCOUNTER — Encounter: Payer: Self-pay | Admitting: Internal Medicine

## 2018-04-17 NOTE — Progress Notes (Signed)
   CC: follow-up of asthma, chronic back pain, and dry eyes  HPI:  Briana Parker is a 52 y.o. F with medical history as noted below who presents today requesting medication refill and referral to ophthalmology due to persistent and bothersome dry-eyes. She has no other acute complaints but does relay she is unable to afford Tizanidine as it is not on the $4 at [facility] and Flexeril does not work.  For details regarding today's visit and the status of their chronic medical issues, please refer to the assessment and plan.  Past Medical History:  Diagnosis Date  . Allergic rhinitis   . Allergy    seasonal  . Anxiety   . Arthritis    back  . Chronic bronchitis (HCC)   . COPD (chronic obstructive pulmonary disease) (HCC)   . Depression   . Fibromyalgia   . GERD (gastroesophageal reflux disease)   . Hyperlipidemia   . Low back pain    Review of Systems:   General: Denies fevers, chills, weight loss HEENT: Denies acute changes in vision, sore throat, dysphagia Cardiac: Denies CP, SOB, palpitations Pulmonary: Denies cough, wheezes, PND Abd: Denies abdominal pain, changes in bowels Extremities: Denies weakness or swelling Psych: Admits to generalized anxiety ("I worry about everything"), significant anxiety regarding travel (planes, trains, etc), labile moods and obsessive and compulsive behaviors.   Physical Exam: General: Alert, in no acute distress. Pleasant and conversant, open to conversation. HEENT: No icterus, injection or ptosis. No hoarseness or dysarthria  Cardiac: RRR, no MGR appreciated Pulmonary: CTA BL with normal WOB on RA. Able to speak in complete sentences Abd: Soft, non-tender. +bs Extremities: Warm, perfused. No significant pedal edema.   Vitals:   04/15/18 1351  BP: 122/83  Pulse: 82  Temp: 98.4 F (36.9 C)  TempSrc: Oral  SpO2: 100%  Weight: 139 lb 8 oz (63.3 kg)  Height: 5\' 2"  (1.575 m)   Body mass index is 25.51 kg/m.  Assessment  & Plan:   See Encounters Tab for problem based charting.  Patient discussed with Dr. Heide SparkNarendra

## 2018-04-17 NOTE — Assessment & Plan Note (Addendum)
Assessment:  Patient continues to complain of bothersome mood swings and hot flashes which apparently have led to some strain in her relationship. She has been taking Black Cohosh herbal supplements which have helped with her hot flashes but still noting mood swings. When discussing treatment options, we discussed SSRI's and patient was strongly opposed at first however when listing other conditions that anti-depressants are indicated for, she admitted to significant trouble with anxiety, traveling and obsessive/compulsive tendencies. She would not complete a PHQ-9 or GAD-7 today but thanks to her conversation, I suspect she would be mod-severe.      Plan: She is agreeable to trial of Prozac. Will start at 20mg  daily and follow-up in  6-8 weeks. Advised patient it takes several weeks to see the full effects of this medication and not to be discouraged if she doesn't seen results right away.

## 2018-04-17 NOTE — Assessment & Plan Note (Signed)
Assessment:  Patient complains of long history of persistent dry-eyes and dry-mouth which are refractory to OTC solutions. She reports the drops are either "too thick" or make her eyes "burn." She feels her vision is blurry and eyes feel like sand when she rubs them. Reports antihistamines and Nasonex nasal spray dont seem to help.      Plan: Its reasonable to refer her for evaluation of persistent dry eyes. Offered testing for Sjogrens today however declined. Encouraged patient to continue conservative management with OTC drops and anti-histamines.

## 2018-04-17 NOTE — Assessment & Plan Note (Signed)
Assessment:  Patient treated with course of steroids 01/2018 for asthma exacerbation. Responded nicely, patient reports resolution of cough, wheezing and congestion. Lungs are clear. Requests refills of inhalers.      Plan:Refill of Dulera and prn albuterol sent to pharmacy. Pt to continue antihistamines to help reduce likelihood of exacerbation.

## 2018-04-17 NOTE — Assessment & Plan Note (Addendum)
Assessment & Plan: Symptoms improved with Gabapentin 300mg  TID. No changes in symptoms or red flags today. Tells me Tizanidine is too expensive and Flexeril doesn't work for her anymore. Empathized and encouraged her to download GoodRx and let me know if she finds a pharmacy that is affordable. Until then, will continue with conservative therapy.

## 2018-04-18 ENCOUNTER — Telehealth: Payer: Self-pay

## 2018-04-18 NOTE — Telephone Encounter (Signed)
Questions about med, please call pt back.  

## 2018-04-18 NOTE — Progress Notes (Signed)
Internal Medicine Clinic Attending  Case discussed with Dr. Molt at the time of the visit.  We reviewed the resident's history and exam and pertinent patient test results.  I agree with the assessment, diagnosis, and plan of care documented in the resident's note. 

## 2018-04-18 NOTE — Telephone Encounter (Signed)
Tried to call [facility], spoke to pt tues and confirmed meds

## 2018-04-19 NOTE — Telephone Encounter (Addendum)
Patient called in stating she is afraid to start prozac as literature provided by pharmacy on gabapentin states it should not be taken with prozac. Patient would like a call back from PCP L. Leward Quanucatte, RN, BSN

## 2018-04-20 NOTE — Telephone Encounter (Signed)
Discussed issue with patient. Thanks!

## 2018-05-06 ENCOUNTER — Telehealth: Payer: Self-pay | Admitting: Pharmacist

## 2018-05-06 DIAGNOSIS — J45990 Exercise induced bronchospasm: Secondary | ICD-10-CM

## 2018-05-06 DIAGNOSIS — G8929 Other chronic pain: Secondary | ICD-10-CM

## 2018-05-06 DIAGNOSIS — M5442 Lumbago with sciatica, left side: Secondary | ICD-10-CM

## 2018-05-06 DIAGNOSIS — K219 Gastro-esophageal reflux disease without esophagitis: Secondary | ICD-10-CM

## 2018-05-06 DIAGNOSIS — E785 Hyperlipidemia, unspecified: Secondary | ICD-10-CM

## 2018-05-06 DIAGNOSIS — N951 Menopausal and female climacteric states: Secondary | ICD-10-CM

## 2018-05-06 MED ORDER — ESTROGENS, CONJUGATED 0.625 MG/GM VA CREA: TOPICAL_CREAM | VAGINAL | 2 refills | Status: DC

## 2018-05-06 MED ORDER — MOMETASONE FUROATE 50 MCG/ACT NA SUSP: 2.0000 | Freq: Every day | NASAL | 6 refills | Status: DC

## 2018-05-06 MED ORDER — ALBUTEROL SULFATE (2.5 MG/3ML) 0.083% IN NEBU: 2.5000 mg | INHALATION_SOLUTION | RESPIRATORY_TRACT | 3 refills | Status: DC

## 2018-05-06 MED ORDER — ALBUTEROL SULFATE HFA 108 (90 BASE) MCG/ACT IN AERS: 2.0000 | INHALATION_SPRAY | Freq: Four times a day (QID) | RESPIRATORY_TRACT | 3 refills | Status: DC | PRN

## 2018-05-06 MED ORDER — RANITIDINE HCL 150 MG PO TABS: 150.0000 mg | ORAL_TABLET | Freq: Two times a day (BID) | ORAL | 1 refills | Status: DC

## 2018-05-06 MED ORDER — GABAPENTIN 300 MG PO CAPS: 300.0000 mg | ORAL_CAPSULE | Freq: Three times a day (TID) | ORAL | 1 refills | Status: DC

## 2018-05-06 MED ORDER — ATORVASTATIN CALCIUM 20 MG PO TABS: 20.0000 mg | ORAL_TABLET | Freq: Every day | ORAL | 11 refills | Status: DC

## 2018-05-06 MED ORDER — MOMETASONE FURO-FORMOTEROL FUM 100-5 MCG/ACT IN AERO: 1.0000 | INHALATION_SPRAY | Freq: Two times a day (BID) | RESPIRATORY_TRACT | 11 refills | Status: DC

## 2018-05-06 MED ORDER — FLUOXETINE HCL 20 MG PO CAPS: 20.0000 mg | ORAL_CAPSULE | Freq: Every day | ORAL | 2 refills | Status: DC

## 2018-05-06 NOTE — Progress Notes (Signed)
Patient requested transfers to [facility], prescriptions sent.  Rosuvastatin interchanged to atorvastatin according to their formulary. Tizanidine not transferred due to being unavailable---can consider metaxalone as a formulary option.

## 2018-05-09 ENCOUNTER — Telehealth: Payer: Self-pay | Admitting: Internal Medicine

## 2018-05-20 ENCOUNTER — Telehealth: Payer: Self-pay

## 2018-05-20 NOTE — Telephone Encounter (Signed)
Returned call to patient. States she received atorvastatin from [facility] but prefers to stay on Rosuvastatin. She has already called [facility] and they have active refills for her. Also, [place] [facility] unable to fill gabapentin so she will receive this through [facility] as well. She is unable to get tizanidine through [facility] so she will get this through [facility] at [facility]. She will let us know if she requires refill.  Lastly, patient wanted PCP to know that she stopped taking prozac after 2+ weeks due to inability to sleep while taking.  Kinnie Feil, RN, BSN

## 2018-05-20 NOTE — Telephone Encounter (Signed)
Requesting to speak with a nurse about meds. Please call pt back.  

## 2018-05-22 ENCOUNTER — Encounter: Payer: Self-pay | Admitting: Pharmacist

## 2018-05-22 NOTE — Progress Notes (Signed)
Patient was approved for [place] [facility] free program. 

## 2018-05-29 ENCOUNTER — Telehealth: Payer: Self-pay | Admitting: Internal Medicine

## 2018-05-29 NOTE — Telephone Encounter (Signed)
Pt stating the her medication  tiZANidine (ZANAFLEX) 2 MG tablet is not working and that she feels her muscles are tightening up more and wants to know what else can she take.

## 2018-05-31 ENCOUNTER — Ambulatory Visit: Payer: Self-pay

## 2018-05-31 ENCOUNTER — Ambulatory Visit (INDEPENDENT_AMBULATORY_CARE_PROVIDER_SITE_OTHER): Payer: Self-pay | Admitting: *Deleted

## 2018-05-31 DIAGNOSIS — Z23 Encounter for immunization: Secondary | ICD-10-CM

## 2018-06-03 NOTE — Telephone Encounter (Signed)
Hello, I've discussed this with the patient several times. Unfortunately she doesn't seem to respond to any other agent either. She would need to be seen for further discussion. I think I sent her to sports medicine? They may have more tools up their sleeve.

## 2018-06-04 NOTE — Telephone Encounter (Signed)
Attempted to reach patient to discuss note from PCP below. No answer. After many rings recording stated call cannot be completed at this time. Kinnie FeilL. Lott Seelbach, RN, BSN

## 2018-06-04 NOTE — Telephone Encounter (Signed)
Thank you :)

## 2018-06-04 NOTE — Telephone Encounter (Signed)
Pt is calling back, pt never received a call back from previous call. Can someone please call pt [phone]

## 2018-06-04 NOTE — Telephone Encounter (Signed)
Patient returned call. Relayed message from PCP below. Patient states she was previously seen at [facility]. She will call them today [phone]. Kinnie FeilL. Ducatte, RN, BSN

## 2018-07-22 ENCOUNTER — Other Ambulatory Visit: Payer: Self-pay

## 2018-07-22 ENCOUNTER — Encounter: Payer: Self-pay | Admitting: Internal Medicine

## 2018-07-22 ENCOUNTER — Ambulatory Visit (INDEPENDENT_AMBULATORY_CARE_PROVIDER_SITE_OTHER): Payer: Self-pay | Admitting: Internal Medicine

## 2018-07-22 VITALS — BP 112/74 | HR 78 | Temp 99.0°F | Ht 62.0 in | Wt 141.7 lb

## 2018-07-22 DIAGNOSIS — M65352 Trigger finger, left little finger: Secondary | ICD-10-CM | POA: Insufficient documentation

## 2018-07-22 DIAGNOSIS — J449 Chronic obstructive pulmonary disease, unspecified: Secondary | ICD-10-CM

## 2018-07-22 DIAGNOSIS — M25511 Pain in right shoulder: Secondary | ICD-10-CM

## 2018-07-22 DIAGNOSIS — G8929 Other chronic pain: Secondary | ICD-10-CM

## 2018-07-22 DIAGNOSIS — M65332 Trigger finger, left middle finger: Secondary | ICD-10-CM

## 2018-07-22 NOTE — Assessment & Plan Note (Signed)
HPI: The patient continues to have pain in the right shoulder that worsens at night and with movement. She has never had imaging of this joint before. She had a steroid injection in March, which resolved her pain up until 2 months ago when the pain returned. The patient is currently taking tizanidine, but reports this does not help her pain.  Assessment: Full ROM on exam although full ROM elicits pain; no deformities. Ultrasound performed in the room showed no biceps tendinitis. Her pain is most likely caused by rotator cuff tendinitis, but will obtain an xray to rule out any bony abnormalities. Will also perform shoulder injection today.  Procedure: After informed written consent, patient was seated on exam table. Right shoulder was prepped with betadine and ethyl chloride topical analgesic spray. Utilizing lateral approach, patient's right glenohumeral space was injected with 1cc 40mg /mL kenalog, 2cc 1% lidocaine. Patient tolerated the procedure well without immediate complications.  Plan 1. Right shoulder glucocorticoid injection performed today 2. Right shoulder xray

## 2018-07-22 NOTE — Patient Instructions (Signed)
It was a pleasure meeting you today, Ms. Sheptock!  You will hear from radiology about scheduling a shoulder xray. Please call our clinic if you experience fevers or severe redness and pain around the injection sites.  Call us at [phone] if you have any questions.  Thanks, Dr. Avie Arenas

## 2018-07-22 NOTE — Progress Notes (Signed)
   CC: trigger finger and right shoulder pain  HPI:   Briana Parker is a 52 y.o. female with a medical history of COPD and the other medical conditions listed below who presents to the internal medicine clinic for left middle finger trigger finger and right shoulder pain. Please see problem based charting for the status of the patient's current and chronic medical conditions.   Past Medical History:  Diagnosis Date  . Allergic rhinitis   . Allergy    seasonal  . Anxiety   . Arthritis    back  . Chronic bronchitis (HCC)   . COPD (chronic obstructive pulmonary disease) (HCC)   . Depression   . Fibromyalgia   . GERD (gastroesophageal reflux disease)   . Hyperlipidemia   . Low back pain     Review of Systems:   Pertinent positives mentioned in HPI. Remainder of all ROS negative.   Physical Exam: Vitals:   07/22/18 1331  BP: 112/74  Pulse: 78  Temp: 99 F (37.2 C)  TempSrc: Oral  SpO2: 100%  Weight: 141 lb 11.2 oz (64.3 kg)  Height: 5\' 2"  (1.575 m)   Physical Exam  Constitutional: Well-developed, well-nourished, and in no distress.  Eyes: Pupils are equal, round, and reactive to light. EOM are normal.  Ext: Right shoulder without deformities or wounds. Normal ROM although ROM elicits pain with all directional movements. Tenderness to palpation to the anterior shoulder just inferior to the lateral clavicle. On the left palm there is a tender, small nodule along the left middle finger flexor tendon sheath. Skin: Warm and dry. No rashes or wounds.   Assessment & Plan:   See Encounters Tab for problem based charting.  Patient seen with Dr. Cleda Daub

## 2018-07-22 NOTE — Assessment & Plan Note (Addendum)
HPI: [name] has a history of trigger finger of the left middle finger. She had a steroid injection to this area in May, which resolved her symptoms until 2 months ago. Over the past two months, she has noticed increase pain and swelling of the knot in her palm. Her finger also "locks up" frequently, particularly in the morning.   Assessment: Recurrence of trigger finger. Exam shows tender, small nodule on the left palm along the left middle finger flexor tendon sheath. Will perform steroid injection today. Patient may benefit from surgical evaluation if the pain does not resolve or if it recurs quickly.   Procedure: Consent obtained and verified. Time-out conducted. Noted no overlying erythema, induration, or other signs of local infection. Skin prepped in a sterile fashion with alchol wipes. Topical analgesic spray: Ethyl chloride. Joint: flexor tendon sheath proximal to left 3rd MCP Needle: 27g x1" Completed without difficulty. Meds: 0.5cc 40mg /mL kenalog, 1cc 1% lidocaine  Plan 1. Trigger finger injection today 2. Consider hand surgery consult if recurrence

## 2018-07-23 NOTE — Progress Notes (Signed)
Internal Medicine Clinic Attending  I saw and evaluated the patient.  I personally confirmed the key portions of the history and exam documented by Dr. Avie Arenas and I reviewed pertinent patient test results.  The assessment, diagnosis, and plan were formulated together and I agree with the documentation in the resident's note.     I was present for both procedures.  In addition evaluated her right biceps tendon with POCUS> tendon intact no surrounding effusion, suspect mild subacrominal burisits, previous responded well to steroid injection thus it was repeated today.  Agree with assessing joint with Xray.

## 2018-08-26 ENCOUNTER — Ambulatory Visit: Payer: Self-pay

## 2018-08-28 ENCOUNTER — Ambulatory Visit: Payer: Self-pay

## 2018-09-24 ENCOUNTER — Encounter: Payer: Self-pay | Admitting: Internal Medicine

## 2018-10-10 ENCOUNTER — Other Ambulatory Visit: Payer: Self-pay

## 2018-10-10 ENCOUNTER — Ambulatory Visit (INDEPENDENT_AMBULATORY_CARE_PROVIDER_SITE_OTHER): Payer: Self-pay | Admitting: Internal Medicine

## 2018-10-10 VITALS — BP 119/71 | HR 84 | Temp 98.2°F | Ht 62.0 in | Wt 140.0 lb

## 2018-10-10 DIAGNOSIS — M67844 Other specified disorders of tendon, left hand: Secondary | ICD-10-CM

## 2018-10-10 DIAGNOSIS — M7662 Achilles tendinitis, left leg: Secondary | ICD-10-CM | POA: Insufficient documentation

## 2018-10-10 DIAGNOSIS — M65332 Trigger finger, left middle finger: Secondary | ICD-10-CM

## 2018-10-10 DIAGNOSIS — E785 Hyperlipidemia, unspecified: Secondary | ICD-10-CM

## 2018-10-10 DIAGNOSIS — M62838 Other muscle spasm: Secondary | ICD-10-CM

## 2018-10-10 DIAGNOSIS — R7303 Prediabetes: Secondary | ICD-10-CM

## 2018-10-10 DIAGNOSIS — M7661 Achilles tendinitis, right leg: Secondary | ICD-10-CM

## 2018-10-10 LAB — POCT GLYCOSYLATED HEMOGLOBIN (HGB A1C): HEMOGLOBIN A1C: 5.8 % — AB (ref 4.0–5.6)

## 2018-10-10 LAB — GLUCOSE, CAPILLARY: GLUCOSE-CAPILLARY: 80 mg/dL (ref 70–99)

## 2018-10-10 NOTE — Patient Instructions (Addendum)
For your shoulder muscle tension: Use heat and icy hot throughout the day Use Aspercreme (with trolamine salicylate) over the counter 2-3 times per day Make sure you have a good supportive pillow when sleeping  For your trigger finger, make an appointment to try the injection again.  For your heel pain, use ice/heat, massage, and make sure to stretch   I'll call you with the results of your tests.    Achilles Tendinitis Rehab Ask your health care provider which exercises are safe for you. Do exercises exactly as told by your health care provider and adjust them as directed. It is normal to feel mild stretching, pulling, tightness, or discomfort as you do these exercises, but you should stop right away if you feel sudden pain or your pain gets worse. Do not begin these exercises until told by your health care provider. Stretching and range of motion exercises These exercises warm up your muscles and joints and improve the movement and flexibility of your ankle. These exercises also help to relieve pain, numbness, and tingling. Exercise A: Standing wall calf stretch, knee straight  1. Stand with your hands against a wall. 2. Extend your __________ leg behind you and bend your front knee slightly. Keep both of your heels on the floor. 3. Point the toes of your back foot slightly inward. 4. Keeping your heels on the floor and your back knee straight, shift your weight toward the wall. Do not allow your back to arch. You should feel a gentle stretch in your calf. 5. Hold this position for seconds. Repeat __________ times. Complete this stretch __________ times per day. Exercise B: Standing wall calf stretch, knee bent 1. Stand with your hands against a wall. 2. Extend your __________ leg behind you, and bend your front knee slightly. Keep both of your heels on the floor. 3. Point the toes of your back foot slightly inward. 4. Keeping your heels on the floor, unlock your back knee so that it  is bent. You should feel a gentle stretch deep in your calf. 5. Hold this position for __________ seconds. Repeat __________ times. Complete this stretch __________ times per day. Strengthening exercises These exercises build strength and control of your ankle. Endurance is the ability to use your muscles for a long time, even after they get tired. Exercise C: Plantar flexion with band  1. Sit on the floor with your __________ leg extended. You may put a pillow under your calf to give your foot more room to move. 2. Loop a rubber exercise band or tube around the ball of your __________ foot. The ball of your foot is on the walking surface, right under your toes. The band or tube should be slightly tense when your foot is relaxed. If the band or tube slips, you can put on your shoe or put a washcloth between the band and your foot to help it stay in place. 3. Slowly point your toes downward, pushing them away from you. 4. Hold this position for __________ seconds. 5. Slowly release the tension in the band or tube, controlling smoothly until your foot is back to the starting position. Repeat __________ times. Complete this exercise __________ times per day. Exercise D: Heel raise with eccentric lower  1. Stand on a step with the balls of your feet. The ball of your foot is on the walking surface, right under your toes. ? Do not put your heels on the step. ? For balance, rest your hands on the  wall or on a railing. 2. Rise up onto the balls of your feet. 3. Keeping your heels up, shift all of your weight to your __________ leg and pick up your other leg. 4. Slowly lower your __________ leg so your heel drops below the level of the step. 5. Put down your foot. If told by your health care provider, build up to:  3 sets of 15 repetitions while keeping your knees straight.  3 sets of 15 repetitions while keeping your knees bent as far as told by your health care provider. Complete this exercise  __________ times per day. If this exercise is too easy, try doing it while wearing a backpack with weights in it. Balance exercises These exercises improve or maintain your balance. Balance is important in preventing falls. Exercise E: Single leg stand 1. Without shoes, stand near a railing or in a door frame. Hold on to the railing or door frame as needed. 2. Stand on your __________ foot. Keep your big toe down on the floor and try to keep your arch lifted. 3. Hold this position for __________ seconds. Repeat __________ times. Complete this exercise __________ times per day. If this exercise is too easy, you can try it with your eyes closed or while standing on a pillow. This information is not intended to replace advice given to you by your health care provider. Make sure you discuss any questions you have with your health care provider. Document Released: 03/29/2005 Document Revised: 02/12/2017 Document Reviewed: 05/04/2015 Elsevier Interactive Patient Education  2019 [employer].

## 2018-10-10 NOTE — Assessment & Plan Note (Signed)
Patient wants repeat A1c. She denies polyuria or polydipsia.   Plan: --A1c today 5.8 - still in pre-diabetes range but improved

## 2018-10-10 NOTE — Progress Notes (Signed)
   CC: trigger finger  HPI:  [name] is a 54 y.o. with a PMH listed below presenting for evaluation of trigger finger, bil heel pain, and bil shoulder pain.  Patient with h/o trigger finger involving left 3th digit; she underwent injection at last visit which she states did not help. She feels the injection was too lateral. She continues to have a tendon cyst as well as nighttime locking of her finger.   Patient endorses several weeks h/o bil heel pain with burning sensation; she denies trauma. She is very active and has continued to remain active despite the pain.  Patient also endorses months long h/o muscle tension in her shoulders which she states was not completely resolved with muscle relaxants. She uses massage, OTC creams and heat which do help however in a day or two her symptoms return.   Please see problem based Assessment and Plan for status of patients chronic conditions.  Past Medical History:  Diagnosis Date  . Allergic rhinitis   . Allergy    seasonal  . Anxiety   . Arthritis    back  . Chronic bronchitis (HCC)   . COPD (chronic obstructive pulmonary disease) (HCC)   . Depression   . Fibromyalgia   . GERD (gastroesophageal reflux disease)   . Hyperlipidemia   . Low back pain     Review of Systems:   Per HPI  Physical Exam:  Vitals:   10/10/18 0927  BP: 119/71  Pulse: 84  Temp: 98.2 F (36.8 C)  TempSrc: Oral  SpO2: 100%  Weight: 140 lb (63.5 kg)  Height: 5\' 2"  (1.575 m)   GENERAL- alert, co-operative, appears as stated age, not in any distress. EXTREMITIES- pulse 2+ PT, symmetric, no pedal edema.  TTP of bil achilles tendon insertion points; no tenderness or abnormality noted in the rest of achilles tendon or calves. Strength intact at ankles.  Muscle tension in bil traps/subscapularis mm Left 3rd digit with tendon sheath cysts  Assessment & Plan:   See Encounters Tab for problem based charting.   Patient discussed with Dr.  Argentina Ponder, MD Internal Medicine PGY-3

## 2018-10-10 NOTE — Assessment & Plan Note (Signed)
Patient with continued trigger finger. She wishes to try steroid injection again.  Plan: --f/u in [facility] when Dr. Mikey Bussing available for procedure --may need to consider referral to hand surgery if no improvement and if financially feasible for patient

## 2018-10-10 NOTE — Assessment & Plan Note (Signed)
Patient with bil achilles tendinitis w/o evidence of tendon rupture or significant injury.   Plan: --advised use of stretching, massage, ice, and aspercreme

## 2018-10-10 NOTE — Assessment & Plan Note (Addendum)
Patient stopped atorvastatin; she wants to see how changing her diet and increasing activity would affect her cholesterol.  Plan: --f/u lipid panel and assess CVD risk to see if she requires statin - ASCVD risk low (2.5% in 10 yrs) so no statin recommended at this time; continue lifestyle changes

## 2018-10-10 NOTE — Assessment & Plan Note (Signed)
Patient with evidence of muscle tension in bil shoulders. She notices that she tenses up her shoulder throughout the day and has to be cognizant about relaxing her arms. She describes good posture habits otherwise. It appears she does have relief with her current treatment, however her habit of tensing up her shoulders leads to recurrent symptoms.  Plan: --advised regular use of heat, massage, aspercreme, stretches

## 2018-10-11 LAB — LIPID PANEL
Chol/HDL Ratio: 4.1 ratio (ref 0.0–4.4)
Cholesterol, Total: 243 mg/dL — ABNORMAL HIGH (ref 100–199)
HDL: 60 mg/dL (ref >39)
LDL Calculated: 152 mg/dL — ABNORMAL HIGH (ref 0–99)
Triglycerides: 155 mg/dL — ABNORMAL HIGH (ref 0–149)
VLDL CHOLESTEROL CAL: 31 mg/dL (ref 5–40)

## 2018-10-11 NOTE — Progress Notes (Signed)
Medicine attending: Medical history, presenting problems, physical findings, and medications, reviewed with resident physician Dr Gorica Svalina on the day of the patient visit and I concur with her evaluation and management plan. 

## 2018-10-15 ENCOUNTER — Ambulatory Visit: Payer: Self-pay

## 2018-10-18 ENCOUNTER — Ambulatory Visit (INDEPENDENT_AMBULATORY_CARE_PROVIDER_SITE_OTHER): Payer: Self-pay | Admitting: Internal Medicine

## 2018-10-18 ENCOUNTER — Other Ambulatory Visit: Payer: Self-pay

## 2018-10-18 ENCOUNTER — Encounter: Payer: Self-pay | Admitting: Internal Medicine

## 2018-10-18 VITALS — BP 129/76 | HR 93 | Temp 98.4°F | Ht 62.0 in | Wt 139.8 lb

## 2018-10-18 DIAGNOSIS — E785 Hyperlipidemia, unspecified: Secondary | ICD-10-CM

## 2018-10-18 DIAGNOSIS — M65332 Trigger finger, left middle finger: Secondary | ICD-10-CM

## 2018-10-18 DIAGNOSIS — M62838 Other muscle spasm: Secondary | ICD-10-CM

## 2018-10-18 MED ORDER — ROSUVASTATIN CALCIUM 10 MG PO TABS: 10.0000 mg | ORAL_TABLET | Freq: Every day | ORAL | 2 refills | Status: DC

## 2018-10-18 MED ORDER — CYCLOBENZAPRINE HCL 5 MG PO TABS: 5.0000 mg | ORAL_TABLET | Freq: Three times a day (TID) | ORAL | 0 refills | Status: AC | PRN

## 2018-10-18 NOTE — Assessment & Plan Note (Signed)
Assessment/plan: Ms. exam is adamant about restarting her cholesterol medication.  I have educated her on her low ASCVD risk but I do believe that it is reasonable to restart this medication.  I have sent a prescription for Crestor 10 mg daily.

## 2018-10-18 NOTE — Progress Notes (Signed)
   CC: Left middle trigger finger  HPI:  BrianaBriana Parker is a 53 y.o. female who presents with trigger finger.  Left middle trigger finger: Since May, 2019 she has had multiple reoccurrences of left middle trigger finger. She has had relief with steroid injections (last one being 07/22/2018). Today she presents with pain in the left middle finger. She is interested in steroid injection today.   Muscle spasms of shoulder region: She was seen in clinic for a several month history of muscle spasms on 10/10/2018.  She was advised to use regular use of heat, massage, Aspercreme, and stretches.  She states that she has done all of the above except for massages with little improvement in her pain.  She states that her muscle spasms are worse on her right shoulder area.  She has tried a muscle relaxer years ago which did alleviate her symptoms but believes that she "got used to it because of long-term use" and it no longer worked.  She denies any upper extremity weakness, sensory changes, pain in any other areas.  Hyperlipidemia: She was on Crestor 10 mg for many years up until 1 year ago when she stopped (at that she did not need to take it anymore).  He denied any side effects to this medication.  She had a recent repeat lipid panel and ASCVD risk was low (2.5% in 10 years).  Statin use was not recommended at this time.  She is very concerned about not taking a cholesterol medication I would like to restart it.  Past Medical History:  Diagnosis Date  . Allergic rhinitis   . Allergy    seasonal  . Anxiety   . Arthritis    back  . Chronic bronchitis (HCC)   . COPD (chronic obstructive pulmonary disease) (HCC)   . Depression   . Fibromyalgia   . GERD (gastroesophageal reflux disease)   . Hyperlipidemia   . Low back pain    Review of Systems:   Review of Systems  Musculoskeletal: Positive for neck pain.       Right shoulder pain.  Pain at the base of the left middle finger.  All other  systems reviewed and are negative.   Physical Exam:  Vitals:   10/18/18 1317  BP: 129/76  Pulse: 93  Temp: 98.4 F (36.9 C)  TempSrc: Oral  SpO2: 100%  Weight: 139 lb 12.8 oz (63.4 kg)  Height: 5\' 2"  (1.575 m)   Physical Exam Vitals signs reviewed.  Constitutional:      Appearance: Normal appearance.  HENT:     Head: Normocephalic and atraumatic.  Neck:     Comments: Tenderness to palpation of the right trapezius muscle.  Very tight muscles of the right shoulder.  Normal range of motion of the neck.  Musculoskeletal:     Comments: Small hard nodule at the base of the left middle finger which is tender to palpation.  Cannot palpate a locking sensation at the base as well.   Neurological:     Mental Status: She is alert.     Assessment & Plan:   See Encounters Tab for problem based charting.  Patient discussed with Dr. Heide Spark

## 2018-10-18 NOTE — Assessment & Plan Note (Signed)
Assessment/plan: She continues to have muscle spasms of the right shoulders region despite use of heat and Aspercreme.  I will prescribe a short course of cyclobenzaprine.  I have also encouraged her to get a massage but this will likely help her very tight muscles.

## 2018-10-18 NOTE — Assessment & Plan Note (Signed)
Assessment/plan: She continues to have a left middle trigger finger with pain despite 2 separate steroid injections.  She needed to go to work and could not stay for trigger finger injection.  She will set up a follow-up appointment with Dr. Mikey Bussing is in.  I do think that she would benefit from hand surgery referral.  Unfortunately at this time there are no open referrals for orange card.  Please consider referring at next clinic visit.

## 2018-10-21 ENCOUNTER — Ambulatory Visit (INDEPENDENT_AMBULATORY_CARE_PROVIDER_SITE_OTHER): Payer: Self-pay | Admitting: Internal Medicine

## 2018-10-21 ENCOUNTER — Encounter: Payer: Self-pay | Admitting: Internal Medicine

## 2018-10-21 ENCOUNTER — Other Ambulatory Visit: Payer: Self-pay

## 2018-10-21 VITALS — BP 138/77 | HR 93 | Temp 98.0°F | Ht 62.0 in | Wt 140.7 lb

## 2018-10-21 DIAGNOSIS — M62838 Other muscle spasm: Secondary | ICD-10-CM

## 2018-10-21 DIAGNOSIS — M542 Cervicalgia: Secondary | ICD-10-CM

## 2018-10-21 DIAGNOSIS — M65332 Trigger finger, left middle finger: Secondary | ICD-10-CM

## 2018-10-21 DIAGNOSIS — M25511 Pain in right shoulder: Secondary | ICD-10-CM

## 2018-10-21 NOTE — Addendum Note (Signed)
Addended by: Earl Lagos on: 10/21/2018 09:38 AM   Modules accepted: Level of Service

## 2018-10-21 NOTE — Progress Notes (Signed)
Internal Medicine Clinic Attending  Case discussed with Dr. Prince at the time of the visit.  We reviewed the resident's history and exam and pertinent patient test results.  I agree with the assessment, diagnosis, and plan of care documented in the resident's note.   

## 2018-10-21 NOTE — Assessment & Plan Note (Signed)
Assessment/plan: She presented for trigger finger steroid injection of the left middle finger.  This procedure was performed today without complication (please see procedure note).  If her symptoms do not improve or return she will need to be referred to a hand surgeon for further surgical management.

## 2018-10-21 NOTE — Assessment & Plan Note (Signed)
Assessment/plan: She continues to have right shoulder muscle spasms which I do believe are musculoskeletal in nature.  I recommended that she continue the cyclobenzaprine course for total of 2 weeks.  She should also add ibuprofen (taken with meals) for its anti-inflammatory effects.  I recommended that if her muscle spasms do not improve over this 2-week period that she should follow-up in clinic.

## 2018-10-21 NOTE — Progress Notes (Signed)
   CC: Left middle trigger finger  HPI:  Briana Parker is a 53 y.o. female who presents with trigger finger.  Left middle trigger finger: Since May 2019 she has had multiple recurrence of left middle trigger finger.  She has had some relief with steroid injections (last one being 07/22/2018).  Today she presents with continued pain in the left middle finger.  Right shoulder region muscle spasm: She was seen in clinic 3 days ago with continued right shoulder spasms.  I prescribed a two-week course of cyclobenzaprine which she has been taking for the last several days.  She does continue to have right shoulder pain with little relief from the muscle relaxant.  She usually tries Tylenol.  She states that she can only take ibuprofen with an acid reducer.  She did take her sisters 800 mg ibuprofen which did help with her symptoms.    Past Medical History:  Diagnosis Date  . Allergic rhinitis   . Allergy    seasonal  . Anxiety   . Arthritis    back  . Chronic bronchitis (HCC)   . COPD (chronic obstructive pulmonary disease) (HCC)   . Depression   . Fibromyalgia   . GERD (gastroesophageal reflux disease)   . Hyperlipidemia   . Low back pain    Review of Systems:   Review of Systems  Musculoskeletal:       Left middle finger pain and locking with movement.  All other systems reviewed and are negative.   Physical Exam:  There were no vitals filed for this visit. Physical Exam Vitals signs reviewed.  Constitutional:      Appearance: Normal appearance.  HENT:     Head: Normocephalic and atraumatic.  Musculoskeletal:     Comments: Small 0.5 cm x 0.5 cm round nodule at the base of the left middle finger.  Mild tenderness to palpation of the round nodule.  Locking sensation palpated when patient flexes the middle finger.  Right trapezius muscle tightness and tenderness to palpation.  No tenderness to palpation of the spine.  Popping sound at the right glenohumeral joint with arm  movement.  Skin:    General: Skin is warm and dry.  Neurological:     Mental Status: She is alert and oriented to person, place, and time. Mental status is at baseline.  Psychiatric:        Mood and Affect: Mood normal.        Behavior: Behavior normal.     Assessment & Plan:   See Encounters Tab for problem based charting.  Patient seen with Dr. Cleda Daub

## 2018-10-21 NOTE — Progress Notes (Signed)
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  All questions were answered.    The flexor surface of the left middle finger was cleaned with alcohol swabs. Using a 27 gauge 1 inch needle the flexor tendon was entered, confirmed by movement of the finger. The needle was withdrawn until resistance on the plunger decreased. The tendon sheath space was then injected with 0.5 mL of Triamcinolone and 1 mL of 1% Lidocaine without Epi. The needle was withdrawn, the site was cleaned and covered with a band aid.   The patient did tolerate the procedure well and there were not complications.

## 2018-10-21 NOTE — Progress Notes (Signed)
Internal Medicine Clinic Attending  I saw and evaluated the patient.  I personally confirmed the key portions of the history and exam documented by Dr. Prince and I reviewed pertinent patient test results.  The assessment, diagnosis, and plan were formulated together and I agree with the documentation in the resident's note. I was present for the entire procedure. 

## 2018-11-11 ENCOUNTER — Telehealth: Payer: Self-pay | Admitting: Internal Medicine

## 2018-11-11 ENCOUNTER — Other Ambulatory Visit: Payer: Self-pay | Admitting: Internal Medicine

## 2018-11-11 DIAGNOSIS — M62838 Other muscle spasm: Secondary | ICD-10-CM

## 2018-11-11 DIAGNOSIS — E785 Hyperlipidemia, unspecified: Secondary | ICD-10-CM

## 2018-11-11 NOTE — Telephone Encounter (Signed)
Needs refill on muscle relaxer  [facility] - [place], [place] - [address]. ;pt contact [phone]

## 2018-11-11 NOTE — Telephone Encounter (Signed)
Pt was prescribed Flexeril by Dr Criss Alvine on 2/7.

## 2018-11-12 MED ORDER — CYCLOBENZAPRINE HCL 5 MG PO TABS: 5.0000 mg | ORAL_TABLET | Freq: Three times a day (TID) | ORAL | 0 refills | Status: AC | PRN

## 2018-11-12 NOTE — Telephone Encounter (Signed)
Will provide short term prescription until she has a chance to meet up with me.

## 2018-11-22 ENCOUNTER — Other Ambulatory Visit: Payer: Self-pay | Admitting: *Deleted

## 2018-11-22 DIAGNOSIS — N951 Menopausal and female climacteric states: Secondary | ICD-10-CM

## 2018-11-22 MED ORDER — ESTROGENS, CONJUGATED 0.625 MG/GM VA CREA: TOPICAL_CREAM | VAGINAL | 2 refills | Status: DC

## 2018-12-09 ENCOUNTER — Other Ambulatory Visit: Payer: Self-pay | Admitting: Internal Medicine

## 2018-12-13 ENCOUNTER — Other Ambulatory Visit: Payer: Self-pay | Admitting: Internal Medicine

## 2018-12-16 NOTE — Telephone Encounter (Signed)
I prescribed this prior for short term. I will refill again until I have a chance to see her.

## 2018-12-25 ENCOUNTER — Telehealth: Payer: Self-pay | Admitting: *Deleted

## 2018-12-25 NOTE — Telephone Encounter (Signed)
NURSING TRIAGE NOTE FOR RESPIRATORY SYMPTOMS  Do you have a fever?  "no" Do you have a cough?  "no" Do you have shortness of breath more than normal? "no" Do you have chest pain?"no" Are you able to eat and drink normally? "yes" Have you seen a physician for these symptoms? "no"  Action: pt has rash under breast, bothering her greatly, she has appt in clinic 4/16 at 1045. Cautioned her to wear mask, cleanse hands often, avoid touching any surfaces as much as possible. If she develops cough, fever, chest pain, short of breath, unable to eat, drink to please isolate self and notify clinic. She verb knowledge of all. appt 4/16 at 1045  I informed patient to expect a phone call from a physician soon and I sent request to front desk to schedule a virtual office appt for patient and arrive the patient.

## 2018-12-26 ENCOUNTER — Ambulatory Visit: Payer: Self-pay

## 2019-02-05 ENCOUNTER — Other Ambulatory Visit: Payer: Self-pay

## 2019-02-05 ENCOUNTER — Ambulatory Visit: Payer: Self-pay

## 2019-02-12 ENCOUNTER — Other Ambulatory Visit: Payer: Self-pay | Admitting: Internal Medicine

## 2019-02-12 NOTE — Telephone Encounter (Signed)
Now that we are seeing in clinic. I would like to see her in clinic before I refill this prescription to better assess her shoulder issue

## 2019-02-24 ENCOUNTER — Other Ambulatory Visit: Payer: Self-pay | Admitting: Internal Medicine

## 2019-02-24 DIAGNOSIS — E785 Hyperlipidemia, unspecified: Secondary | ICD-10-CM

## 2019-02-25 ENCOUNTER — Encounter: Payer: Self-pay | Admitting: Internal Medicine

## 2019-02-25 ENCOUNTER — Other Ambulatory Visit: Payer: Self-pay

## 2019-02-25 ENCOUNTER — Ambulatory Visit (INDEPENDENT_AMBULATORY_CARE_PROVIDER_SITE_OTHER): Payer: Self-pay | Admitting: Internal Medicine

## 2019-02-25 VITALS — BP 123/70 | HR 86 | Temp 98.2°F | Ht 62.0 in | Wt 140.9 lb

## 2019-02-25 DIAGNOSIS — Z79899 Other long term (current) drug therapy: Secondary | ICD-10-CM

## 2019-02-25 DIAGNOSIS — J45990 Exercise induced bronchospasm: Secondary | ICD-10-CM

## 2019-02-25 DIAGNOSIS — G8929 Other chronic pain: Secondary | ICD-10-CM

## 2019-02-25 DIAGNOSIS — M25511 Pain in right shoulder: Secondary | ICD-10-CM

## 2019-02-25 DIAGNOSIS — E785 Hyperlipidemia, unspecified: Secondary | ICD-10-CM

## 2019-02-25 DIAGNOSIS — Z7951 Long term (current) use of inhaled steroids: Secondary | ICD-10-CM

## 2019-02-25 MED ORDER — ROSUVASTATIN CALCIUM 10 MG PO TABS: 10.0000 mg | ORAL_TABLET | Freq: Every day | ORAL | 11 refills | Status: DC

## 2019-02-25 MED ORDER — CYCLOBENZAPRINE HCL 5 MG PO TABS: ORAL_TABLET | ORAL | 0 refills | Status: DC

## 2019-02-25 MED ORDER — MOMETASONE FUROATE 50 MCG/ACT NA SUSP: 2.0000 | Freq: Every day | NASAL | 6 refills | Status: DC

## 2019-02-25 MED ORDER — MOMETASONE FURO-FORMOTEROL FUM 100-5 MCG/ACT IN AERO: 1.0000 | INHALATION_SPRAY | Freq: Two times a day (BID) | RESPIRATORY_TRACT | 11 refills | Status: DC

## 2019-02-25 MED ORDER — ALBUTEROL SULFATE HFA 108 (90 BASE) MCG/ACT IN AERS: 2.0000 | INHALATION_SPRAY | Freq: Four times a day (QID) | RESPIRATORY_TRACT | 3 refills | Status: DC | PRN

## 2019-02-25 NOTE — Progress Notes (Signed)
   CC: Medication Refills, Shoulder Pain  HPI:  [name] is a 53 y.o. F with PMHx listed below presenting for Medication Refills, Shoulder Pain. Please see the A&P for the status of the patient's chronic medical problems.   Past Medical History:  Diagnosis Date  . Allergic rhinitis   . Allergy    seasonal  . Anxiety   . Arthritis    back  . Chronic bronchitis ([place])   . COPD (chronic obstructive pulmonary disease) ([place])   . Depression   . Fibromyalgia   . GERD (gastroesophageal reflux disease)   . Hyperlipidemia   . Low back pain    Review of Systems:  Performed and all others negative.  Physical Exam:  Vitals:   02/25/19 0933  BP: 123/70  Pulse: 86  Temp: 98.2 F (36.8 C)  TempSrc: Oral  SpO2: 100%  Weight: 140 lb 14.4 oz (63.9 kg)  Height: 5\' 2"  (1.575 m)   Physical Exam Constitutional:      General: She is not in acute distress.    Appearance: Normal appearance.  Cardiovascular:     Rate and Rhythm: Normal rate and regular rhythm.     Pulses: Normal pulses.     Heart sounds: Normal heart sounds.  Pulmonary:     Effort: Pulmonary effort is normal. No respiratory distress.     Breath sounds: Normal breath sounds.  Abdominal:     General: Bowel sounds are normal. There is no distension.     Palpations: Abdomen is soft.     Tenderness: There is no abdominal tenderness.  Musculoskeletal:        General: No swelling or deformity.     Comments: Tight, firm trapezius musculature R > L Full ROM but some pain at extremes of ROM  Skin:    General: Skin is warm and dry.  Neurological:     General: No focal deficit present.     Mental Status: Mental status is at baseline.    Assessment & Plan:   See Encounters Tab for problem based charting.  Patient discussed with Dr. Angelia Mould

## 2019-02-25 NOTE — Assessment & Plan Note (Signed)
Patient presenting today for refills, no SOB.  - Refill Dulera and Albuterol

## 2019-02-25 NOTE — Assessment & Plan Note (Signed)
Patient presenting for re-evaluation and refill of flexeril. She has chronic R shoulder pain with tightness and spasms of her trapezius and shoulder musculature. Pain is typically worse at night and with use. She has had 2 steroid injections in the past ~1 year that have resolved her pain for months at a time. There is question if she has recurrent bursitis/tendonitis vs underlying boney abnormalities. Xray had been ordered previously but has not been performed. She is unable to afford PT as she is self pay - 1 time refill of flexeril as she works on exercises and get Xray - Xray as planned - Exercises and stretches provided with AVS - Follow up as needed, can consider repeat injection if indicated

## 2019-02-25 NOTE — Patient Instructions (Signed)
Thank you for allowing us to care for you  You were provided refills today  For your pain - One time refill of flexeril today - Try provided exercises, chronic muscle relaxants are not a good option - You have an Xray ordered for your shoulder - We can consider another injection if needed   Shoulder Exercises Ask your health care provider which exercises are safe for you. Do exercises exactly as told by your health care provider and adjust them as directed. It is normal to feel mild stretching, pulling, tightness, or discomfort as you do these exercises, but you should stop right away if you feel sudden pain or your pain gets worse.Do not begin these exercises until told by your health care provider. Range of Motion Exercises        These exercises warm up your muscles and joints and improve the movement and flexibility of your shoulder. These exercises also help to relieve pain, numbness, and tingling. These exercises involve stretching your injured shoulder directly. Exercise A: Pendulum 1. Stand near a wall or a surface that you can hold onto for balance. 2. Bend at the waist and let your left / right arm hang straight down. Use your other arm to support you. Keep your back straight and do not lock your knees. 3. Relax your left / right arm and shoulder muscles, and move your hips and your trunk so your left / right arm swings freely. Your arm should swing because of the motion of your body, not because you are using your arm or shoulder muscles. 4. Keep moving your body so your arm swings in the following directions, as told by your health care provider: ? Side to side. ? Forward and backward. ? In clockwise and counterclockwise circles. 5. Continue each motion for __________ seconds, or for as long as told by your health care provider. 6. Slowly return to the starting position. Repeat __________ times. Complete this exercise __________ times a day. Exercise B:Flexion, Standing  1. Stand and hold a broomstick, a cane, or a similar object. Place your hands a little more than shoulder-width apart on the object. Your left / right hand should be palm-up, and your other hand should be palm-down. 2. Keep your elbow straight and keep your shoulder muscles relaxed. Push the stick down with your healthy arm to raise your left / right arm in front of your body, and then over your head until you feel a stretch in your shoulder. ? Avoid shrugging your shoulder while you raise your arm. Keep your shoulder blade tucked down toward the middle of your back. 3. Hold for __________ seconds. 4. Slowly return to the starting position. Repeat __________ times. Complete this exercise __________ times a day. Exercise C: Abduction, Standing 1. Stand and hold a broomstick, a cane, or a similar object. Place your hands a little more than shoulder-width apart on the object. Your left / right hand should be palm-up, and your other hand should be palm-down. 2. While keeping your elbow straight and your shoulder muscles relaxed, push the stick across your body toward your left / right side. Raise your left / right arm to the side of your body and then over your head until you feel a stretch in your shoulder. ? Do not raise your arm above shoulder height, unless your health care provider tells you to do that. ? Avoid shrugging your shoulder while you raise your arm. Keep your shoulder blade tucked down toward the middle of  your back. 3. Hold for __________ seconds. 4. Slowly return to the starting position. Repeat __________ times. Complete this exercise __________ times a day. Exercise D:Internal Rotation 1. Place your left / right hand behind your back, palm-up. 2. Use your other hand to dangle an exercise band, a towel, or a similar object over your shoulder. Grasp the band with your left / right hand so you are holding onto both ends. 3. Gently pull up on the band until you feel a stretch in the  front of your left / right shoulder. ? Avoid shrugging your shoulder while you raise your arm. Keep your shoulder blade tucked down toward the middle of your back. 4. Hold for __________ seconds. 5. Release the stretch by letting go of the band and lowering your hands. Repeat __________ times. Complete this exercise __________ times a day. Stretching Exercises  These exercises warm up your muscles and joints and improve the movement and flexibility of your shoulder. These exercises also help to relieve pain, numbness, and tingling. These exercises are done using your healthy shoulder to help stretch the muscles of your injured shoulder. Exercise E: Research officer, political partyCorner Stretch (External Rotation and Abduction) 1. Stand in a doorway with one of your feet slightly in front of the other. This is called a staggered stance. If you cannot reach your forearms to the door frame, stand facing a corner of a room. 2. Choose one of the following positions as told by your health care provider: ? Place your hands and forearms on the door frame above your head. ? Place your hands and forearms on the door frame at the height of your head. ? Place your hands on the door frame at the height of your elbows. 3. Slowly move your weight onto your front foot until you feel a stretch across your chest and in the front of your shoulders. Keep your head and chest upright and keep your abdominal muscles tight. 4. Hold for __________ seconds. 5. To release the stretch, shift your weight to your back foot. Repeat __________ times. Complete this stretch __________ times a day. Exercise F:Extension, Standing 1. Stand and hold a broomstick, a cane, or a similar object behind your back. ? Your hands should be a little wider than shoulder-width apart. ? Your palms should face away from your back. 2. Keeping your elbows straight and keeping your shoulder muscles relaxed, move the stick away from your body until you feel a stretch in your  shoulder. ? Avoid shrugging your shoulders while you move the stick. Keep your shoulder blade tucked down toward the middle of your back. 3. Hold for __________ seconds. 4. Slowly return to the starting position. Repeat __________ times. Complete this exercise __________ times a day. Strengthening Exercises           These exercises build strength and endurance in your shoulder. Endurance is the ability to use your muscles for a long time, even after they get tired. Exercise G:External Rotation 1. Sit in a stable chair without armrests. 2. Secure an exercise band at elbow height on your left / right side. 3. Place a soft object, such as a folded towel or a small pillow, between your left / right upper arm and your body to move your elbow a few inches away (about 10 cm) from your side. 4. Hold the end of the band so it is tight and there is no slack. 5. Keeping your elbow pressed against the soft object, move your left / right  forearm out, away from your abdomen. Keep your body steady so only your forearm moves. 6. Hold for __________ seconds. 7. Slowly return to the starting position. Repeat __________ times. Complete this exercise __________ times a day. Exercise H:Shoulder Abduction 1. Sit in a stable chair without armrests, or stand. 2. Hold a __________ weight in your left / right hand, or hold an exercise band with both hands. 3. Start with your arms straight down and your left / right palm facing in, toward your body. 4. Slowly lift your left / right hand out to your side. Do not lift your hand above shoulder height unless your health care provider tells you that this is safe. ? Keep your arms straight. ? Avoid shrugging your shoulder while you do this movement. Keep your shoulder blade tucked down toward the middle of your back. 5. Hold for __________ seconds. 6. Slowly lower your arm, and return to the starting position. Repeat __________ times. Complete this exercise  __________ times a day. Exercise I:Shoulder Extension 1. Sit in a stable chair without armrests, or stand. 2. Secure an exercise band to a stable object in front of you where it is at shoulder height. 3. Hold one end of the exercise band in each hand. Your palms should face each other. 4. Straighten your elbows and lift your hands up to shoulder height. 5. Step back, away from the secured end of the exercise band, until the band is tight and there is no slack. 6. Squeeze your shoulder blades together as you pull your hands down to the sides of your thighs. Stop when your hands are straight down by your sides. Do not let your hands go behind your body. 7. Hold for __________ seconds. 8. Slowly return to the starting position. Repeat __________ times. Complete this exercise __________ times a day. Exercise J:Standing Shoulder Row 1. Sit in a stable chair without armrests, or stand. 2. Secure an exercise band to a stable object in front of you so it is at waist height. 3. Hold one end of the exercise band in each hand. Your palms should be in a thumbs-up position. 4. Bend each of your elbows to an "L" shape (about 90 degrees) and keep your upper arms at your sides. 5. Step back until the band is tight and there is no slack. 6. Slowly pull your elbows back behind you. 7. Hold for __________ seconds. 8. Slowly return to the starting position. Repeat __________ times. Complete this exercise __________ times a day. Exercise K:Shoulder Press-Ups 1. Sit in a stable chair that has armrests. Sit upright, with your feet flat on the floor. 2. Put your hands on the armrests so your elbows are bent and your fingers are pointing forward. Your hands should be about even with the sides of your body. 3. Push down on the armrests and use your arms to lift yourself off of the chair. Straighten your elbows and lift yourself up as much as you comfortably can. ? Move your shoulder blades down, and avoid letting  your shoulders move up toward your ears. ? Keep your feet on the ground. As you get stronger, your feet should support less of your body weight as you lift yourself up. 4. Hold for __________ seconds. 5. Slowly lower yourself back into the chair. Repeat __________ times. Complete this exercise __________ times a day. Exercise L: Wall Push-Ups 1. Stand so you are facing a stable wall. Your feet should be about one arm-length away from the wall.  2. Lean forward and place your palms on the wall at shoulder height. 3. Keep your feet flat on the floor as you bend your elbows and lean forward toward the wall. 4. Hold for __________ seconds. 5. Straighten your elbows to push yourself back to the starting position. Repeat __________ times. Complete this exercise __________ times a day. This information is not intended to replace advice given to you by your health care provider. Make sure you discuss any questions you have with your health care provider. Document Released: 07/12/2005 Document Revised: 01/01/2018 Document Reviewed: 05/09/2015 Elsevier Interactive Patient Education  2019 [employer]

## 2019-02-25 NOTE — Assessment & Plan Note (Signed)
Patient presenting for refills. She has been taking Crestor without side effects. - Crestor Refilled

## 2019-02-28 ENCOUNTER — Other Ambulatory Visit: Payer: Self-pay

## 2019-02-28 ENCOUNTER — Ambulatory Visit (HOSPITAL_COMMUNITY)
Admission: RE | Admit: 2019-02-28 | Discharge: 2019-02-28 | Disposition: A | Discharge status: Home / Self Care | Payer: Self-pay | Source: Ambulatory Visit | Attending: Internal Medicine | Admitting: Internal Medicine

## 2019-02-28 DIAGNOSIS — G8929 Other chronic pain: Secondary | ICD-10-CM | POA: Insufficient documentation

## 2019-02-28 DIAGNOSIS — M25511 Pain in right shoulder: Secondary | ICD-10-CM | POA: Insufficient documentation

## 2019-03-03 ENCOUNTER — Encounter: Payer: Self-pay | Admitting: Internal Medicine

## 2019-03-03 NOTE — Progress Notes (Signed)
Internal Medicine Clinic Attending  Case discussed with Dr. Melvin  at the time of the visit.  We reviewed the resident's history and exam and pertinent patient test results.  I agree with the assessment, diagnosis, and plan of care documented in the resident's note.  

## 2019-03-03 NOTE — Progress Notes (Signed)
Patient sent letter informing her of her normal xray results.

## 2019-04-15 ENCOUNTER — Other Ambulatory Visit: Payer: Self-pay | Admitting: Internal Medicine

## 2019-04-15 MED ORDER — CYCLOBENZAPRINE HCL 5 MG PO TABS: ORAL_TABLET | ORAL | 0 refills | Status: DC

## 2019-04-15 NOTE — Addendum Note (Signed)
Addended by: Mosetta Anis on: 04/15/2019 11:54 AM   Modules accepted: Orders

## 2019-04-15 NOTE — Telephone Encounter (Signed)
Dr Truman Hayward,  Can you resend this rx IM as requested by pharmacy .Regenia Skeeter, Jennfier Abdulla Cassady8/4/202011:18 AM

## 2019-04-15 NOTE — Addendum Note (Signed)
Addended by: Marcelino Duster on: 04/15/2019 11:20 AM   Modules accepted: Orders

## 2019-05-20 ENCOUNTER — Ambulatory Visit (INDEPENDENT_AMBULATORY_CARE_PROVIDER_SITE_OTHER): Payer: Self-pay | Admitting: *Deleted

## 2019-05-20 ENCOUNTER — Other Ambulatory Visit: Payer: Self-pay

## 2019-05-20 ENCOUNTER — Ambulatory Visit: Payer: Self-pay

## 2019-05-20 DIAGNOSIS — Z23 Encounter for immunization: Secondary | ICD-10-CM

## 2019-06-23 ENCOUNTER — Other Ambulatory Visit: Payer: Self-pay | Admitting: Internal Medicine

## 2019-06-23 DIAGNOSIS — M62838 Other muscle spasm: Secondary | ICD-10-CM

## 2019-06-23 NOTE — Telephone Encounter (Signed)
Spoke with pt.  Patient declined an appt at this time and is requesting call back about why she needs to be seen.

## 2019-07-21 ENCOUNTER — Ambulatory Visit: Payer: Self-pay

## 2019-07-22 ENCOUNTER — Other Ambulatory Visit: Payer: Self-pay

## 2019-07-22 ENCOUNTER — Encounter: Payer: Self-pay | Admitting: Internal Medicine

## 2019-07-22 ENCOUNTER — Ambulatory Visit: Payer: Self-pay | Admitting: Internal Medicine

## 2019-07-22 VITALS — BP 123/77 | HR 85 | Temp 98.0°F | Ht 62.0 in | Wt 141.5 lb

## 2019-07-22 DIAGNOSIS — M62838 Other muscle spasm: Secondary | ICD-10-CM

## 2019-07-22 DIAGNOSIS — G8929 Other chronic pain: Secondary | ICD-10-CM

## 2019-07-22 DIAGNOSIS — K219 Gastro-esophageal reflux disease without esophagitis: Secondary | ICD-10-CM

## 2019-07-22 DIAGNOSIS — M25511 Pain in right shoulder: Secondary | ICD-10-CM

## 2019-07-22 DIAGNOSIS — J309 Allergic rhinitis, unspecified: Secondary | ICD-10-CM

## 2019-07-22 DIAGNOSIS — E785 Hyperlipidemia, unspecified: Secondary | ICD-10-CM

## 2019-07-22 DIAGNOSIS — Z79899 Other long term (current) drug therapy: Secondary | ICD-10-CM

## 2019-07-22 DIAGNOSIS — M545 Low back pain: Secondary | ICD-10-CM

## 2019-07-22 DIAGNOSIS — J449 Chronic obstructive pulmonary disease, unspecified: Secondary | ICD-10-CM

## 2019-07-22 DIAGNOSIS — J45990 Exercise induced bronchospasm: Secondary | ICD-10-CM

## 2019-07-22 DIAGNOSIS — F419 Anxiety disorder, unspecified: Secondary | ICD-10-CM

## 2019-07-22 MED ORDER — ALBUTEROL SULFATE (2.5 MG/3ML) 0.083% IN NEBU: 2.5000 mg | INHALATION_SOLUTION | RESPIRATORY_TRACT | 6 refills | Status: DC

## 2019-07-22 MED ORDER — DULOXETINE HCL 30 MG PO CPEP: 30.0000 mg | ORAL_CAPSULE | Freq: Every day | ORAL | 3 refills | Status: DC

## 2019-07-22 MED ORDER — MOMETASONE FUROATE 50 MCG/ACT NA SUSP: 2.0000 | Freq: Every day | NASAL | 6 refills | Status: DC

## 2019-07-22 MED ORDER — CYCLOBENZAPRINE HCL 10 MG PO TABS: ORAL_TABLET | ORAL | 6 refills | Status: DC

## 2019-07-22 MED ORDER — MOMETASONE FURO-FORMOTEROL FUM 100-5 MCG/ACT IN AERO: 1.0000 | INHALATION_SPRAY | Freq: Two times a day (BID) | RESPIRATORY_TRACT | 3 refills | Status: DC

## 2019-07-22 MED ORDER — ROSUVASTATIN CALCIUM 10 MG PO TABS: 10.0000 mg | ORAL_TABLET | Freq: Every day | ORAL | 3 refills | Status: DC

## 2019-07-22 MED ORDER — ALBUTEROL SULFATE HFA 108 (90 BASE) MCG/ACT IN AERS: 2.0000 | INHALATION_SPRAY | Freq: Four times a day (QID) | RESPIRATORY_TRACT | 6 refills | Status: DC | PRN

## 2019-07-22 MED ORDER — CALCIUM CARBONATE-VITAMIN D 500-200 MG-UNIT PO TABS: 1.0000 | ORAL_TABLET | Freq: Every day | ORAL | 3 refills | Status: DC

## 2019-07-22 MED ORDER — OMEPRAZOLE 20 MG PO CPDR: 20.0000 mg | DELAYED_RELEASE_CAPSULE | Freq: Every day | ORAL | 3 refills | Status: DC

## 2019-07-22 NOTE — Progress Notes (Signed)
CC: Shoulder pain  HPI: Briana Parker is a 53 y.o. F w/ PMH of GERD, anxiety, copd, hld and allergic rhinitis presenting w/ complaint of chronic pain. She mentions that she has had 8/10 crampy pain in various extremities for the past year. She was taking her flexeril at home and mentions that it helps her go to sleep but pain has been constant. She also mentions taking her duloxetine as prescribed w/o significant benefits. She mentions that she is also endorsing pain in her legs as well as her back and neck. She denies any fevers, chills, nausea, vomiting. Denies any hx of trauma or fall.  Past Medical History:  Diagnosis Date  . Allergic rhinitis   . Allergy    seasonal  . Anxiety   . Arthritis    back  . Chronic bronchitis ([place])   . COPD (chronic obstructive pulmonary disease) ([place])   . Depression   . Fibromyalgia   . GERD (gastroesophageal reflux disease)   . Hyperlipidemia   . Low back pain     Review of Systems: Review of Systems  Constitutional: Negative for chills and fever.  Cardiovascular: Negative for chest pain, palpitations and leg swelling.  Gastrointestinal: Negative for constipation, diarrhea, nausea and vomiting.  Musculoskeletal: Positive for back pain, joint pain, myalgias and neck pain.  Neurological: Negative for dizziness and weakness.  All other systems reviewed and are negative.    Physical Exam: Vitals:   07/22/19 1030 07/22/19 1032  BP:  123/77  Pulse:  85  Temp:  98 F (36.7 C)  TempSrc:  Oral  SpO2:  100%  Weight: 141 lb 8 oz (64.2 kg)   Height: 5\' 2"  (1.575 m)     Physical Exam  Constitutional: She appears well-developed and well-nourished. No distress.  HENT:  Head: Normocephalic and atraumatic.  Cardiovascular: Normal rate, regular rhythm, normal heart sounds and intact distal pulses.  No murmur heard. Respiratory: Effort normal and breath sounds normal. She has no wheezes. She has no rales.  Musculoskeletal: Normal range of  motion.        General: Tenderness (Focal tenderness on anterior aspect of R shoulder) present.     Comments: R shoulder w/ intact active/passive ROM. Negative empty beer can test. Negative Neer test. Tense trapezius muscle    Assessment & Plan:   Gastroesophageal reflux disease without esophagitis Pt requires refills on medications with associated diagnosis above.  Reviewed disease process and find this medication to be necessary, will not change dose or alter current therapy.  Asthma, exercise induced Pt requires refills on medications with associated diagnosis above.  Reviewed disease process and find this medication to be necessary, will not change dose or alter current therapy.  Chronic low back pain Continuing to endorse low back pain. Despite flexeril use. Vitamin D refilled as requested  Chronic right shoulder pain Presents for management of continued shoulder pain. Mentions mild improvement w/ flexeril but no significant resolution. Also complaining of pain in variety of locations. Prior work-up with X-ray negative. Physical exam does not demonstrate any tendon / rotator cuff injury. Wondering if fibromyalgia may be on the differential as she scores 9/19 on WPI fibromyalgia scale. Does not meet criteria but is on treatment anyway with duloxetine and cyclobenzaprine. Discussed side effects of prolonged flexeril use. Briana Parker expressed understanding.  - C/w flexeril + duloxetine - Will do formal WPI + SS scale on next visit to definitively work-up fibromyalgia    Patient discussed with Dr. Rebeca Alert   -  Gilberto Better, PGY2 [facility] Internal Medicine Pager: [phone]

## 2019-07-28 ENCOUNTER — Encounter: Payer: Self-pay | Admitting: Internal Medicine

## 2019-07-28 NOTE — Assessment & Plan Note (Signed)
Presents for management of continued shoulder pain. Mentions mild improvement w/ flexeril but no significant resolution. Also complaining of pain in variety of locations. Prior work-up with X-ray negative. Physical exam does not demonstrate any tendon / rotator cuff injury. Wondering if fibromyalgia may be on the differential as she scores 9/19 on WPI fibromyalgia scale. Does not meet criteria but is on treatment anyway with duloxetine and cyclobenzaprine. Discussed side effects of prolonged flexeril use. Briana Parker expressed understanding.  - C/w flexeril + duloxetine - Will do formal WPI + SS scale on next visit to definitively work-up fibromyalgia

## 2019-07-28 NOTE — Assessment & Plan Note (Signed)
Continuing to endorse low back pain. Despite flexeril use. Vitamin D refilled as requested

## 2019-07-28 NOTE — Assessment & Plan Note (Signed)
Pt requires refills on medications with associated diagnosis above.  Reviewed disease process and find this medication to be necessary, will not change dose or alter current therapy. 

## 2019-07-28 NOTE — Progress Notes (Signed)
Internal Medicine Clinic Attending  Case discussed with Dr. Lee at the time of the visit.  We reviewed the resident's history and exam and pertinent patient test results.  I agree with the assessment, diagnosis, and plan of care documented in the resident's note.  Alexander Raines, M.D., Ph.D.  

## 2019-07-30 ENCOUNTER — Ambulatory Visit: Payer: Self-pay

## 2019-10-08 ENCOUNTER — Ambulatory Visit: Payer: Self-pay

## 2019-10-08 ENCOUNTER — Other Ambulatory Visit: Payer: Self-pay

## 2019-10-09 ENCOUNTER — Telehealth: Payer: Self-pay | Admitting: Internal Medicine

## 2019-10-09 NOTE — Telephone Encounter (Signed)
Sounds good. Thank you

## 2019-10-09 NOTE — Telephone Encounter (Signed)
Talked pt - stated having neck cramps and popping; requesting an appt for a neck injection. Asked if we had done this before; stated yes. Talked to Dr Johny Shears - looking thru pt's chart; she had trigger finger and shoulder injections in the past. Talking to pt again - she stated "Oh yeah it was my shoulder, not my neck". Dr Mikey Bussing will be Attending next week; [facility] appt scheduled 2/2 @ 0845 AM.

## 2019-10-09 NOTE — Telephone Encounter (Signed)
Pt requesting a call back [phone]

## 2019-10-14 ENCOUNTER — Ambulatory Visit: Payer: Self-pay | Admitting: Internal Medicine

## 2019-10-14 ENCOUNTER — Other Ambulatory Visit: Payer: Self-pay

## 2019-10-14 ENCOUNTER — Encounter: Payer: Self-pay | Admitting: Internal Medicine

## 2019-10-14 DIAGNOSIS — M25511 Pain in right shoulder: Secondary | ICD-10-CM

## 2019-10-14 DIAGNOSIS — M65332 Trigger finger, left middle finger: Secondary | ICD-10-CM

## 2019-10-14 DIAGNOSIS — G8929 Other chronic pain: Secondary | ICD-10-CM

## 2019-10-14 DIAGNOSIS — M25512 Pain in left shoulder: Secondary | ICD-10-CM

## 2019-10-14 NOTE — Patient Instructions (Signed)
[name],   We did a shoulder and trigger finger injection today.   Thanks for seeing [place].

## 2019-10-14 NOTE — Assessment & Plan Note (Addendum)
Shoulder pain: [name] has a chronic history of right shoulder pain as well as neck pain and has received several steroid injections in the past namely July 22, 2018 and November 12, 2017 which provided much relief for her.  She has had a shoulder x-ray which was unremarkable.  She states that sometimes her neck and shoulders feel tight.  There was concern for fibromyalgia and was recently started on duloxetine though she stated that she took the medicine just for a week and stopped taking it.  A steroid injection was performed at Left shoulder using 1% plain Lidocaine and 1cc 40mg  of Kenalog. This was well tolerated.  Plan: -She is to return to the clinic prn for shoulder pain.  -Continue flexeril and duloxetine

## 2019-10-14 NOTE — Assessment & Plan Note (Addendum)
Left trigger finger pain: Her last injection was October 21, 2018.   A steroid injection was performed at Left trigger finger using 1% plain Lidocaine and 0.5cc  20 mg of Kenalog. This was well tolerated.  Plan: -She is to return to the clinic prn for pain.

## 2019-10-14 NOTE — Progress Notes (Signed)
Internal Medicine Clinic Attending  I saw and evaluated the patient.  I personally confirmed the key portions of the history and exam documented by Dr. Dortha Schwalbe and I reviewed pertinent patient test results.  The assessment, diagnosis, and plan were formulated together and I agree with the documentation in the resident's note.    I was present for both procedures.  To clarify dosing, 2cc of 1% lidocaine and 1cc of 40mg  kenalog was injected into the lateral aspect of the left shoulder using a 25guage 1.5inch.  1cc of 1% lidocaine with 0.5cc (20mg ) of kenalog was injected into the left third tendon sheath using a 27 gauge needle.

## 2019-10-14 NOTE — Progress Notes (Signed)
   CC: Left shoulder pain, Left middle finger finger pain  HPI:  [name] is a 54 y.o. with a history of chronic shoulder pain and trigger finger pain presenting for steroid injections.  Please see problem based charting for further details.  Past Medical History:  Diagnosis Date  . Allergic rhinitis   . Allergy    seasonal  . Anxiety   . Arthritis    back  . Chronic bronchitis (HCC)   . COPD (chronic obstructive pulmonary disease) (HCC)   . Depression   . Fibromyalgia   . GERD (gastroesophageal reflux disease)   . Hyperlipidemia   . Low back pain    Review of Systems:  As per HPI  Physical Exam:  Vitals:   10/14/19 0845  BP: 132/83  Pulse: (!) 103  Temp: 98.1 F (36.7 C)  TempSrc: Oral  SpO2: 100%  Weight: 140 lb 14.4 oz (63.9 kg)  Height: 5\' 2"  (1.575 m)   Physical Exam  Constitutional: She is well-developed, well-nourished, and in no distress.  HENT:  Head: Normocephalic and atraumatic.  Musculoskeletal:     Comments: -Left shoulder ROM intact though tender with Abduction past 90 degrees  -Left trigger finger with fibrotic knot     Assessment & Plan:   See Encounters Tab for problem based charting.  Patient seen with Dr. 

## 2019-11-03 ENCOUNTER — Telehealth: Payer: Self-pay

## 2019-11-03 NOTE — Telephone Encounter (Signed)
Requesting to speak with a nurse about itching on the face, and want to know what to use. Please call pt back.

## 2019-11-03 NOTE — Telephone Encounter (Signed)
rtc to pt, she wants an appt to get a derm referral for her face 2/23 at [facility] [place]

## 2019-11-04 ENCOUNTER — Ambulatory Visit: Payer: Self-pay | Admitting: Internal Medicine

## 2019-11-04 ENCOUNTER — Encounter: Payer: Self-pay | Admitting: Internal Medicine

## 2019-11-04 ENCOUNTER — Other Ambulatory Visit: Payer: Self-pay

## 2019-11-04 VITALS — BP 134/90 | HR 88 | Temp 98.1°F | Ht 62.0 in | Wt 134.1 lb

## 2019-11-04 DIAGNOSIS — L299 Pruritus, unspecified: Secondary | ICD-10-CM

## 2019-11-04 DIAGNOSIS — R21 Rash and other nonspecific skin eruption: Secondary | ICD-10-CM

## 2019-11-04 NOTE — Assessment & Plan Note (Signed)
Patient presents for evaluation of a facial rash that has been present for the past 4 months. The rash was initially present on bilateral cheeks and has now progressed to involve the corners of her mouth.  It is associated with pruritus and burning.  She denies recent changes in medications, diet, make-up products, facial products, and toothpaste.  Denies systemic signs of infection.  She was evaluated for this in November and was recommended to use hydrocortisone.  Unfortunately hydrocortisone cream has not been helping and she is requesting referral to dermatology.  On exam, she has nondemarcated hyperpigmented areas around the corners of the mouth that is worse on the left.  Similar rash also present in bilateral cheeks.  Pictures on media tab.  It is not clear to me what the etiology of this rash is but differential diagnosis includes irritant contact dermatitis, melasma, rosacea, perioral dermatitis, etc.  - Continue Cetaphil use  - Switch to a nonfluorinated toothpaste - Avoid changes in diet, keep a food diary  - Avoid make-up use and strong cleansers  - vitamin C serum to help with hyperpigmentation  - Avoidance of sun exposure and use of sunscreen  - Dermatology referral

## 2019-11-04 NOTE — Progress Notes (Signed)
   CC: Facial rash   HPI:  [name] is a 54 y.o. year-old female with PMH listed below who presents to clinic for facial rash. Please see problem based assessment and plan for further details.   Past Medical History:  Diagnosis Date  . Allergic rhinitis   . Allergy    seasonal  . Anxiety   . Arthritis    back  . Chronic bronchitis (HCC)   . COPD (chronic obstructive pulmonary disease) (HCC)   . Depression   . Fibromyalgia   . GERD (gastroesophageal reflux disease)   . Hyperlipidemia   . Low back pain    Review of Systems:   Review of Systems  Constitutional: Negative for chills, fever, malaise/fatigue and weight loss.  Skin: Positive for itching and rash.       Burning     Physical Exam:  Vitals:   11/04/19 0833  BP: 134/90  Pulse: 88  Temp: 98.1 F (36.7 C)  TempSrc: Oral  SpO2: 100%  Weight: 134 lb 1.6 oz (60.8 kg)  Height: 5\' 2"  (1.575 m)    General: Well-appearing female in no acute distress  Derm: There is no new mercury hyperpigmentation around both corners of the mouth as well as bilateral cheeks.  No raised lesions or discharge noted.        Assessment & Plan:   See Encounters Tab for problem based charting.  Patient discussed with Dr. 

## 2019-11-04 NOTE — Patient Instructions (Signed)
[name],   I am not sure what is causing the hyperpigmentation around your mouth.  I will place a referral to dermatology for further evaluation.  They should be contacting you next week for an appointment.  I recommend you continue using cetaphil soap twice a day.  Please avoid toothpaste with fluorine in it to avoid more irritation around the mouth.  You can also start using vitamin C serum (do not need a prescription for this) and sunscreen.  Call us if you have any questions or concerns.  -Dr. Evelene Croon

## 2019-11-05 NOTE — Progress Notes (Signed)
Internal Medicine Clinic Attending  Case discussed with Dr. Santos-Sanchez at the time of the visit.  We reviewed the resident's history and exam and pertinent patient test results.  I agree with the assessment, diagnosis, and plan of care documented in the resident's note.    

## 2019-11-26 ENCOUNTER — Other Ambulatory Visit: Payer: Self-pay

## 2019-11-26 DIAGNOSIS — Z1231 Encounter for screening mammogram for malignant neoplasm of breast: Secondary | ICD-10-CM

## 2019-12-23 ENCOUNTER — Other Ambulatory Visit: Payer: Self-pay | Admitting: *Deleted

## 2019-12-23 DIAGNOSIS — M5442 Lumbago with sciatica, left side: Secondary | ICD-10-CM

## 2019-12-23 DIAGNOSIS — K219 Gastro-esophageal reflux disease without esophagitis: Secondary | ICD-10-CM

## 2019-12-23 DIAGNOSIS — M62838 Other muscle spasm: Secondary | ICD-10-CM

## 2019-12-23 DIAGNOSIS — G8929 Other chronic pain: Secondary | ICD-10-CM

## 2019-12-23 DIAGNOSIS — J45990 Exercise induced bronchospasm: Secondary | ICD-10-CM

## 2019-12-23 NOTE — Telephone Encounter (Signed)
Pt stated she wants her meds refilled at [facility] which will be free except for Flexeril and cholesterol med (at [facility]).

## 2019-12-24 MED ORDER — CALCIUM CARBONATE-VITAMIN D 500-200 MG-UNIT PO TABS: 1.0000 | ORAL_TABLET | Freq: Every day | ORAL | 3 refills | Status: DC

## 2019-12-24 MED ORDER — OMEPRAZOLE 20 MG PO CPDR: 20.0000 mg | DELAYED_RELEASE_CAPSULE | Freq: Every day | ORAL | 3 refills | Status: DC

## 2019-12-24 MED ORDER — DULOXETINE HCL 30 MG PO CPEP: 30.0000 mg | ORAL_CAPSULE | Freq: Every day | ORAL | 3 refills | Status: DC

## 2019-12-24 MED ORDER — ALBUTEROL SULFATE (2.5 MG/3ML) 0.083% IN NEBU: 2.5000 mg | INHALATION_SOLUTION | RESPIRATORY_TRACT | 6 refills | Status: DC

## 2019-12-24 MED ORDER — MOMETASONE FUROATE 50 MCG/ACT NA SUSP: 2.0000 | Freq: Every day | NASAL | 6 refills | Status: DC

## 2019-12-24 MED ORDER — MOMETASONE FURO-FORMOTEROL FUM 100-5 MCG/ACT IN AERO: 1.0000 | INHALATION_SPRAY | Freq: Two times a day (BID) | RESPIRATORY_TRACT | 3 refills | Status: DC

## 2019-12-24 MED ORDER — ALBUTEROL SULFATE HFA 108 (90 BASE) MCG/ACT IN AERS: 2.0000 | INHALATION_SPRAY | Freq: Four times a day (QID) | RESPIRATORY_TRACT | 6 refills | Status: DC | PRN

## 2020-01-06 ENCOUNTER — Ambulatory Visit: Payer: Self-pay

## 2020-02-18 ENCOUNTER — Other Ambulatory Visit: Payer: Self-pay | Admitting: Internal Medicine

## 2020-02-18 DIAGNOSIS — N951 Menopausal and female climacteric states: Secondary | ICD-10-CM

## 2020-06-15 ENCOUNTER — Ambulatory Visit: Payer: Self-pay

## 2020-07-26 ENCOUNTER — Other Ambulatory Visit: Payer: Self-pay | Admitting: Internal Medicine

## 2020-07-26 DIAGNOSIS — E785 Hyperlipidemia, unspecified: Secondary | ICD-10-CM

## 2020-07-26 IMAGING — DX RIGHT SHOULDER - 2+ VIEW
3 series · 3 of 3 positions shown · non-contrast
Comparison: None.

CLINICAL DATA: Chronic progressive right shoulder pain.

EXAM:
RIGHT SHOULDER - 2+ VIEW

[w shoulder external right]
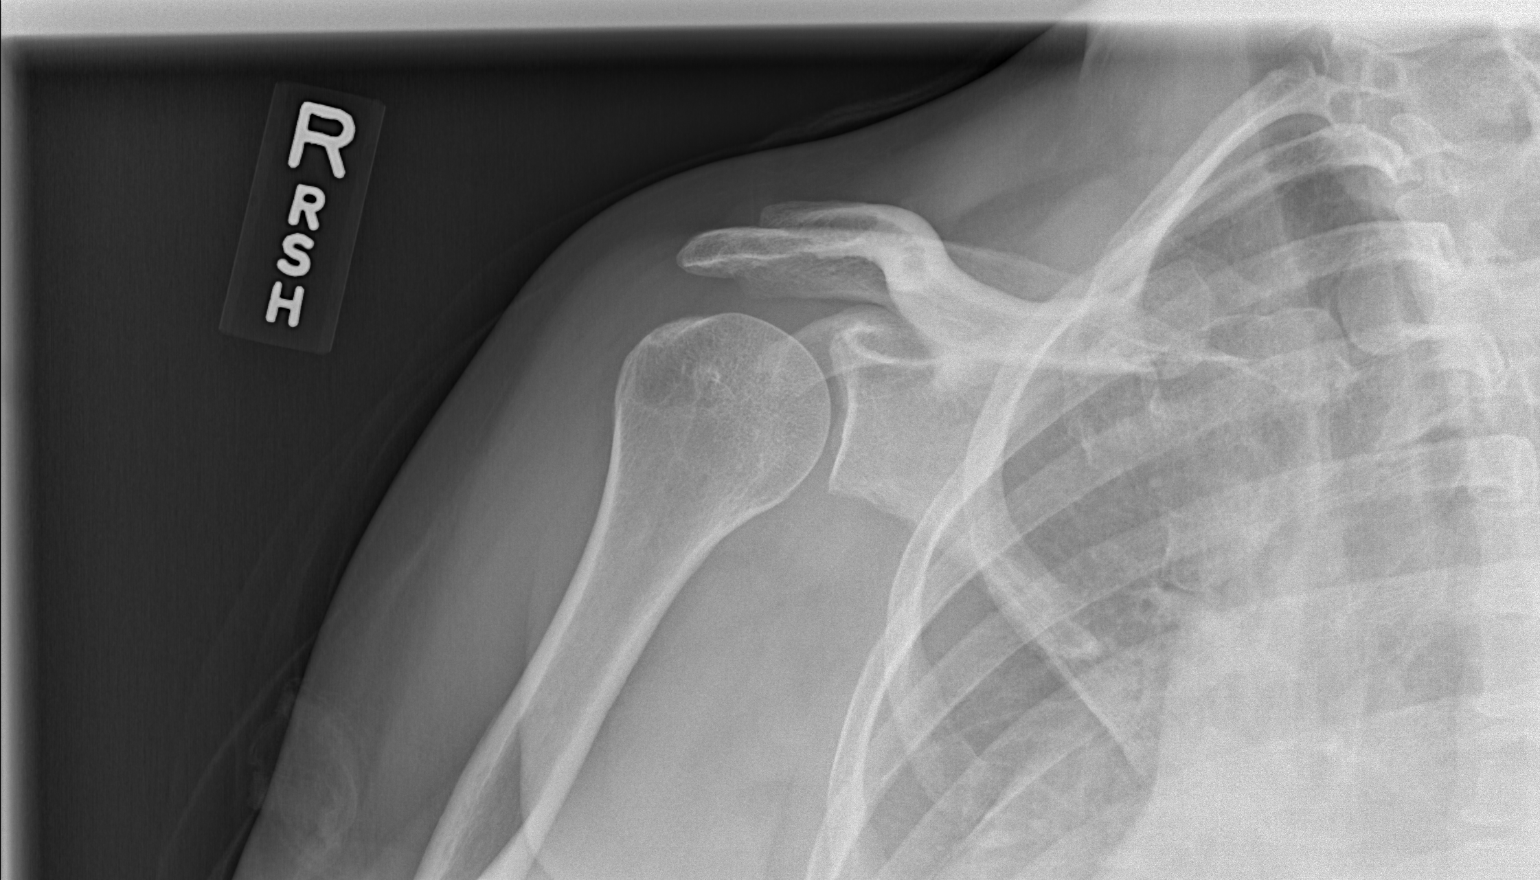

[w shoulder y-view right]
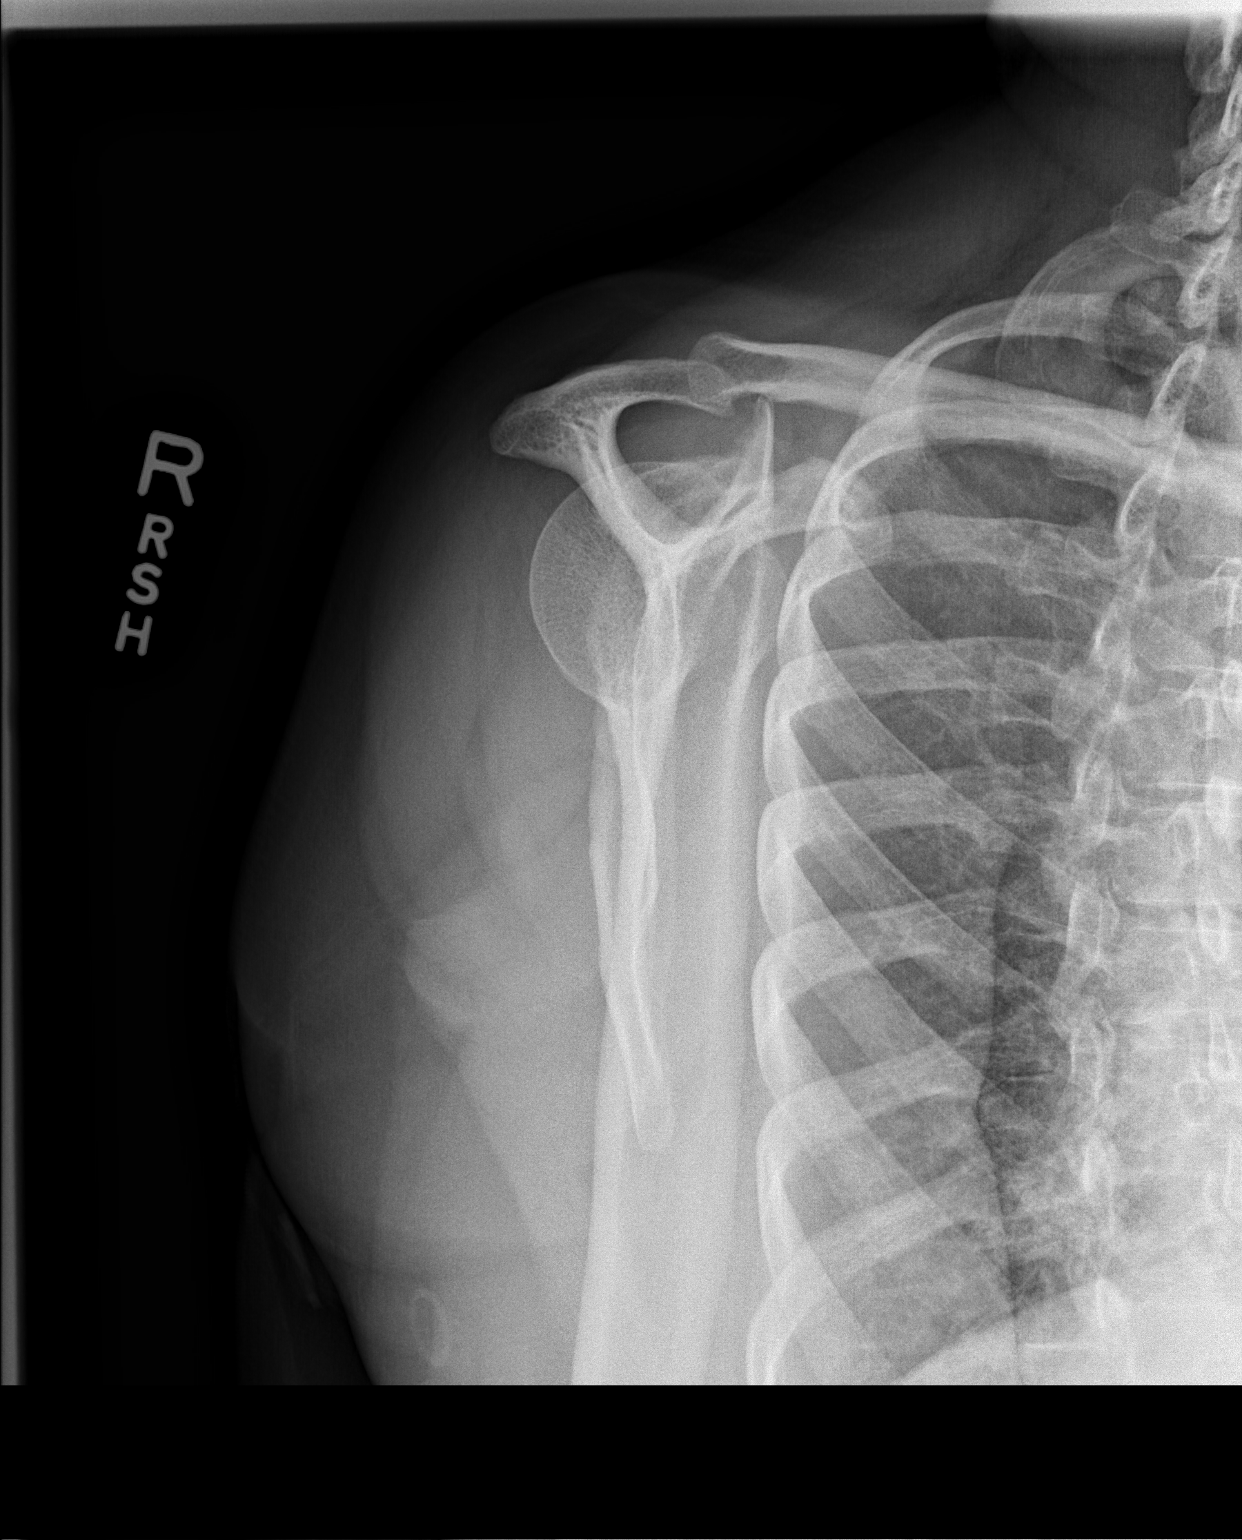

[x shoulder axillary right]
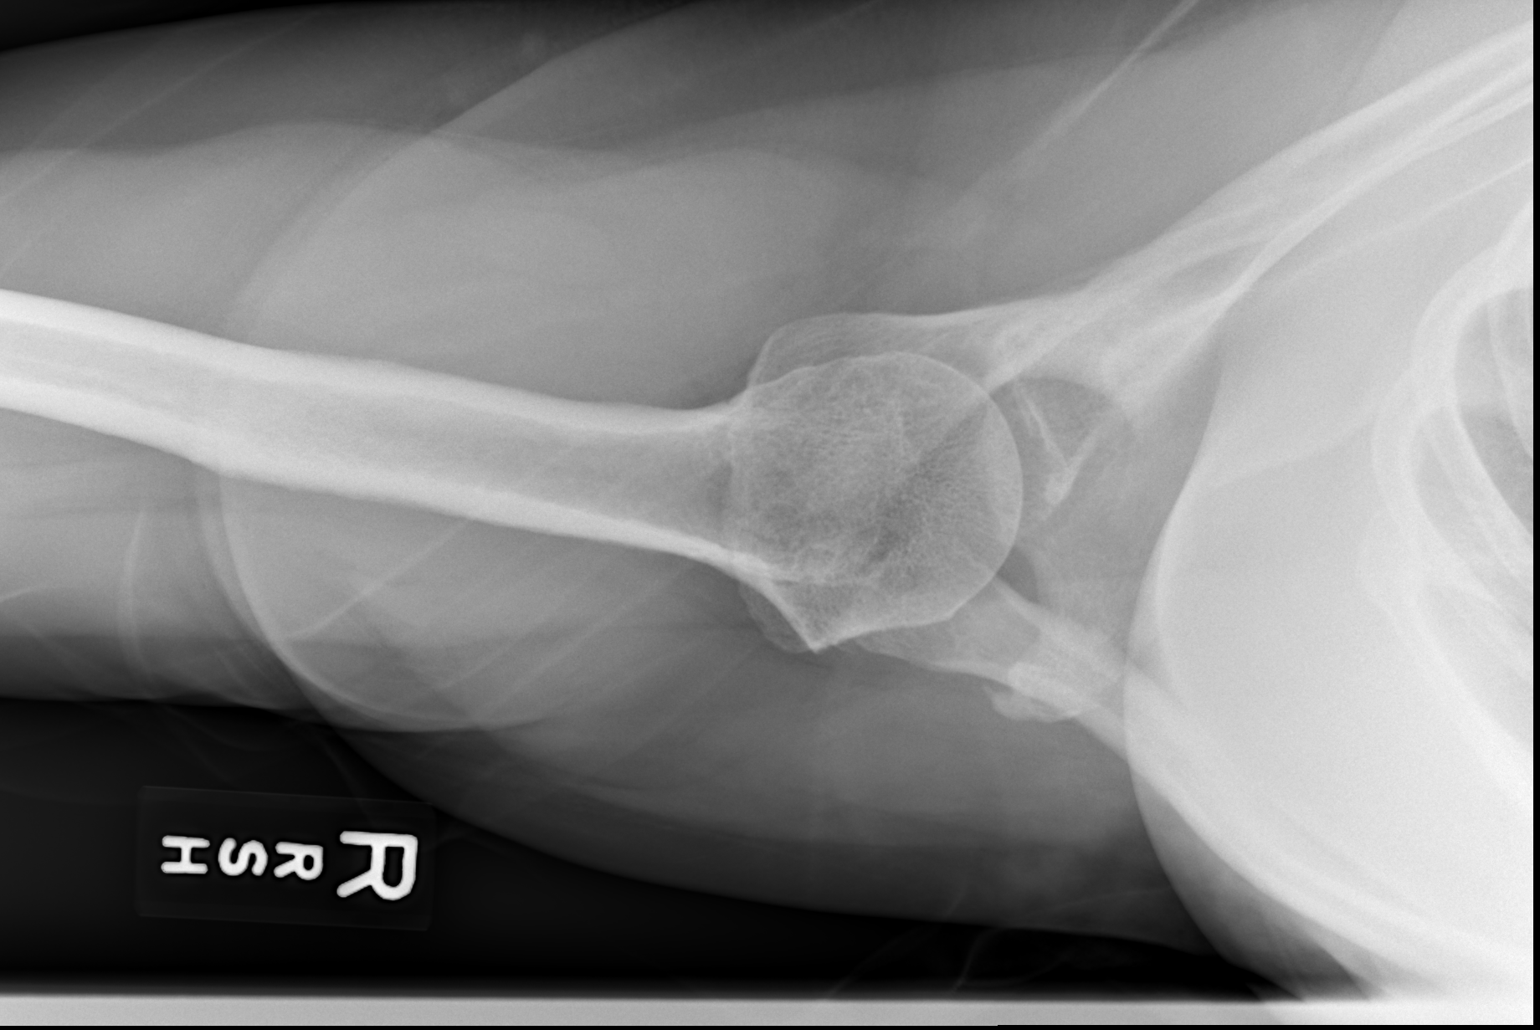

[3 of 3 positions shown; findings below may reference images not displayed]

FINDINGS: There is no evidence of fracture or dislocation. There is no
evidence of arthropathy or other focal bone abnormality. Soft
tissues are unremarkable.
IMPRESSION: Negative.

## 2020-08-25 ENCOUNTER — Telehealth: Payer: Self-pay | Admitting: *Deleted

## 2020-08-25 NOTE — Telephone Encounter (Signed)
Pt needs to see Briana Parker for orange card, need to move away from using IM PROGRAM every monthl

## 2020-08-26 ENCOUNTER — Other Ambulatory Visit: Payer: Self-pay | Admitting: Internal Medicine

## 2020-08-26 DIAGNOSIS — M62838 Other muscle spasm: Secondary | ICD-10-CM

## 2020-09-15 ENCOUNTER — Other Ambulatory Visit: Payer: Self-pay | Admitting: Internal Medicine

## 2020-09-15 DIAGNOSIS — N951 Menopausal and female climacteric states: Secondary | ICD-10-CM

## 2020-09-20 ENCOUNTER — Telehealth: Payer: Self-pay

## 2020-09-20 NOTE — Telephone Encounter (Signed)
Received TC from Diane at [facility] regarding Premarin Vaginal Cream RX.  She states it only comes in a 30gm box, 42.5gm box ordered.  Wants to know if it's ok to change RX to a 30gm box.  VO given for 30gm box/Dr. Speakman/SChaplin Thanks, SChaplin, RN,BSN

## 2021-01-29 ENCOUNTER — Encounter: Payer: Self-pay | Admitting: *Deleted

## 2021-04-05 ENCOUNTER — Encounter: Payer: Self-pay | Admitting: *Deleted

## 2021-04-07 ENCOUNTER — Other Ambulatory Visit (HOSPITAL_COMMUNITY): Payer: Self-pay

## 2021-04-07 MED FILL — Cyclobenzaprine HCl Tab 10 MG: ORAL | 30 days supply | Qty: 90 | Fill #0 | Status: AC

## 2021-04-07 MED FILL — Rosuvastatin Calcium Tab 10 MG: ORAL | 30 days supply | Qty: 30 | Fill #0 | Status: AC

## 2021-04-12 NOTE — Congregational Nurse Program (Signed)
  Dept: [phone]   Congregational Nurse Program Note  Date of Encounter: 04/05/2021  Past Medical History: Past Medical History:  Diagnosis Date   Allergic rhinitis    Allergy    seasonal   Anxiety    Arthritis    back   Chronic bronchitis (HCC)    COPD (chronic obstructive pulmonary disease) (HCC)    Depression    Fibromyalgia    GERD (gastroesophageal reflux disease)    Hyperlipidemia    Low back pain     Encounter Details:  CNP Questionnaire - 04/12/21 1153       Questionnaire   Do you give verbal consent to treat you today? Yes    Visit Setting Church or Organization    Location Patient Served At [facility]    Patient Status Not Applicable    Medical Provider Yes    Insurance Uninsured (Includes [facility] Card/Care [place])    Intervention Assess (including screenings);Educate    Medication Have medication insecurities    ED Visit Averted Yes            Client came into [facility] nurse clinic and asked for BP and CBG check.  BP 134/89 HR 93 and CBG was 127.  Discussed elevated BP with client and encouraged client to eat healthy and exercise daily for good health.  Encouraged client to recheck BP in 2-4 weeks.  Client has no insurance at this time.  Will follow up per client request.  Roderic Palau, RN, MSN, CNP [phone] Office [phone] Cell

## 2021-04-19 ENCOUNTER — Encounter: Payer: Self-pay | Admitting: Internal Medicine

## 2021-04-19 ENCOUNTER — Other Ambulatory Visit: Payer: Self-pay

## 2021-04-19 ENCOUNTER — Other Ambulatory Visit (HOSPITAL_COMMUNITY): Payer: Self-pay

## 2021-04-19 ENCOUNTER — Ambulatory Visit (INDEPENDENT_AMBULATORY_CARE_PROVIDER_SITE_OTHER): Payer: Self-pay | Admitting: Internal Medicine

## 2021-04-19 VITALS — BP 131/97 | HR 87 | Temp 99.3°F | Ht 62.0 in | Wt 135.3 lb

## 2021-04-19 DIAGNOSIS — K219 Gastro-esophageal reflux disease without esophagitis: Secondary | ICD-10-CM

## 2021-04-19 DIAGNOSIS — N951 Menopausal and female climacteric states: Secondary | ICD-10-CM

## 2021-04-19 DIAGNOSIS — Z131 Encounter for screening for diabetes mellitus: Secondary | ICD-10-CM

## 2021-04-19 DIAGNOSIS — M62838 Other muscle spasm: Secondary | ICD-10-CM

## 2021-04-19 DIAGNOSIS — J45990 Exercise induced bronchospasm: Secondary | ICD-10-CM

## 2021-04-19 DIAGNOSIS — M79671 Pain in right foot: Secondary | ICD-10-CM

## 2021-04-19 DIAGNOSIS — J329 Chronic sinusitis, unspecified: Secondary | ICD-10-CM

## 2021-04-19 DIAGNOSIS — G6289 Other specified polyneuropathies: Secondary | ICD-10-CM

## 2021-04-19 DIAGNOSIS — E785 Hyperlipidemia, unspecified: Secondary | ICD-10-CM

## 2021-04-19 DIAGNOSIS — R55 Syncope and collapse: Secondary | ICD-10-CM

## 2021-04-19 LAB — POCT GLYCOSYLATED HEMOGLOBIN (HGB A1C): Hemoglobin A1C: 5.8 % — AB (ref 4.0–5.6)

## 2021-04-19 LAB — GLUCOSE, CAPILLARY: Glucose-Capillary: 98 mg/dL (ref 70–99)

## 2021-04-19 MED ORDER — CYCLOBENZAPRINE HCL 10 MG PO TABS
10.0000 mg | ORAL_TABLET | Freq: Three times a day (TID) | ORAL | 6 refills | Status: DC | PRN
  Filled 2021-04-19 – 2021-05-01 (×2): qty 90, 30d supply, fill #0

## 2021-04-19 MED ORDER — ALBUTEROL SULFATE HFA 108 (90 BASE) MCG/ACT IN AERS
2.0000 | INHALATION_SPRAY | Freq: Four times a day (QID) | RESPIRATORY_TRACT | 6 refills | Status: DC | PRN
  Filled 2021-04-19: qty 8.5, 25d supply, fill #0
  Filled 2021-05-03 – 2021-05-30 (×2): qty 8.5, 25d supply, fill #1
  Filled 2021-09-15: qty 8.5, 25d supply, fill #2

## 2021-04-19 MED ORDER — ALBUTEROL SULFATE (2.5 MG/3ML) 0.083% IN NEBU
2.5000 mg | INHALATION_SOLUTION | RESPIRATORY_TRACT | 6 refills | Status: DC
  Filled 2021-04-19: qty 75, 6d supply, fill #0
  Filled 2021-05-03 – 2021-05-04 (×2): qty 75, 6d supply, fill #1
  Filled 2021-05-30: qty 75, 6d supply, fill #2
  Filled 2021-09-15: qty 75, 6d supply, fill #3
  Filled 2021-10-19: qty 75, 6d supply, fill #4
  Filled 2021-11-29: qty 75, 6d supply, fill #5

## 2021-04-19 MED ORDER — MOMETASONE FURO-FORMOTEROL FUM 100-5 MCG/ACT IN AERO
1.0000 | INHALATION_SPRAY | Freq: Two times a day (BID) | RESPIRATORY_TRACT | 3 refills | Status: DC
  Filled 2021-04-19: qty 13, 30d supply, fill #0
  Filled 2021-05-30: qty 13, 30d supply, fill #1
  Filled 2021-09-15: qty 13, 30d supply, fill #2
  Filled 2021-11-29: qty 13, 30d supply, fill #3

## 2021-04-19 MED ORDER — DULOXETINE HCL 30 MG PO CPEP
30.0000 mg | ORAL_CAPSULE | Freq: Every day | ORAL | 3 refills | Status: DC
  Filled 2021-04-19: qty 30, 30d supply, fill #0
  Filled 2021-05-30: qty 30, 30d supply, fill #1
  Filled 2021-07-13: qty 30, 30d supply, fill #2
  Filled 2021-09-15: qty 30, 30d supply, fill #3
  Filled 2021-10-19: qty 30, 30d supply, fill #4

## 2021-04-19 MED ORDER — ROSUVASTATIN CALCIUM 10 MG PO TABS
10.0000 mg | ORAL_TABLET | Freq: Every day | ORAL | 11 refills | Status: DC
  Filled 2021-04-19 – 2021-05-01 (×2): qty 30, 30d supply, fill #0

## 2021-04-19 MED ORDER — OMEPRAZOLE 20 MG PO CPDR
20.0000 mg | DELAYED_RELEASE_CAPSULE | Freq: Every day | ORAL | 3 refills | Status: DC
  Filled 2021-04-19: qty 30, 30d supply, fill #0
  Filled 2021-05-30: qty 30, 30d supply, fill #1
  Filled 2021-07-13: qty 30, 30d supply, fill #2
  Filled 2021-09-15: qty 30, 30d supply, fill #3

## 2021-04-19 NOTE — Progress Notes (Addendum)
   CC: foot pain  HPI:[name] is a 55 y.o. female who presents for evaluation of foot pain. Please see individual problem based A/P for details.  Please see encounters tab for problem based charting  Depression, PHQ-9: Based on the patients  Flowsheet Row Office Visit from 11/04/2019 in [facility]  PHQ-9 Total Score 0      score we have 0.  Past Medical History:  Diagnosis Date   Allergic rhinitis    Allergy    seasonal   Anxiety    Arthritis    back   Chronic bronchitis (HCC)    COPD (chronic obstructive pulmonary disease) (HCC)    Depression    Fibromyalgia    GERD (gastroesophageal reflux disease)    Hyperlipidemia    Low back pain    Review of Systems:   Review of Systems  Constitutional: Negative.  Negative for fever.  HENT: Negative.    Eyes: Negative.   Respiratory:  Positive for cough and sputum production. Negative for shortness of breath.   Cardiovascular: Negative.   Gastrointestinal:  Positive for constipation and heartburn.  Genitourinary: Negative.   Skin: Negative.   Neurological:  Positive for loss of consciousness.  Endo/Heme/Allergies: Negative.   Psychiatric/Behavioral: Negative.      Physical Exam: Vitals:   04/19/21 1000 04/19/21 1006  BP: (!) 142/87 (!) 131/97  Pulse: 90 87  Temp: 99.3 F (37.4 C)   TempSrc: Oral   SpO2: 99%   Weight: 135 lb 4.8 oz (61.4 kg)   Height: 5\' 2"  (1.575 m)      General: alert and oriented. No acute distress HEENT: Conjunctiva nl , antiicteric sclerae, moist mucous membranes, no exudate or erythema Cardiovascular: Normal rate, regular rhythm.  No murmurs, rubs, or gallops Pulmonary : Equal breath sounds, No wheezes, rales, or rhonchi Abdominal: soft, nontender,  bowel sounds present Ext: slight swelling dorsolateral surface of right ankle, no discoloration, no tenderness to palpation. No edema in lower extremities, no tenderness to palpation of lower extremities.    Assessment & Plan:   See Encounters Tab for problem based charting.  Patient seen with Dr. 

## 2021-04-19 NOTE — Patient Instructions (Addendum)
Dear Mrs. Exum,  Thank you for trusting me with your care today.   Today we addressed the following concerns:  Foot pain/tingling - this is possibly related to damage to the nerve. Please be aware of activities that aggravate the pain. - Continue icy-hot and avoid aggravating activities. - we checked your A1c to rule out diabetes as a cause  - Sinus congestion - likely related to sinus drainage. - we will prescribe you flonase  Passing out on the toilet - likely a vasovagal response to straining - we will prescribe you miralax. Please schedule when you take this medication to ensure regular bowel movements - ensure that you stay well hydrated throughout the day.   Persistent hot flashes and night sweats - likely still symptoms of menopause - we can prescribe you medication in the future if you want to control your symptoms  Please follow up in 1 year if symptoms persist or worsen.

## 2021-04-20 ENCOUNTER — Encounter: Payer: Self-pay | Admitting: Internal Medicine

## 2021-04-20 DIAGNOSIS — M79671 Pain in right foot: Secondary | ICD-10-CM | POA: Insufficient documentation

## 2021-04-20 DIAGNOSIS — R55 Syncope and collapse: Secondary | ICD-10-CM | POA: Insufficient documentation

## 2021-04-20 LAB — CMP14 + ANION GAP
ALT: 15 IU/L (ref 0–32)
AST: 22 IU/L (ref 0–40)
Albumin/Globulin Ratio: 2 (ref 1.2–2.2)
Albumin: 4.6 g/dL (ref 3.8–4.9)
Alkaline Phosphatase: 77 IU/L (ref 44–121)
Anion Gap: 14 mmol/L (ref 10.0–18.0)
BUN/Creatinine Ratio: 14 (ref 9–23)
BUN: 11 mg/dL (ref 6–24)
Bilirubin Total: 0.3 mg/dL (ref 0.0–1.2)
CO2: 24 mmol/L (ref 20–29)
Calcium: 9.5 mg/dL (ref 8.7–10.2)
Chloride: 103 mmol/L (ref 96–106)
Creatinine, Ser: 0.78 mg/dL (ref 0.57–1.00)
Globulin, Total: 2.3 g/dL (ref 1.5–4.5)
Glucose: 90 mg/dL (ref 65–99)
Potassium: 4 mmol/L (ref 3.5–5.2)
Sodium: 141 mmol/L (ref 134–144)
Total Protein: 6.9 g/dL (ref 6.0–8.5)
eGFR: 89 mL/min/{1.73_m2} (ref >59)

## 2021-04-20 LAB — LIPID PANEL
Chol/HDL Ratio: 3.6 ratio (ref 0.0–4.4)
Cholesterol, Total: 175 mg/dL (ref 100–199)
HDL: 49 mg/dL (ref >39)
LDL Chol Calc (NIH): 102 mg/dL — ABNORMAL HIGH (ref 0–99)
Triglycerides: 135 mg/dL (ref 0–149)
VLDL Cholesterol Cal: 24 mg/dL (ref 5–40)

## 2021-04-20 NOTE — Assessment & Plan Note (Addendum)
-   likely vasovagal event related to straining during BM. Will start patient on miralax daily to regulate bowel function.  - encourage hydration - can work up further if symptoms persist despite bowel regimen

## 2021-04-20 NOTE — Assessment & Plan Note (Signed)
-   currently on rosuvastatin 10mg  - lipid panel ordered, will f/u

## 2021-04-20 NOTE — Assessment & Plan Note (Signed)
Patient reports roughly 1 week ago she developed pressure in her sinuses with itchy eyes. These symptoms have since resolved. Now complaining of cough with production of green sputum likely sinus drainage. - will start on flonase 2 sprays each nostril once per day

## 2021-04-20 NOTE — Assessment & Plan Note (Signed)
Pain in lateral aspect of right foot with pins/needles sensation. No loss of sensation. ROM normal. No obvious deformities.  - patient concerned for diabetic neuropathy though this is unlikely given unilateral findings. A1c ordered to r/o and returned 5.8. - - complaints likely result of damage to peroneal nerve. - advised rest, ice for symptoms and avoiding aggravating activities

## 2021-04-20 NOTE — Assessment & Plan Note (Signed)
Menopause onset 2014. Patient reporting continued night sweats and hot flashes predominantly at night. - Offered options of estrogen or gabapentin, however patient deferred medical management for symptoms at this time.

## 2021-04-25 NOTE — Progress Notes (Signed)
Internal Medicine Clinic Attending  I saw and evaluated the patient.  I personally confirmed the key portions of the history and exam documented by Dr. Burnice Logan and I reviewed pertinent patient test results.  The assessment, diagnosis, and plan were formulated together and I agree with the documentation in the resident's note.   Patient with focal neuropathic pain isolated to the lateral aspect of her foot. 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 I suspect this is fibular nerve compression leading to peroneal nerve dysfunction. Discussed modifying her sitting position, avoid crossing her knees.

## 2021-05-02 ENCOUNTER — Other Ambulatory Visit (HOSPITAL_COMMUNITY): Payer: Self-pay

## 2021-05-03 ENCOUNTER — Other Ambulatory Visit (HOSPITAL_COMMUNITY): Payer: Self-pay

## 2021-05-04 ENCOUNTER — Other Ambulatory Visit (HOSPITAL_COMMUNITY): Payer: Self-pay

## 2021-05-04 ENCOUNTER — Other Ambulatory Visit: Payer: Self-pay | Admitting: Internal Medicine

## 2021-05-04 ENCOUNTER — Telehealth: Payer: Self-pay

## 2021-05-04 DIAGNOSIS — E785 Hyperlipidemia, unspecified: Secondary | ICD-10-CM

## 2021-05-04 MED ORDER — ROSUVASTATIN CALCIUM 10 MG PO TABS
10.0000 mg | ORAL_TABLET | Freq: Every day | ORAL | 3 refills | Status: DC
  Filled 2021-05-04: qty 90, 90d supply, fill #0
  Filled 2021-05-30: qty 30, 30d supply, fill #0
  Filled 2021-07-13: qty 30, 30d supply, fill #1
  Filled 2021-09-15: qty 30, 30d supply, fill #2
  Filled 2021-10-19: qty 30, 30d supply, fill #3
  Filled 2021-11-29: qty 30, 30d supply, fill #4

## 2021-05-04 NOTE — Progress Notes (Signed)
Refill sent for rosuvastatin . Denied cyclobenzaprine, no indication for long term muscle relaxer.

## 2021-05-04 NOTE — Telephone Encounter (Signed)
cyclobenzaprine (FLEXERIL) 10 MG tablet  rosuvastatin (CRESTOR) 10 MG tablet, refill request @ [facility]. Per pt needs to be for $4.00

## 2021-05-05 ENCOUNTER — Other Ambulatory Visit (HOSPITAL_COMMUNITY): Payer: Self-pay

## 2021-05-18 ENCOUNTER — Ambulatory Visit: Payer: Self-pay

## 2021-05-30 ENCOUNTER — Other Ambulatory Visit (HOSPITAL_COMMUNITY): Payer: Self-pay

## 2021-05-31 ENCOUNTER — Ambulatory Visit (INDEPENDENT_AMBULATORY_CARE_PROVIDER_SITE_OTHER): Payer: Self-pay | Admitting: *Deleted

## 2021-05-31 DIAGNOSIS — Z23 Encounter for immunization: Secondary | ICD-10-CM

## 2021-07-13 ENCOUNTER — Other Ambulatory Visit (HOSPITAL_COMMUNITY): Payer: Self-pay

## 2021-07-15 ENCOUNTER — Other Ambulatory Visit: Payer: Self-pay | Admitting: Internal Medicine

## 2021-07-15 ENCOUNTER — Other Ambulatory Visit (HOSPITAL_COMMUNITY): Payer: Self-pay

## 2021-07-15 DIAGNOSIS — M62838 Other muscle spasm: Secondary | ICD-10-CM

## 2021-07-15 NOTE — Telephone Encounter (Signed)
Last ov 04/19/21 No future visits scheduled  Cyclobenzaprine no longer on medication list Please advise  Kingsley Spittle Cassady11/4/202211:16 AM

## 2021-09-15 ENCOUNTER — Other Ambulatory Visit (HOSPITAL_COMMUNITY): Payer: Self-pay

## 2021-09-15 MED ORDER — ALBUTEROL SULFATE HFA 108 (90 BASE) MCG/ACT IN AERS
2.0000 | INHALATION_SPRAY | RESPIRATORY_TRACT | 3 refills | Status: DC
  Filled 2021-09-15: qty 18, 25d supply, fill #0
  Filled 2021-11-29: qty 18, 25d supply, fill #1
  Filled 2021-12-07: qty 6.7, 10d supply, fill #1
  Filled 2021-12-07: qty 18, 25d supply, fill #1

## 2021-10-15 ENCOUNTER — Other Ambulatory Visit: Payer: Self-pay

## 2021-10-15 ENCOUNTER — Ambulatory Visit (HOSPITAL_COMMUNITY)
Admission: EM | Admit: 2021-10-15 | Discharge: 2021-10-15 | Disposition: A | Discharge status: Home / Self Care | Payer: Managed Care, Other (non HMO) | Attending: Emergency Medicine | Admitting: Emergency Medicine

## 2021-10-15 ENCOUNTER — Encounter (HOSPITAL_COMMUNITY): Payer: Self-pay | Admitting: Emergency Medicine

## 2021-10-15 DIAGNOSIS — R102 Pelvic and perineal pain: Secondary | ICD-10-CM | POA: Diagnosis present

## 2021-10-15 DIAGNOSIS — J029 Acute pharyngitis, unspecified: Secondary | ICD-10-CM | POA: Insufficient documentation

## 2021-10-15 DIAGNOSIS — R300 Dysuria: Secondary | ICD-10-CM | POA: Diagnosis present

## 2021-10-15 DIAGNOSIS — M940 Chondrocostal junction syndrome [Tietze]: Secondary | ICD-10-CM

## 2021-10-15 LAB — POCT URINALYSIS DIPSTICK, ED / UC
Bilirubin Urine: NEGATIVE
Glucose, UA: NEGATIVE mg/dL
Hgb urine dipstick: NEGATIVE
Nitrite: NEGATIVE
Protein, ur: NEGATIVE mg/dL
Specific Gravity, Urine: 1.02 (ref 1.005–1.030)
Urobilinogen, UA: 0.2 mg/dL (ref 0.0–1.0)
pH: 6.5 (ref 5.0–8.0)

## 2021-10-15 LAB — POCT RAPID STREP A, ED / UC: Streptococcus, Group A Screen (Direct): NEGATIVE

## 2021-10-15 MED ORDER — LIDOCAINE VISCOUS HCL 2 % MT SOLN: 15.0000 mL | OROMUCOSAL | 0 refills | Status: DC | PRN

## 2021-10-15 MED ORDER — PHENAZOPYRIDINE HCL 200 MG PO TABS: 200.0000 mg | ORAL_TABLET | Freq: Three times a day (TID) | ORAL | 0 refills | Status: DC

## 2021-10-15 NOTE — ED Provider Notes (Signed)
[facility]    CSN: [id number] Arrival date & time: 10/15/21  1109      History   Chief Complaint Chief Complaint  Patient presents with   Back Pain    HPI Jatyra Yohman is a 56 y.o. female.   Patient presents with difficulty starting stream, dysuria, bilateral flank pain and lower abdominal pressure for 4 days after beginning use of probiotic tea 7 days ago .  Has not attempted treatment of symptoms.  Denies hematuria, vaginal discharge, itching, irritation, odor, fever, chills.   Patient endorses sore throat and chills for 3 days.  Painful to swallow but tolerating food and liquids.  No known sick contacts.  Denies fever, body aches, nasal congestion, rhinorrhea, cough, shortness of breath, wheezing, headaches, abdominal pain, nausea, vomiting, diarrhea.  Has not attempted treatment of symptoms.,   Patient presents with left-sided chest throbbing for 2 weeks.  Pain initially started in the bilateral shoulders with associated spasming and has now moved to the chest.  Symptoms improved with massage.  Pain does not radiate and has not worsened by movement.  Denies cardiac history or familial history.  Has not attempted treatment for denies precipitating event, pushing pulling or lifting, prior trauma or injury.  Denies breast tenderness, chest pain, shortness of breath.     Past Medical History:  Diagnosis Date   Allergic rhinitis    Allergy    seasonal   Anxiety    Arthritis    back   Chronic bronchitis (HCC)    COPD (chronic obstructive pulmonary disease) (HCC)    Depression    Fibromyalgia    GERD (gastroesophageal reflux disease)    Hyperlipidemia    Low back pain     Patient Active Problem List   Diagnosis Date Noted   Vasovagal syncopes 04/20/2021   Foot pain, right 04/20/2021   Pruritus 11/04/2019   Facial rash 11/04/2019   Achilles tendinitis of both lower extremities 10/10/2018   Trigger finger, left middle finger 07/22/2018   Dry eye  04/15/2018   Shoulder pain (Bilateral) 10/10/2017   Gastroesophageal reflux disease without esophagitis 10/10/2017   Asthma, exercise induced 04/13/2017   Chronic low back pain 10/06/2015   Carpal tunnel syndrome, bilateral 02/12/2015   Menopausal syndrome (hot flashes) with mood lability, depression, sleep disturbance, and vaginal dryness  04/15/2014   Screening for diabetes mellitus 11/10/2013   Sinusitis, chronic 05/29/2013   HEMORRHOIDS 05/08/2007   Hyperlipidemia 07/13/2006    Past Surgical History:  Procedure Laterality Date   ABDOMINAL HYSTERECTOMY     CARPAL TUNNEL RELEASE Right 05/05/2015   Procedure: RIGHT CARPAL TUNNEL RELEASE;  Surgeon: Leandrew Koyanagi, MD;  Location: [facility];  Service: Orthopedics;  Laterality: Right;   TRIGGER FINGER RELEASE Left 05/05/2015   Procedure: LEFT TRIGGER THUMB RELEASE;  Surgeon: Leandrew Koyanagi, MD;  Location: [facility];  Service: Orthopedics;  Laterality: Left;   WISDOM TOOTH EXTRACTION      OB History   No obstetric history on file.      Home Medications    Prior to Admission medications   Medication Sig Start Date End Date Taking? Authorizing Provider  albuterol (PROVENTIL) (2.5 MG/3ML) 0.083% nebulizer solution Take 3 mLs (2.5 mg total) by nebulization every 6 hours as needed for wheezing or shortness of breath. 04/19/21   Delene Ruffini, MD  albuterol (VENTOLIN HFA) 108 (90 Base) MCG/ACT inhaler Inhale 2 puffs into the lungs 4 (four) times daily as needed for wheezing  or shortness of breath. 04/19/21 04/19/22  Delene Ruffini, MD  albuterol (VENTOLIN HFA) 108 (90 Base) MCG/ACT inhaler Inhale 2 puffs into the lungs 4 times daily as needed for wheezing or shortness of breath 04/19/21   Delene Ruffini, MD  calcium-vitamin D (OSCAL WITH D) 500-200 MG-UNIT tablet Take 1 tablet by mouth daily with breakfast. 12/24/19   Mosetta Anis, MD  DULoxetine (CYMBALTA) 30 MG capsule Take 1 capsule (30 mg total) by mouth  daily. 04/19/21 04/19/22  Delene Ruffini, MD  mometasone (NASONEX) 50 MCG/ACT nasal spray Place 2 sprays into the nose daily. 12/24/19 12/20/20  Mosetta Anis, MD  mometasone-formoterol (DULERA) 100-5 MCG/ACT AERO Inhale 1 puff into the lungs 2 (two) times daily. 04/19/21   Delene Ruffini, MD  omeprazole (PRILOSEC) 20 MG capsule Take 1 capsule (20 mg total) by mouth daily. 04/19/21 04/19/22  Delene Ruffini, MD  PREMARIN vaginal cream PLACE 0.5 GM OF CREAM VAGINALLY 2 TIMES A WEEK 09/15/20   Jeralyn Bennett, MD  rosuvastatin (CRESTOR) 10 MG tablet Take 1 tablet (10 mg total) by mouth daily. 05/04/21   Madalyn Rob, MD    Family History Family History  Problem Relation Age of Onset   Diabetes Mother        died recently in Feb 10, 2010 due to 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  She was on dialysis and died with cardiac arrest as per pt.   Heart disease Father    Hypertension Father    Coronary artery disease Other        Strong family history in first degree relatives   Colon cancer Neg Hx     Social History Social History   Tobacco Use   Smoking status: Never   Smokeless tobacco: Never  Substance Use Topics   Alcohol use: No    Alcohol/week: 0.0 standard drinks   Drug use: No     Allergies   Augmentin [amoxicillin-pot clavulanate], Codeine, Hydrocodone, Nortriptyline, Pravastatin sodium, and Simvastatin   Review of Systems Review of Systems  Constitutional:  Positive for appetite change and chills. Negative for activity change, diaphoresis, fatigue, fever and unexpected weight change.  HENT:  Positive for sore throat. Negative for congestion, dental problem, drooling, ear discharge, ear pain, facial swelling, hearing loss, mouth sores, nosebleeds, postnasal drip, rhinorrhea, sinus pressure, sinus pain, sneezing, tinnitus, trouble swallowing and voice change.   Eyes:  Positive for pain. Negative for photophobia, discharge, redness, itching and visual disturbance.  Respiratory: Negative.     Cardiovascular: Negative.   Gastrointestinal: Negative.   Genitourinary:  Positive for difficulty urinating, dysuria and flank pain. Negative for decreased urine volume, dyspareunia, enuresis, frequency, genital sores, hematuria, menstrual problem, pelvic pain, urgency, vaginal bleeding, vaginal discharge and vaginal pain.  Musculoskeletal:  Positive for back pain. Negative for arthralgias, gait problem, joint swelling, myalgias, neck pain and neck stiffness.  Skin: Negative.     Physical Exam Triage Vital Signs ED Triage Vitals  Enc Vitals Group     BP 10/15/21 1142 135/82     Pulse Rate 10/15/21 1142 100     Resp 10/15/21 1142 16     Temp 10/15/21 1142 98.7 F (37.1 C)     Temp Source 10/15/21 1142 Oral     SpO2 10/15/21 1142 100 %     Weight 10/15/21 1143 135 lb 5.8 oz (61.4 kg)     Height 10/15/21 1143 5\' 2"  (1.575 m)     Head Circumference --      Peak Flow --  Pain Score 10/15/21 1143 10     Pain Loc --      Pain Edu? --      Excl. in GC? --    No data found.  Updated Vital Signs BP 135/82 (BP Location: Left Arm)    Pulse 100    Temp 98.7 F (37.1 C) (Oral)    Resp 16    Ht 5\' 2"  (1.575 m)    Wt 135 lb 5.8 oz (61.4 kg)    SpO2 100%    BMI 24.76 kg/m   Visual Acuity Right Eye Distance:   Left Eye Distance:   Bilateral Distance:    Right Eye Near:   Left Eye Near:    Bilateral Near:     Physical Exam Constitutional:      Appearance: Normal appearance.  HENT:     Right Ear: Tympanic membrane, ear canal and external ear normal.     Left Ear: Tympanic membrane, ear canal and external ear normal.     Nose: Rhinorrhea present.     Mouth/Throat:     Mouth: Mucous membranes are moist.     Pharynx: Posterior oropharyngeal erythema present.     Tonsils: No tonsillar exudate. 0 on the right. 0 on the left.  Eyes:     Extraocular Movements: Extraocular movements intact.  Cardiovascular:     Rate and Rhythm: Normal rate and regular rhythm.     Pulses: Normal  pulses.     Heart sounds: Normal heart sounds.  Pulmonary:     Effort: Pulmonary effort is normal.     Breath sounds: Normal breath sounds.  Chest:     Comments: Reproducible tenderness along the left chest wall, no swelling, ecchymosis, deformity noted, chest wall is symmetrical,, no breast tenderness, cracking or dimpling of the skin. Abdominal:     General: Abdomen is flat. Bowel sounds are normal.     Palpations: Abdomen is soft.     Tenderness: There is abdominal tenderness in the suprapubic area. There is right CVA tenderness and left CVA tenderness.  Skin:    General: Skin is warm and dry.  Neurological:     Mental Status: She is alert and oriented to person, place, and time. Mental status is at baseline.  Psychiatric:        Mood and Affect: Mood normal.        Behavior: Behavior normal.     UC Treatments / Results  Labs (all labs ordered are listed, but only abnormal results are displayed) Labs Reviewed  POCT URINALYSIS DIPSTICK, ED / UC    EKG   Radiology No results found.  Procedures Procedures (including critical care time)  Medications Ordered in UC Medications - No data to display  Initial Impression / Assessment and Plan / UC Course  I have reviewed the triage vital signs and the nursing notes.  Pertinent labs & imaging results that were available during my care of the patient were reviewed by me and considered in my medical decision making (see chart for details).  Costochondritis Viral pharyngitis Dysuria Suprapubic pressure  1.  Chest tenderness is reproducible on exam, S1 and S2 heard on auscultation and vital signs are stable, no cardiac history, low suspicion of more serious cardiac involvement, discussed with patient, recommended over-the-counter Tylenol for management of pain as patient endorses that she is unable to tolerate NSAIDs, recommended massage, stretching and heat in 15-minute intervals for additional comfort 2.  Rapid strep  negative, sent for culture, discussed findings  with patient, etiology of symptoms most likely viral, lidocaine viscous prescribed for comfort, recommended salt water gargles, Listerine gargles, throat lozenges, warm liquids, increase fluid intake for additional comfort 3.  Urinalysis negative, vaginal swab pending for rule out BV and yeast, discussed findings with patient, prior radium prescribed for management of symptoms until labs result, will treat per protocol Final Clinical Impressions(s) / UC Diagnoses   Final diagnoses:  None   Discharge Instructions   None    ED Prescriptions   None    PDMP not reviewed this encounter.   Hans Eden, NP 10/15/21 1226

## 2021-10-15 NOTE — ED Triage Notes (Addendum)
Pt presents with c/o lower back x 1 week and  Chest "throbbing" x 2 weeks. Pt states she started a new probiotic tea and has had trouble urinating. Pt reports "it feels like its hard to come out".  Pt also reports sore throat, nasal congestion and headache x 3 days.

## 2021-10-15 NOTE — Discharge Instructions (Addendum)
For your chest, this is most likely muscular and will have to slowly improve in time  -You may use Tylenol 500 to 1000 mg every 6 hours as needed,  -you may gently massage the area,  -you may place heat over the area in 15-minute intervals   For sore throat  -Strep throat negative, which means your symptoms are most likely viral and have to resolve with time -You may gargle and spit lidocaine solution every 4 hours as needed to give a temporary numbing sensation into your throat -You may attempt salt water gargles, Listerine gargles, warm liquids, teaspoons of honey, increasing fluid intake and throat lozenges until symptoms resolve  For your bladder symptoms  -Your urinalysis today was negative for bladder infection -We are completing a vaginal swab to check for a bacterial infection and yeast which may present as a urinary infection, it typically takes about 2 to 3 days for [place] to get those results if positive you will be notified via telephone and medication sent for use -In the meantime you may use primarily given, taking 3 times a day for the next 2 days to help with symptoms, you may continue use of Tylenol

## 2021-10-17 ENCOUNTER — Telehealth (HOSPITAL_COMMUNITY): Payer: Self-pay | Admitting: Emergency Medicine

## 2021-10-17 LAB — CERVICOVAGINAL ANCILLARY ONLY
Bacterial Vaginitis (gardnerella): NEGATIVE
Candida Glabrata: NEGATIVE
Candida Vaginitis: POSITIVE — AB
Comment: NEGATIVE
Comment: NEGATIVE
Comment: NEGATIVE

## 2021-10-17 LAB — CULTURE, GROUP A STREP (THRC)

## 2021-10-17 MED ORDER — FLUCONAZOLE 150 MG PO TABS: 150.0000 mg | ORAL_TABLET | Freq: Once | ORAL | 0 refills | Status: AC

## 2021-10-19 ENCOUNTER — Other Ambulatory Visit: Payer: Self-pay

## 2021-10-19 ENCOUNTER — Ambulatory Visit (INDEPENDENT_AMBULATORY_CARE_PROVIDER_SITE_OTHER): Payer: Managed Care, Other (non HMO) | Admitting: Internal Medicine

## 2021-10-19 ENCOUNTER — Other Ambulatory Visit (HOSPITAL_COMMUNITY): Payer: Self-pay

## 2021-10-19 ENCOUNTER — Encounter: Payer: Self-pay | Admitting: Internal Medicine

## 2021-10-19 VITALS — BP 130/80 | HR 106 | Temp 98.2°F | Ht 62.0 in | Wt 138.3 lb

## 2021-10-19 DIAGNOSIS — Z0000 Encounter for general adult medical examination without abnormal findings: Secondary | ICD-10-CM

## 2021-10-19 DIAGNOSIS — J069 Acute upper respiratory infection, unspecified: Secondary | ICD-10-CM

## 2021-10-19 NOTE — Assessment & Plan Note (Signed)
Patient is here today for a sore throat for over a week.  The patient states that she first noticed sore throat and worsening cough with green sputum at that time, prompting her to be seen in urgent care over the weekend.  At urgent care, she was tested for strep and found to be negative.  She was advised she had a strep pharyngitis and was discharged home with supportive care.  The patient states that her sore throat has not improved with home remedies.  She has tried Mucinex, Vicks, and Delsym, which did not alleviate her symptoms.  She has a history of asthma and states that her albuterol nebulizer helped at home but her inhalers did not alleviate her symptoms.  She has chest soreness from coughing and shortness of breath associated with the cough.  She tested negative for COVID at home and has received her flu shot this year.  On exam, patient is mildly tachycardic but is breathing well on room air and is able to speak in full sentences.  No wheezing or other adventitious lung sounds appreciated on exam.  Plan: I discussed with the patient that she likely has a viral URI.  I do not feel this is an acute worsening of her asthma given her normal exam.  Encouraged to continue home remedies such as Robitussin, lozenges, tea, breathing treatments.  Patient left the room prior to receiving her discharge paperwork.

## 2021-10-19 NOTE — Progress Notes (Signed)
° °  CC: Sore throat  HPI:  [name] is a 56 y.o. with past medical history as noted below who presents to the clinic today for a sore throat. Please see problem-based list for further details, assessments, and plans.  Past Medical History:  Diagnosis Date   Allergic rhinitis    Allergy    seasonal   Anxiety    Arthritis    back   Chronic bronchitis (HCC)    COPD (chronic obstructive pulmonary disease) (HCC)    Depression    Fibromyalgia    GERD (gastroesophageal reflux disease)    Hyperlipidemia    Low back pain    Review of Systems: Negative aside from that listed in individualized problem based charting.  Physical Exam:  Vitals:   10/19/21 1320  BP: 130/80  Pulse: (!) 106  Temp: 98.2 F (36.8 C)  TempSrc: Oral  SpO2: 100%  Weight: 138 lb 4.8 oz (62.7 kg)  Height: 5\' 2"  (1.575 m)   General: NAD, nl appearance, breathing comfortably on room air and able to speak in full sentences HE: Normocephalic, atraumatic, EOMI, Conjunctivae normal ENT: No congestion, no rhinorrhea, no exudate or erythema  Cardiovascular: Normal rate, regular rhythm. No murmurs, rubs, or gallops Pulmonary: Effort normal, breath sounds normal. No wheezes, rales, or rhonchi Abdominal: soft, nontender, bowel sounds present Musculoskeletal: no swelling, deformity, injury or tenderness in extremities Skin: Warm, dry, no bruising, erythema, or rash Psychiatric/Behavioral: normal mood, normal behavior     Assessment & Plan:   See Encounters Tab for problem based charting.  Patient discussed with Dr. Evette Doffing

## 2021-10-20 ENCOUNTER — Telehealth: Payer: Self-pay | Admitting: *Deleted

## 2021-10-20 NOTE — Addendum Note (Signed)
Addended by: Orvis Brill on: 10/20/2021 02:09 PM   Modules accepted: Orders

## 2021-10-20 NOTE — Progress Notes (Signed)
Internal Medicine Clinic Attending ° °Case discussed with Dr. Bonanno  °  At the time of the visit.  We reviewed the resident’s history and exam and pertinent patient test results.  I agree with the assessment, diagnosis, and plan of care documented in the resident’s note. ° °

## 2021-10-20 NOTE — Addendum Note (Signed)
Addended by: Andrey Campanile on: 10/20/2021 02:13 PM   Modules accepted: Orders

## 2021-10-20 NOTE — Telephone Encounter (Signed)
Patient called in requesting nebulizer supplies. States what she has currently is "old and dirty." Advised that these supplies are only good for 30 days. She states she hasn't needed to use her nebulizer in a long time till recently. Will send order to [facility].

## 2021-10-20 NOTE — Addendum Note (Signed)
Addended by: Andrey Campanile on: 10/20/2021 02:12 PM   Modules accepted: Orders

## 2021-11-09 NOTE — Telephone Encounter (Signed)
CM sent to [place] at [facility] for Nebulizer supplies. ?

## 2021-11-29 ENCOUNTER — Other Ambulatory Visit (HOSPITAL_COMMUNITY): Payer: Self-pay

## 2021-11-30 ENCOUNTER — Other Ambulatory Visit: Payer: Self-pay

## 2021-11-30 DIAGNOSIS — E785 Hyperlipidemia, unspecified: Secondary | ICD-10-CM

## 2021-12-01 ENCOUNTER — Other Ambulatory Visit (HOSPITAL_COMMUNITY): Payer: Self-pay

## 2021-12-01 MED ORDER — ROSUVASTATIN CALCIUM 10 MG PO TABS
10.0000 mg | ORAL_TABLET | Freq: Every day | ORAL | 3 refills | Status: DC
  Filled 2021-12-01: qty 90, 90d supply, fill #0
  Filled 2022-06-06: qty 90, 90d supply, fill #1

## 2021-12-05 ENCOUNTER — Other Ambulatory Visit (HOSPITAL_COMMUNITY): Payer: Self-pay

## 2021-12-07 ENCOUNTER — Other Ambulatory Visit (HOSPITAL_COMMUNITY): Payer: Self-pay

## 2022-03-04 ENCOUNTER — Encounter: Payer: Self-pay | Admitting: *Deleted

## 2022-05-03 ENCOUNTER — Other Ambulatory Visit (HOSPITAL_COMMUNITY): Payer: Self-pay

## 2022-06-06 ENCOUNTER — Ambulatory Visit (INDEPENDENT_AMBULATORY_CARE_PROVIDER_SITE_OTHER): Payer: Commercial Managed Care - HMO

## 2022-06-06 ENCOUNTER — Other Ambulatory Visit: Payer: Self-pay | Admitting: Internal Medicine

## 2022-06-06 ENCOUNTER — Other Ambulatory Visit (HOSPITAL_COMMUNITY): Payer: Self-pay

## 2022-06-06 DIAGNOSIS — J45990 Exercise induced bronchospasm: Secondary | ICD-10-CM

## 2022-06-06 DIAGNOSIS — Z23 Encounter for immunization: Secondary | ICD-10-CM

## 2022-06-07 ENCOUNTER — Other Ambulatory Visit (HOSPITAL_COMMUNITY): Payer: Self-pay

## 2022-06-07 MED ORDER — ALBUTEROL SULFATE (2.5 MG/3ML) 0.083% IN NEBU
2.5000 mg | INHALATION_SOLUTION | RESPIRATORY_TRACT | 6 refills | Status: DC
  Filled 2022-06-07: qty 75, 6d supply, fill #0

## 2022-06-07 MED ORDER — ALBUTEROL SULFATE HFA 108 (90 BASE) MCG/ACT IN AERS
2.0000 | INHALATION_SPRAY | Freq: Four times a day (QID) | RESPIRATORY_TRACT | 2 refills | Status: DC | PRN
  Filled 2022-06-07: qty 6.7, 20d supply, fill #0
  Filled 2022-06-08: qty 6.7, 25d supply, fill #0

## 2022-06-08 ENCOUNTER — Other Ambulatory Visit (HOSPITAL_COMMUNITY): Payer: Self-pay

## 2022-06-08 ENCOUNTER — Other Ambulatory Visit: Payer: Self-pay | Admitting: Student

## 2022-06-15 ENCOUNTER — Other Ambulatory Visit (HOSPITAL_COMMUNITY): Payer: Self-pay

## 2022-06-15 ENCOUNTER — Other Ambulatory Visit: Payer: Self-pay | Admitting: Internal Medicine

## 2022-06-15 DIAGNOSIS — J45990 Exercise induced bronchospasm: Secondary | ICD-10-CM

## 2022-06-15 MED ORDER — DULERA 100-5 MCG/ACT IN AERO
1.0000 | INHALATION_SPRAY | Freq: Two times a day (BID) | RESPIRATORY_TRACT | 3 refills | Status: DC
Start: 2022-06-15 — End: 2022-06-16
  Filled 2022-06-15: qty 13, 30d supply, fill #0

## 2022-06-16 ENCOUNTER — Other Ambulatory Visit: Payer: Self-pay | Admitting: Student

## 2022-06-16 ENCOUNTER — Other Ambulatory Visit (HOSPITAL_COMMUNITY): Payer: Self-pay

## 2022-06-16 DIAGNOSIS — J45990 Exercise induced bronchospasm: Secondary | ICD-10-CM

## 2022-06-16 MED ORDER — FLUTICASONE FUROATE-VILANTEROL 100-25 MCG/ACT IN AEPB
1.0000 | INHALATION_SPRAY | Freq: Every day | RESPIRATORY_TRACT | 2 refills | Status: DC
  Filled 2022-06-16: qty 60, 30d supply, fill #0

## 2022-06-16 NOTE — Progress Notes (Signed)
Dulera not on formulary.  Switch to [employer].  Try to call patient about the change, but no answer.

## 2022-06-27 ENCOUNTER — Other Ambulatory Visit (HOSPITAL_COMMUNITY): Payer: Self-pay

## 2022-07-21 ENCOUNTER — Other Ambulatory Visit (HOSPITAL_COMMUNITY): Payer: Self-pay

## 2022-11-03 ENCOUNTER — Ambulatory Visit (HOSPITAL_COMMUNITY)
Admission: EM | Admit: 2022-11-03 | Discharge: 2022-11-03 | Disposition: A | Discharge status: Home / Self Care | Payer: Medicaid Other | Attending: Physician Assistant | Admitting: Physician Assistant

## 2022-11-03 ENCOUNTER — Ambulatory Visit (INDEPENDENT_AMBULATORY_CARE_PROVIDER_SITE_OTHER): Payer: Medicaid Other

## 2022-11-03 ENCOUNTER — Encounter (HOSPITAL_COMMUNITY): Payer: Self-pay | Admitting: *Deleted

## 2022-11-03 DIAGNOSIS — R109 Unspecified abdominal pain: Secondary | ICD-10-CM

## 2022-11-03 DIAGNOSIS — K5900 Constipation, unspecified: Secondary | ICD-10-CM

## 2022-11-03 DIAGNOSIS — R140 Abdominal distension (gaseous): Secondary | ICD-10-CM

## 2022-11-03 MED ORDER — LACTULOSE 10 G PO PACK
10.0000 g | PACK | Freq: Every day | ORAL | 0 refills | Status: DC
Start: 2022-11-03 — End: 2023-02-16

## 2022-11-03 NOTE — Discharge Instructions (Signed)
Your abdominal x-ray showed significant stool burden I believe this is contributing to your symptoms.  I have called in lactulose.  Take this dissolved in water in the morning.  Make sure that you are drinking lots of fluid and increase fiber in your diet.  Call and schedule appoint with gastroenterology if this continues to be a problem.  If at any point have worsening symptoms including difficulty passing a bowel movement, difficulty passing gas, worsening abdominal pain, abdominal pain in one specific area of your abdomen, nausea/vomiting you need to go to the emergency room for further evaluation and management.

## 2022-11-03 NOTE — ED Provider Notes (Signed)
[place]    CSN: [id number] Arrival date & time: 11/03/22  [place]      History   Chief Complaint Chief Complaint  Patient presents with   Abdominal Pain   Nausea    HPI Briana Parker is a 57 y.o. female.   Patient presents today with a 5-day history of GI symptoms.  Reports she has had intermittent abdominal pain that has been worse on the left side of her abdomen but involves the majority of her abdomen, described as cramping/sharp, worse in certain positions, no alleviating factors identified.  She has tried over-the-counter medication such as acid reflux medicine without improvement of symptoms.  Denies any suspicious food intake, recent travel, known sick contacts.  She does report associated nausea but denies any vomiting.  Reports that she did an enema and took a stool softener which allowed her to pass a small amount of stool earlier today but generally has not been having regular bowel movements.  She denies history of constipation or previous obstruction.  Denies previous abdominal surgery other than hysterectomy.  She denies any urinary symptoms.  She has not seen a GI specialist in the past.    Past Medical History:  Diagnosis Date   Allergic rhinitis    Allergy    seasonal   Anxiety    Arthritis    back   Chronic bronchitis (HCC)    COPD (chronic obstructive pulmonary disease) ([place])    Depression    Fibromyalgia    GERD (gastroesophageal reflux disease)    Hyperlipidemia    Low back pain     Patient Active Problem List   Diagnosis Date Noted   Healthcare maintenance 10/19/2021   Vasovagal syncopes 04/20/2021   Foot pain, right 04/20/2021   Pruritus 11/04/2019   Facial rash 11/04/2019   Achilles tendinitis of both lower extremities 10/10/2018   Trigger finger, left middle finger 07/22/2018   Dry eye 04/15/2018   Shoulder pain (Bilateral) 10/10/2017   Gastroesophageal reflux disease without esophagitis 10/10/2017   Asthma, exercise induced  04/13/2017   Upper respiratory infection 09/12/2016   Chronic low back pain 10/06/2015   Carpal tunnel syndrome, bilateral 02/12/2015   Menopausal syndrome (hot flashes) with mood lability, depression, sleep disturbance, and vaginal dryness  04/15/2014   Screening for diabetes mellitus 11/10/2013   Sinusitis, chronic 05/29/2013   HEMORRHOIDS 05/08/2007   Hyperlipidemia 07/13/2006    Past Surgical History:  Procedure Laterality Date   ABDOMINAL HYSTERECTOMY     CARPAL TUNNEL RELEASE Right 05/05/2015   Procedure: RIGHT CARPAL TUNNEL RELEASE;  Surgeon: Leandrew Koyanagi, MD;  Location: [facility];  Service: Orthopedics;  Laterality: Right;   TRIGGER FINGER RELEASE Left 05/05/2015   Procedure: LEFT TRIGGER THUMB RELEASE;  Surgeon: Leandrew Koyanagi, MD;  Location: [facility];  Service: Orthopedics;  Laterality: Left;   WISDOM TOOTH EXTRACTION      OB History   No obstetric history on file.      Home Medications    Prior to Admission medications   Medication Sig Start Date End Date Taking? Authorizing Provider  albuterol (PROVENTIL) (2.5 MG/3ML) 0.083% nebulizer solution Take 3 mLs (2.5 mg total) by nebulization every 6 hours as needed for wheezing or shortness of breath. 06/07/22  Yes Nani Gasser, MD  albuterol (VENTOLIN HFA) 108 (90 Base) MCG/ACT inhaler Inhale 2 puffs into the lungs 4 (four) times daily as needed for wheezing or shortness of breath. 06/07/22  Yes Nani Gasser, MD  fluticasone furoate-vilanterol (BREO ELLIPTA) 100-25 MCG/ACT AEPB Inhale 1 puff into the lungs daily. 06/16/22  Yes Nani Gasser, MD  lactulose (CEPHULAC) 10 g packet Take 1 packet (10 g total) by mouth daily. 11/03/22  Yes Tannon Peerson K, PA-C  rosuvastatin (CRESTOR) 10 MG tablet Take 1 tablet (10 mg total) by mouth daily. 12/01/21  Yes Orvis Brill, MD  PREMARIN vaginal cream PLACE 0.5 GM OF CREAM VAGINALLY 2 TIMES A WEEK 09/15/20   Jeralyn Bennett, MD    Family  History Family History  Problem Relation Age of Onset   Diabetes Mother        died recently in 01-28-10 due to 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  She was on dialysis and died with cardiac arrest as per pt.   Heart disease Father    Hypertension Father    Coronary artery disease Other        Strong family history in first degree relatives   Colon cancer Neg Hx     Social History Social History   Tobacco Use   Smoking status: Never   Smokeless tobacco: Never  Substance Use Topics   Alcohol use: No    Alcohol/week: 0.0 standard drinks of alcohol   Drug use: No     Allergies   Augmentin [amoxicillin-pot clavulanate], Codeine, Hydrocodone, Ibuprofen, Nortriptyline, Pravastatin sodium, and Simvastatin   Review of Systems Review of Systems  Constitutional:  Positive for activity change. Negative for appetite change, fatigue and fever.  Gastrointestinal:  Positive for abdominal pain and nausea. Negative for blood in stool, constipation, diarrhea and vomiting.  Genitourinary:  Negative for dysuria, frequency, urgency, vaginal bleeding, vaginal discharge and vaginal pain.     Physical Exam Triage Vital Signs ED Triage Vitals  Enc Vitals Group     BP 11/03/22 1758 (!) 140/86     Pulse Rate 11/03/22 1758 92     Resp 11/03/22 1758 20     Temp 11/03/22 1758 98.2 F (36.8 C)     Temp Source 11/03/22 1758 Oral     SpO2 11/03/22 1758 98 %     Weight --      Height --      Head Circumference --      Peak Flow --      Pain Score 11/03/22 1755 10     Pain Loc --      Pain Edu? --      Excl. in [place]? --    No data found.  Updated Vital Signs BP (!) 140/86 (BP Location: Right Arm)   Pulse 92   Temp 98.2 F (36.8 C) (Oral)   Resp 20   SpO2 98%   Visual Acuity Right Eye Distance:   Left Eye Distance:   Bilateral Distance:    Right Eye Near:   Left Eye Near:    Bilateral Near:     Physical Exam Vitals reviewed.  Constitutional:      General: She is awake. She is not in acute  distress.    Appearance: Normal appearance. She is well-developed. She is not ill-appearing.     Comments: Very pleasant female appears stated age in no acute distress sitting comfortably in exam room  HENT:     Head: Normocephalic and atraumatic.  Cardiovascular:     Rate and Rhythm: Normal rate and regular rhythm.     Heart sounds: Normal heart sounds, S1 normal and S2 normal. No murmur heard. Pulmonary:     Effort: Pulmonary effort is normal.  Breath sounds: Normal breath sounds. No wheezing, rhonchi or rales.     Comments: Clear to auscultation bilaterally Abdominal:     General: Bowel sounds are normal.     Palpations: Abdomen is soft.     Tenderness: There is abdominal tenderness in the left upper quadrant and left lower quadrant. There is no right CVA tenderness, left CVA tenderness, guarding or rebound.     Comments: Mild tenderness palpation over left abdomen.  No evidence of acute abdomen on physical exam.  Psychiatric:        Behavior: Behavior is cooperative.      UC Treatments / Results  Labs (all labs ordered are listed, but only abnormal results are displayed) Labs Reviewed - No data to display  EKG   Radiology DG Abdomen 1 View  Result Date: 11/03/2022 CLINICAL DATA:  Abdominal pain and swelling. EXAM: ABDOMEN - 1 VIEW COMPARISON:  None Available. FINDINGS: The bowel gas pattern is normal. A large amount of stool is seen within the ascending colon and proximal descending colon. No radio-opaque calculi or other significant radiographic abnormality are seen. IMPRESSION: Large stool burden without evidence of bowel obstruction. Electronically Signed   By: Virgina Norfolk M.D.   On: 11/03/2022 19:02    Procedures Procedures (including critical care time)  Medications Ordered in UC Medications - No data to display  Initial Impression / Assessment and Plan / UC Course  I have reviewed the triage vital signs and the nursing notes.  Pertinent labs & imaging  results that were available during my care of the patient were reviewed by me and considered in my medical decision making (see chart for details).     Patient is well-appearing, afebrile, nontoxic, nontachycardic.  Vital signs and physical exam are reassuring today; no indication for emergent evaluation or imaging.  KUB was obtained that showed significant stool burden.  Discussed this is likely contributing to her symptoms.  Will start lactulose.  Encouraged her to drink plenty of fluid and increase fiber in her diet.  She was given contact information for GI specialist should symptoms persist.  We discussed that if she has any worsening symptoms including severe abdominal pain, focal abdominal pain, nausea, vomiting, fever she should go to the emergency room for more advanced imaging since we do not have these capabilities.  Strict return precautions given.  Final Clinical Impressions(s) / UC Diagnoses   Final diagnoses:  Constipation, unspecified constipation type  Abdominal bloating     Discharge Instructions      Your abdominal x-ray showed significant stool burden I believe this is contributing to your symptoms.  I have called in lactulose.  Take this dissolved in water in the morning.  Make sure that you are drinking lots of fluid and increase fiber in your diet.  Call and schedule appoint with gastroenterology if this continues to be a problem.  If at any point have worsening symptoms including difficulty passing a bowel movement, difficulty passing gas, worsening abdominal pain, abdominal pain in one specific area of your abdomen, nausea/vomiting you need to go to the emergency room for further evaluation and management.     ED Prescriptions     Medication Sig Dispense Auth. Provider   lactulose (CEPHULAC) 10 g packet Take 1 packet (10 g total) by mouth daily. 10 each Deette Revak, Derry Skill, PA-C      PDMP not reviewed this encounter.   Terrilee Croak, PA-C 11/03/22 1921

## 2022-11-03 NOTE — ED Triage Notes (Signed)
Pt states that she has been having abdominal pain and swelling since Sunday. She said her abdomin swells up like she was pregnant she states she had a BM today. She has a lot of nausea and cramping.

## 2023-02-15 ENCOUNTER — Telehealth (INDEPENDENT_AMBULATORY_CARE_PROVIDER_SITE_OTHER): Payer: Self-pay | Admitting: Primary Care

## 2023-02-15 NOTE — Telephone Encounter (Signed)
Spoke with pt. Will be present at apt.

## 2023-02-16 ENCOUNTER — Encounter (INDEPENDENT_AMBULATORY_CARE_PROVIDER_SITE_OTHER): Payer: Self-pay | Admitting: Primary Care

## 2023-02-16 ENCOUNTER — Ambulatory Visit (INDEPENDENT_AMBULATORY_CARE_PROVIDER_SITE_OTHER): Payer: Medicaid Other | Admitting: Primary Care

## 2023-02-16 ENCOUNTER — Telehealth (INDEPENDENT_AMBULATORY_CARE_PROVIDER_SITE_OTHER): Payer: Self-pay | Admitting: Primary Care

## 2023-02-16 VITALS — BP 133/89 | HR 94 | Temp 97.8°F | Resp 16 | Ht 62.0 in | Wt 145.0 lb

## 2023-02-16 DIAGNOSIS — K219 Gastro-esophageal reflux disease without esophagitis: Secondary | ICD-10-CM

## 2023-02-16 DIAGNOSIS — Z7689 Persons encountering health services in other specified circumstances: Secondary | ICD-10-CM | POA: Diagnosis not present

## 2023-02-16 DIAGNOSIS — M5442 Lumbago with sciatica, left side: Secondary | ICD-10-CM

## 2023-02-16 DIAGNOSIS — G8929 Other chronic pain: Secondary | ICD-10-CM

## 2023-02-16 DIAGNOSIS — E559 Vitamin D deficiency, unspecified: Secondary | ICD-10-CM | POA: Diagnosis not present

## 2023-02-16 DIAGNOSIS — E785 Hyperlipidemia, unspecified: Secondary | ICD-10-CM

## 2023-02-16 DIAGNOSIS — Z131 Encounter for screening for diabetes mellitus: Secondary | ICD-10-CM | POA: Diagnosis not present

## 2023-02-16 DIAGNOSIS — R030 Elevated blood-pressure reading, without diagnosis of hypertension: Secondary | ICD-10-CM | POA: Diagnosis not present

## 2023-02-16 NOTE — Patient Instructions (Signed)
Preventing Hypertension Hypertension, also called high blood pressure, is when the force of blood pumping through the arteries is too strong. Arteries are blood vessels that carry blood from the heart throughout the body. Often, hypertension does not cause symptoms until blood pressure is very high. It is important to have your blood pressure checked regularly. Diet and lifestyle changes can help you prevent hypertension, and they may make you feel better overall and improve your quality of life. If you already have hypertension, you may control it with diet and lifestyle changes, as well as with medicine. How can this condition affect me? Over time, hypertension can damage the arteries and decrease blood flow to important parts of the body, including the brain, heart, and kidneys. By keeping your blood pressure in a healthy range, you can help prevent complications like heart attack, heart failure, stroke, kidney failure, and vascular dementia. What can increase my risk? An unhealthy diet and a lack of physical activity can make you more likely to develop high blood pressure. Some other risk factors include: Age. The risk increases with age. Having family members who have had high blood pressure. Having certain health conditions, such as thyroid problems. Being overweight or obese. Drinking too much alcohol or caffeine. Having too much fat, sugar, calories, or salt (sodium) in your diet. Smoking or using illegal drugs. Taking certain medicines, such as antidepressants, decongestants, birth control pills, and NSAIDs, such as ibuprofen. What actions can I take to prevent or manage this condition? Work with your health care provider to make a hypertension prevention plan that works for you. You may be referred for counseling on a healthy diet and physical activity. Follow your plan and keep all follow-up visits. Diet changes Maintain a healthy diet. This includes: Eating less salt (sodium). Ask your  health care provider how much sodium is safe for you to have. The general recommendation is to have less than 1 tsp (2,300 mg) of sodium a day. Do not add salt to your food. Choose low-sodium options when grocery shopping and eating out. Limiting fats in your diet. You can do this by eating low-fat or fat-free dairy products and by eating less red meat. Eating more fruits, vegetables, and whole grains. Make a goal to eat: 1-2 cups of fresh fruits and vegetables each day. 3-4 servings of whole grains each day. Avoiding foods and beverages that have added sugars. Eating fish that contain healthy fats (omega-3 fatty acids), such as mackerel or salmon. If you need help putting together a healthy eating plan, try the DASH diet. This diet is high in fruits, vegetables, and whole grains. It is low in sodium, red meat, and added sugars. DASH stands for Dietary Approaches to Stop Hypertension. Lifestyle changes  Lose weight if you are overweight. Losing just 3-5% of your body weight can help prevent or control hypertension. For example, if your present weight is 200 lb (91 kg), a loss of 3-5% of your weight means losing 6-10 lb (2.7-4.5 kg). Ask your health care provider to help you with a diet and exercise plan to safely lose weight. Get enough exercise. Do at least 150 minutes of moderate-intensity exercise each week. You could do this in short exercise sessions several times a day, or you could do longer exercise sessions a few times a week. For example, you could take a brisk 10-minute walk or bike ride, 3 times a day, for 5 days a week. Find ways to reduce stress, such as exercising, meditating, listening to   music, or taking a yoga class. If you need help reducing stress, ask your health care provider. Do not use any products that contain nicotine or tobacco. These products include cigarettes, chewing tobacco, and vaping devices, such as e-cigarettes. Chemicals in tobacco and nicotine products raise your  blood pressure each time you use them. If you need help quitting, ask your health care provider. Learn how to check your blood pressure at home. Make sure that you know your personal target blood pressure, as told by your health care provider. Try to sleep 7-9 hours per night. Alcohol use Do not drink alcohol if: Your health care provider tells you not to drink. You are pregnant, may be pregnant, or are planning to become pregnant. If you drink alcohol: Limit how much you have to: 0-1 drink a day for women. 0-2 drinks a day for men. Know how much alcohol is in your drink. In the [place] one drink equals one 12 oz bottle of beer (355 mL), one 5 oz glass of wine (148 mL), or one 1 oz glass of hard liquor (44 mL). Medicines In addition to diet and lifestyle changes, your health care provider may recommend medicines to help lower your blood pressure. In general: You may need to try a few different medicines to find what works best for you. You may need to take more than one medicine. Take over-the-counter and prescription medicines only as told by your health care provider. Questions to ask your health care provider What is my blood pressure goal? How can I lower my risk for high blood pressure? How should I monitor my blood pressure at home? Where to find support Your health care provider can help you prevent hypertension and help you keep your blood pressure at a healthy level. Your local hospital or your community may also provide support services and prevention programs. The American Heart Association offers an online support network at supportnetwork.heart.org Where to find more information Learn more about hypertension from: National Heart, Lung, and Blood Institute: www.nhlbi.nih.gov Centers for Disease Control and Prevention: www.cdc.gov American Academy of Family Physicians: familydoctor.org Learn more about the DASH diet from: National Heart, Lung, and Blood Institute:  www.nhlbi.nih.gov Contact a health care provider if: You think you are having a reaction to medicines you have taken. You have recurrent headaches or feel dizzy. You have swelling in your ankles. You have trouble with your vision. Get help right away if: You have sudden, severe chest, back, or abdominal pain or discomfort. You have shortness of breath. You have a sudden, severe headache. These symptoms may be an emergency. Get help right away. Call 911. Do not wait to see if the symptoms will go away. Do not drive yourself to the hospital. Summary Hypertension often does not cause any symptoms until blood pressure is very high. It is important to get your blood pressure checked regularly. Diet and lifestyle changes are important steps in preventing hypertension. By keeping your blood pressure in a healthy range, you may prevent complications like heart attack, heart failure, stroke, and kidney failure. Work with your health care provider to make a hypertension prevention plan that works for you. This information is not intended to replace advice given to you by your health care provider. Make sure you discuss any questions you have with your health care provider. Document Revised: 06/16/2021 Document Reviewed: 06/16/2021 Elsevier Patient Education  2024 Elsevier Inc.  

## 2023-02-16 NOTE — Progress Notes (Signed)
Established Patient Office Visit  Subjective   Patient ID: Briana Parker    DOB: [birth year]  Age: 57 y.o. MRN: [id number]  HPI  [name] is a 57 y.o. over weight female with past medical history as noted below who presents to establish care. She has several concerns-with hernia- hurts and she has change her diet no fried foods stays bloated and miserable. 2- low back pain -30 years every since she had her epidural with her daughter left side buttocks and radiates down leg 10/10. Chest left-side costochondritis able to replicated pain. Explains her husband and daughter had a shot now their pain is gone. Cervical pain states knots unable to palpate but can be related to stress. States she is not stress.  Active Ambulatory Problems    Diagnosis Date Noted   Hyperlipidemia 07/13/2006   HEMORRHOIDS 05/08/2007   Sinusitis, chronic 05/29/2013   Screening for diabetes mellitus 11/10/2013   Menopausal syndrome (hot flashes) with mood lability, depression, sleep disturbance, and vaginal dryness  04/15/2014   Carpal tunnel syndrome, bilateral 02/12/2015   Chronic low back pain 10/06/2015   Upper respiratory infection 09/12/2016   Asthma, exercise induced 04/13/2017   Shoulder pain (Bilateral) 10/10/2017   Gastroesophageal reflux disease without esophagitis 10/10/2017   Trigger finger, left middle finger 07/22/2018   Achilles tendinitis of both lower extremities 10/10/2018   Pruritus 11/04/2019   Facial rash 11/04/2019   Vasovagal syncopes 04/20/2021   Foot pain, right 04/20/2021   Healthcare maintenance 10/19/2021   Resolved Ambulatory Problems    Diagnosis Date Noted   Peripheral neuropathy 09/29/2008   ALLERGIC RHINITIS 08/19/2006   Exercise-induced bronchospasm 04/09/2007   Pain in joint, forearm 04/09/2007   NECK PAIN, RIGHT 03/09/2008   LOW BACK PAIN 07/13/2006   Muscle spasm of shoulder region 12/02/2009   INSOMNIA 09/29/2008   Palpitations 09/29/2008   ABDOMINAL  DISTENSION 10/27/2008   Dental caries 10/16/2012   Vaginal candidiasis 05/29/2013   Flu-like symptoms 09/24/2013   Acute sinus infection 09/24/2013   Family history of diabetes mellitus 11/10/2013   Acute recurrent pansinusitis 05/11/2014   Constipation 07/15/2014   Chest pain 08/11/2014   Fatigue 02/11/2015   Trigger finger, left 03/29/2015   Carpal tunnel syndrome 04/14/2015   Trigger finger, right 08/11/2015   Pain, dental 01/17/2016   Visual blurriness 01/17/2016   Acute bronchitis 09/12/2016   Polydipsia 09/12/2016   Hearing loss 09/12/2016   Flexor tenosynovitis of finger 04/13/2017   Prediabetes 04/13/2017   Dry eye 04/15/2018   Past Medical History:  Diagnosis Date   Allergic rhinitis    Allergy    Anxiety    Arthritis    Chronic bronchitis (HCC)    COPD (chronic obstructive pulmonary disease) (HCC)    Depression    Fibromyalgia    GERD (gastroesophageal reflux disease)    Low back pain      ROS  Comprehensive ROS Pertinent positive and negative noted in HPI     Objective:  Blood Pressure 133/89 (BP Location: Right Arm, Patient Position: Sitting, Cuff Size: Normal)   Pulse 94   Temperature 97.8 F (36.6 C) (Oral)   Respiration 16   Height 5\' 2"  (1.575 m)   Weight 145 lb (65.8 kg)   Oxygen Saturation 98%   Body Mass Index 26.52 kg/m    Physical Exam Vitals reviewed.  Constitutional:      Appearance: Normal appearance.  HENT:     Head: Normocephalic.     Right Ear: Tympanic membrane  and external ear normal.     Left Ear: Tympanic membrane and external ear normal.     Nose: Nose normal.  Eyes:     Extraocular Movements: Extraocular movements intact.     Pupils: Pupils are equal, round, and reactive to light.  Cardiovascular:     Rate and Rhythm: Normal rate and regular rhythm.  Pulmonary:     Effort: Pulmonary effort is normal.     Breath sounds: Normal breath sounds.  Abdominal:     General: Bowel sounds are normal.     Palpations: Abdomen  is soft.  Musculoskeletal:        General: Normal range of motion.     Cervical back: Normal range of motion.  Skin:    General: Skin is warm and dry.  Neurological:     Mental Status: She is alert and oriented to person, place, and time.  Psychiatric:        Mood and Affect: Mood normal.        Behavior: Behavior normal.      No results found for any visits on 07/20/22.  The ASCVD Risk score (Arnett DK, et al., 2019) failed to calculate for the following reasons:   The systolic blood pressure is missing   Cannot find a previous HDL lab   Cannot find a previous total cholesterol lab    Assessment & Plan:  [name] was seen today for new patient (initial visit).  Diagnoses and all orders for this visit:  Encounter to establish care -     CBC with Differential/Platelet  Hyperlipidemia, unspecified hyperlipidemia type -     Lipid panel  Chronic bilateral low back pain with left-sided sciatica -     Ambulatory referral to Orthopedics  Blood pressure elevated without history of HTN -     Comprehensive metabolic panel  Gastroesophageal reflux disease without esophagitis  Screening for diabetes mellitus -     Hemoglobin A1c  Vitamin D deficiency -     VITAMIN D 25 Hydroxy (Vit-D Deficiency, Fractures)      Grayce Sessions, NP

## 2023-02-16 NOTE — Telephone Encounter (Signed)
Will forward to provider  

## 2023-02-16 NOTE — Telephone Encounter (Signed)
Pt called requesting to start Omeprazole 20 MG, says she forgot to mention this during her appt  Medication Refill - Medication: omeprazole (PRILOSEC) 20 MG capsule   + Pain medication for her legs/feet. Says PCP is well aware of her situation per appt today  Has the patient contacted their pharmacy? Yes.   (Agent: If no, request that the patient contact the pharmacy for the refill. If patient does not wish to contact the pharmacy document the reason why and proceed with request.) (Agent: If yes, when and what did the pharmacy advise?)  Preferred Pharmacy (with phone number or street name):  [id number] - [place] ([place]), [place] - [address]  [address] [place] [address]  Phone: [phone] Fax: [phone]   Has the patient been seen for an appointment in the last year OR does the patient have an upcoming appointment? Yes.    Agent: Please be advised that RX refills may take up to 3 business days. We ask that you follow-up with your pharmacy.

## 2023-02-16 NOTE — Progress Notes (Signed)
Wants chemistry labs and cholesterol   Concerns with bloating soon after a meal. Was informed she has a hernia  Fungus in the fingernails  Low back pain that radiates to left leg  Left breast hurt  Right foot discomfort

## 2023-02-17 ENCOUNTER — Other Ambulatory Visit (INDEPENDENT_AMBULATORY_CARE_PROVIDER_SITE_OTHER): Payer: Self-pay | Admitting: Primary Care

## 2023-02-17 LAB — CBC WITH DIFFERENTIAL/PLATELET
Basophils Absolute: 0 10*3/uL (ref 0.0–0.2)
Basos: 1 %
EOS (ABSOLUTE): 0.1 10*3/uL (ref 0.0–0.4)
Eos: 2 %
Hematocrit: 41.9 % (ref 34.0–46.6)
Hemoglobin: 13.9 g/dL (ref 11.1–15.9)
Immature Grans (Abs): 0 10*3/uL (ref 0.0–0.1)
Immature Granulocytes: 0 %
Lymphocytes Absolute: 1.9 10*3/uL (ref 0.7–3.1)
Lymphs: 42 %
MCH: 28.9 pg (ref 26.6–33.0)
MCHC: 33.2 g/dL (ref 31.5–35.7)
MCV: 87 fL (ref 79–97)
Monocytes Absolute: 0.4 10*3/uL (ref 0.1–0.9)
Monocytes: 8 %
Neutrophils Absolute: 2.1 10*3/uL (ref 1.4–7.0)
Neutrophils: 47 %
Platelets: 264 10*3/uL (ref 150–450)
RBC: 4.81 x10E6/uL (ref 3.77–5.28)
RDW: 12.4 % (ref 11.7–15.4)
WBC: 4.4 10*3/uL (ref 3.4–10.8)

## 2023-02-17 LAB — COMPREHENSIVE METABOLIC PANEL
ALT: 18 IU/L (ref 0–32)
AST: 21 IU/L (ref 0–40)
Albumin/Globulin Ratio: 1.7 (ref 1.2–2.2)
Albumin: 4.3 g/dL (ref 3.8–4.9)
Alkaline Phosphatase: 97 IU/L (ref 44–121)
BUN/Creatinine Ratio: 14 (ref 9–23)
BUN: 11 mg/dL (ref 6–24)
Bilirubin Total: 0.3 mg/dL (ref 0.0–1.2)
CO2: 21 mmol/L (ref 20–29)
Calcium: 9.6 mg/dL (ref 8.7–10.2)
Chloride: 102 mmol/L (ref 96–106)
Creatinine, Ser: 0.79 mg/dL (ref 0.57–1.00)
Globulin, Total: 2.6 g/dL (ref 1.5–4.5)
Glucose: 81 mg/dL (ref 70–99)
Potassium: 4.7 mmol/L (ref 3.5–5.2)
Sodium: 139 mmol/L (ref 134–144)
Total Protein: 6.9 g/dL (ref 6.0–8.5)
eGFR: 86 mL/min/{1.73_m2} (ref >59)

## 2023-02-17 LAB — LIPID PANEL
Chol/HDL Ratio: 3 ratio (ref 0.0–4.4)
Cholesterol, Total: 180 mg/dL (ref 100–199)
HDL: 61 mg/dL (ref >39)
LDL Chol Calc (NIH): 100 mg/dL — ABNORMAL HIGH (ref 0–99)
Triglycerides: 106 mg/dL (ref 0–149)
VLDL Cholesterol Cal: 19 mg/dL (ref 5–40)

## 2023-02-17 LAB — HEMOGLOBIN A1C
Est. average glucose Bld gHb Est-mCnc: 131 mg/dL
Hgb A1c MFr Bld: 6.2 % — ABNORMAL HIGH (ref 4.8–5.6)

## 2023-02-17 LAB — VITAMIN D 25 HYDROXY (VIT D DEFICIENCY, FRACTURES): Vit D, 25-Hydroxy: 40.7 ng/mL (ref 30.0–100.0)

## 2023-02-17 MED ORDER — OMEPRAZOLE 40 MG PO CPDR: DELAYED_RELEASE_CAPSULE | ORAL | 0 refills | Status: DC

## 2023-02-17 MED ORDER — ROSUVASTATIN CALCIUM 5 MG PO TABS: 10.0000 mg | ORAL_TABLET | Freq: Every day | ORAL | 1 refills | Status: DC

## 2023-02-23 ENCOUNTER — Ambulatory Visit: Payer: Medicaid Other | Admitting: Physical Medicine and Rehabilitation

## 2023-02-23 ENCOUNTER — Other Ambulatory Visit (INDEPENDENT_AMBULATORY_CARE_PROVIDER_SITE_OTHER): Payer: Self-pay | Admitting: *Deleted

## 2023-02-23 ENCOUNTER — Other Ambulatory Visit (INDEPENDENT_AMBULATORY_CARE_PROVIDER_SITE_OTHER): Payer: Self-pay | Admitting: Primary Care

## 2023-02-23 ENCOUNTER — Telehealth (INDEPENDENT_AMBULATORY_CARE_PROVIDER_SITE_OTHER): Payer: Self-pay | Admitting: Primary Care

## 2023-02-23 ENCOUNTER — Telehealth (INDEPENDENT_AMBULATORY_CARE_PROVIDER_SITE_OTHER): Payer: Self-pay | Admitting: *Deleted

## 2023-02-23 DIAGNOSIS — J45990 Exercise induced bronchospasm: Secondary | ICD-10-CM

## 2023-02-23 DIAGNOSIS — G8929 Other chronic pain: Secondary | ICD-10-CM | POA: Diagnosis not present

## 2023-02-23 DIAGNOSIS — M5416 Radiculopathy, lumbar region: Secondary | ICD-10-CM

## 2023-02-23 DIAGNOSIS — M5116 Intervertebral disc disorders with radiculopathy, lumbar region: Secondary | ICD-10-CM

## 2023-02-23 MED ORDER — BACLOFEN 10 MG PO TABS: 10.0000 mg | ORAL_TABLET | Freq: Three times a day (TID) | ORAL | 0 refills | Status: DC

## 2023-02-23 NOTE — Telephone Encounter (Addendum)
  Chief Complaint: Results Symptoms: NA Frequency: NA Pertinent Negatives: Patient denies NA Disposition: [] ED /[] Urgent Care (no appt availability in office) / [] Appointment(In office/virtual)/ []  [facility]/ [] Home Care/ [] Refused Recommended Disposition /[]  Mobile Bus/ []  Follow-up with PCP Additional Notes:   Pt states saw lab results in MyChart, requested review "I just don't understand them." Extensive review of labs and recommendations. Also wished to review medication; reviewed to pt's satisfaction. Pt states she cannot swallow capsules of Omeprazole, requesting pill form.  States needs refills of Albuterol inhale and nebs. Sent to [facility] refill pool for review.

## 2023-02-23 NOTE — Progress Notes (Unsigned)
Briana Parker - 57 y.o. female MRN [id number]  Date of birth: 29-Nov-1965  Office Visit Note: Visit Date: 02/23/2023 PCP: Grayce Sessions, NP Referred by: Grayce Sessions, NP  Subjective: Chief Complaint  Patient presents with   Lower Back - Pain   HPI: Briana Parker is a 57 y.o. female who comes in today per the request of Briana Passe, NP for evaluation of chronic, worsening and severe bilateral lower back pain radiating to left buttock down posterolateral leg to foot. Pain ongoing for several years, worsens with sitting and laying down. She describes pain as constant throbbing sensation, currently rates 10 out of 10. Some relief of pain with home exercise regimen, rest and use of medications. History of formal physical therapy with minimal relief of pain. No history of lumbar surgery. She recalls possible lumbar epidural steroid injection many years ago that was not beneficial in alleviating her pain. Lumbar MRI imaging from 2013 exhibits large right paracentral disc protrusion at L4-L5 resulting in  moderate right lateral recess narrowing, displacing the right L5 nerve root. Patient denies focal weakness, numbness and tingling. No recent trauma or falls.     Review of Systems  Musculoskeletal:  Positive for back pain.  Neurological:  Negative for tingling, sensory change, focal weakness and weakness.   Otherwise per HPI.  Assessment & Plan: Visit Diagnoses:    ICD-10-CM   1. Lumbar radiculopathy  M54.16 MR LUMBAR SPINE WO CONTRAST    2. Intervertebral disc disorders with radiculopathy, lumbar region  M51.16 MR LUMBAR SPINE WO CONTRAST    3. Chronic bilateral low back pain with left-sided sciatica  M54.42 MR LUMBAR SPINE WO CONTRAST   G89.29        Plan: Findings:  Chronic, worsening and severe bilateral lower back pain radiating to left buttock down posterolateral leg to foot. Patient continues to have severe pain despite good conservative therapies such as  formal physical therapy, home exercise regimen, rest and use of medications. Patients clinical presentation and exam are consistent with L5 nerve pattern. Prior lumbar MRI imaging from 2013 exhibits large right paracentral disc protrusion at the level of L4-L5. I discussed treatment plan with patient in detail today. Given outdated lumbar MRI imaging and  symptoms that now seem to be more left sided the next step is to obtain new lumbar MRI imaging. Depending on results of imaging I discussed performing lumbar epidural steroid injection. I explained injection procedure to her today, she has no questions at this time. I will have patient follow up for lumbar MRI review. Could always consider re-grouping with our in house physical therapy team as well. I discussed medication management with her today, patient voiced she did well with baclofen in the past, I did go ahead and refill for her today. Patient encouraged to remain active as tolerated. No red flag symptoms noted upon exam today.     Meds & Orders:  Meds ordered this encounter  Medications   baclofen (LIORESAL) 10 MG tablet    Sig: Take 1 tablet (10 mg total) by mouth 3 (three) times daily.    Dispense:  30 each    Refill:  0    Orders Placed This Encounter  Procedures   MR LUMBAR SPINE WO CONTRAST    Follow-up: Return for follow up for lumbar MRI review.   Procedures: No procedures performed      Clinical History: Clinical Data: Low back pain.  Right leg pain.   MRI LUMBAR SPINE WITHOUT CONTRAST  Technique:  Multiplanar and multiecho pulse sequences of the lumbar  spine were obtained without intravenous contrast.   Comparison: Lumbar radiographs 05/20/2009.   Findings: Normal signal is present in the conus medullaris which  terminates at L1. Marrow signal, vertebral body heights, and  alignment are normal.  Minimal end plate changes are present at L3-  4 and L4-5.  There is some straightening of the normal lumbar  lordosis  as seen on the prior images.  Limited imaging of the  abdomen is unremarkable.   The disc levels at L2-3 and above are normal.   L3-4:  Leftward disc bulging is present.  No significant stenosis  is evident.   L4-5:  A prominent right paracentral disc protrusion results in  moderate right lateral recess narrowing.  The disc extends into the  right neural foramen with mild right foraminal narrowing.   L5-S1:  A leftward disc herniation is present.  This results in  mild left foraminal stenosis.  The central canal and right foramen  are patent.   IMPRESSION:   1. Large right paracentral disc protrusion at L4-5 results in  moderate right lateral recess narrowing, displacing the right L5  nerve root.  2.  Mild right foraminal narrowing at L4-5.  3.  Mild left foraminal stenosis at L5-S1.    Original Report Authenticated By: Marin Roberts, M.D.   She reports that she has never smoked. She has never used smokeless tobacco.  Recent Labs    02/16/23 1003  HGBA1C 6.2*    Objective:  VS:  HT:    WT:   BMI:     BP:   HR: bpm  TEMP: ( )  RESP:  Physical Exam Vitals and nursing note reviewed.  HENT:     Head: Normocephalic and atraumatic.     Right Ear: External ear normal.     Left Ear: External ear normal.     Nose: Nose normal.     Mouth/Throat:     Mouth: Mucous membranes are moist.  Eyes:     Extraocular Movements: Extraocular movements intact.  Cardiovascular:     Rate and Rhythm: Normal rate.     Pulses: Normal pulses.  Pulmonary:     Effort: Pulmonary effort is normal.  Abdominal:     General: Abdomen is flat. There is no distension.  Musculoskeletal:        General: Tenderness present.     Cervical back: Normal range of motion.     Comments: Patient rises from seated position to standing without difficulty. Good lumbar range of motion. No pain noted with facet loading. 5/5 strength noted with bilateral hip flexion, knee flexion/extension, ankle  dorsiflexion/plantarflexion and EHL. No clonus noted bilaterally. No pain upon palpation of greater trochanters. No pain with internal/external rotation of bilateral hips. Sensation intact bilaterally. Dysesthesias noted to left L5 dermatome. Negative slump test bilaterally. Ambulates without aid, gait steady.     Skin:    General: Skin is warm and dry.     Capillary Refill: Capillary refill takes less than 2 seconds.  Neurological:     General: No focal deficit present.     Mental Status: She is alert and oriented to person, place, and time.  Psychiatric:        Mood and Affect: Mood normal.        Behavior: Behavior normal.     Ortho Exam  Imaging: No results found.  Past Medical/Family/Surgical/Social History: Medications & Allergies reviewed per EMR, new medications  updated. Patient Active Problem List   Diagnosis Date Noted   Healthcare maintenance 10/19/2021   Vasovagal syncopes 04/20/2021   Foot pain, right 04/20/2021   Pruritus 11/04/2019   Facial rash 11/04/2019   Achilles tendinitis of both lower extremities 10/10/2018   Trigger finger, left middle finger 07/22/2018   Shoulder pain (Bilateral) 10/10/2017   Gastroesophageal reflux disease without esophagitis 10/10/2017   Asthma, exercise induced 04/13/2017   Upper respiratory infection 09/12/2016   Chronic low back pain 10/06/2015   Carpal tunnel syndrome, bilateral 02/12/2015   Menopausal syndrome (hot flashes) with mood lability, depression, sleep disturbance, and vaginal dryness  04/15/2014   Screening for diabetes mellitus 11/10/2013   Sinusitis, chronic 05/29/2013   HEMORRHOIDS 05/08/2007   Hyperlipidemia 07/13/2006   Past Medical History:  Diagnosis Date   Allergic rhinitis    Allergy    seasonal   Anxiety    Arthritis    back   Chronic bronchitis (HCC)    COPD (chronic obstructive pulmonary disease) (HCC)    Depression    Fibromyalgia    GERD (gastroesophageal reflux disease)    Hyperlipidemia     Low back pain    Family History  Problem Relation Age of Onset   Diabetes Mother        died recently in 16-Feb-2010 due to 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  She was on dialysis and died with cardiac arrest as per pt.   Heart disease Father    Hypertension Father    Coronary artery disease Other        Strong family history in first degree relatives   Colon cancer Neg Hx    Past Surgical History:  Procedure Laterality Date   ABDOMINAL HYSTERECTOMY     CARPAL TUNNEL RELEASE Right 05/05/2015   Procedure: RIGHT CARPAL TUNNEL RELEASE;  Surgeon: Tarry Kos, MD;  Location: [facility];  Service: Orthopedics;  Laterality: Right;   TRIGGER FINGER RELEASE Left 05/05/2015   Procedure: LEFT TRIGGER THUMB RELEASE;  Surgeon: Tarry Kos, MD;  Location: [facility];  Service: Orthopedics;  Laterality: Left;   WISDOM TOOTH EXTRACTION     Social History   Occupational History   Not on file  Tobacco Use   Smoking status: Never   Smokeless tobacco: Never  Substance and Sexual Activity   Alcohol use: No    Alcohol/week: 0.0 standard drinks of alcohol   Drug use: No   Sexual activity: Yes

## 2023-02-23 NOTE — Telephone Encounter (Signed)
Pt is calling to follow up to referral for hernia and mammogram. Please advise [phone]

## 2023-02-23 NOTE — Progress Notes (Unsigned)
Functional Pain Scale - descriptive words and definitions  Distressing (6)    Pain is present/unable to complete most ADLs limited by pain/sleep is difficult and active distraction is only marginal. Moderate range order  Average Pain 9  Lower back pain on both sides that radiates into the left buttock and down the left leg

## 2023-02-24 MED ORDER — MOMETASONE FURO-FORMOTEROL FUM 100-5 MCG/ACT IN AERO
2.0000 | INHALATION_SPRAY | Freq: Two times a day (BID) | RESPIRATORY_TRACT | 3 refills | Status: DC
  Filled 2023-02-24: qty 13, 30d supply, fill #0

## 2023-02-25 ENCOUNTER — Other Ambulatory Visit: Payer: Self-pay

## 2023-02-25 ENCOUNTER — Encounter: Payer: Self-pay | Admitting: Physical Medicine and Rehabilitation

## 2023-02-26 ENCOUNTER — Ambulatory Visit (INDEPENDENT_AMBULATORY_CARE_PROVIDER_SITE_OTHER): Payer: Self-pay | Admitting: *Deleted

## 2023-02-26 ENCOUNTER — Other Ambulatory Visit (INDEPENDENT_AMBULATORY_CARE_PROVIDER_SITE_OTHER): Payer: Self-pay

## 2023-02-26 ENCOUNTER — Other Ambulatory Visit: Payer: Self-pay

## 2023-02-26 ENCOUNTER — Other Ambulatory Visit (INDEPENDENT_AMBULATORY_CARE_PROVIDER_SITE_OTHER): Payer: Self-pay | Admitting: Primary Care

## 2023-02-26 DIAGNOSIS — J45990 Exercise induced bronchospasm: Secondary | ICD-10-CM

## 2023-02-26 DIAGNOSIS — Z1211 Encounter for screening for malignant neoplasm of colon: Secondary | ICD-10-CM

## 2023-02-26 DIAGNOSIS — H539 Unspecified visual disturbance: Secondary | ICD-10-CM

## 2023-02-26 DIAGNOSIS — Z1231 Encounter for screening mammogram for malignant neoplasm of breast: Secondary | ICD-10-CM

## 2023-02-26 MED ORDER — OMEPRAZOLE 40 MG PO CPDR: DELAYED_RELEASE_CAPSULE | ORAL | 0 refills | Status: DC

## 2023-02-26 MED ORDER — MOMETASONE FURO-FORMOTEROL FUM 100-5 MCG/ACT IN AERO: 2.0000 | INHALATION_SPRAY | Freq: Two times a day (BID) | RESPIRATORY_TRACT | 3 refills | Status: DC

## 2023-02-26 NOTE — Telephone Encounter (Signed)
Patient called, left VM to return the call to the office. Called in error, no need to speak to patient.

## 2023-02-26 NOTE — Telephone Encounter (Signed)
Pharmacy called saying they need clarification on the dosing of the omeprazole.  CB#  [phone]

## 2023-02-26 NOTE — Telephone Encounter (Signed)
Patient returned call . In review of chart last call from RN regarding medication refills . Patient reports she had called in to requesting all medications be sent to [facility] [place]. In review of chart last medications sent to [facility] pharmacy at [employer]. Reinforced to patient to allow pharmacy time to fill. Patient verbalized understanding     . Reason for Disposition  [1] Follow-up call to recent contact AND [2] information only call, no triage required  Answer Assessment - Initial Assessment Questions 1. REASON FOR CALL or QUESTION: "What is your reason for calling today?" or "How can I best help you?" or "What question do you have that I can help answer?"     Returned call. Requesting medications to be sent to [facility] [place]  Protocols used: Information Only Call - No Triage-A-AH

## 2023-02-26 NOTE — Telephone Encounter (Signed)
Requested Prescriptions  Pending Prescriptions Disp Refills   omeprazole (PRILOSEC) 40 MG capsule 90 capsule 0    Sig: Take as needed for acid reflux . This is not a daily medicatio     Gastroenterology: Proton Pump Inhibitors Passed - 02/26/2023 10:22 AM      Passed - Valid encounter within last 12 months    Recent Outpatient Visits           1 week ago Encounter to establish care   [place] [facility] Grayce Sessions, NP               mometasone-formoterol (DULERA) 100-5 MCG/ACT AERO 13 g 3    Sig: Inhale 2 puffs into the lungs 2 (two) times daily.     Pulmonology:  Combination Products Passed - 02/26/2023 10:22 AM      Passed - Valid encounter within last 12 months    Recent Outpatient Visits           1 week ago Encounter to establish care   [place] [facility] Grayce Sessions, NP

## 2023-02-26 NOTE — Telephone Encounter (Signed)
Requested medication (s) are due for refill today: yes  Requested medication (s) are on the active medication list: yes  Last refill:  06/07/22  Future visit scheduled: no  Notes to clinic:  Unable to refill per protocol, last refill by another provider.      Requested Prescriptions  Pending Prescriptions Disp Refills   albuterol (PROVENTIL) (2.5 MG/3ML) 0.083% nebulizer solution 75 mL 6    Sig: Take 3 mLs (2.5 mg total) by nebulization every 6 hours as needed for wheezing or shortness of breath.     Pulmonology:  Beta Agonists 2 Passed - 02/23/2023  5:34 PM      Passed - Last BP in normal range    BP Readings from Last 1 Encounters:  02/16/23 133/89         Passed - Last Heart Rate in normal range    Pulse Readings from Last 1 Encounters:  02/16/23 94         Passed - Valid encounter within last 12 months    Recent Outpatient Visits           1 week ago Encounter to establish care   [place] [facility] Grayce Sessions, NP               albuterol (VENTOLIN HFA) 108 (90 Base) MCG/ACT inhaler 6.7 g 2    Sig: Inhale 2 puffs into the lungs 4 (four) times daily as needed for wheezing or shortness of breath.     Pulmonology:  Beta Agonists 2 Passed - 02/23/2023  5:34 PM      Passed - Last BP in normal range    BP Readings from Last 1 Encounters:  02/16/23 133/89         Passed - Last Heart Rate in normal range    Pulse Readings from Last 1 Encounters:  02/16/23 94         Passed - Valid encounter within last 12 months    Recent Outpatient Visits           1 week ago Encounter to establish care   [place] [facility] Grayce Sessions, NP

## 2023-02-26 NOTE — Telephone Encounter (Signed)
Medication Refill - Medication: omeprazole (PRILOSEC) 40 MG capsule [[id number]]   albuterol (PROVENTIL) (2.5 MG/3ML) 0.083% nebulizer   Rx #: [id number]  albuterol (VENTOLIN HFA) 108   Rx #: [id number]  mometasone-formoterol   Rx #: [id number]  mometasone-formoterol   Pt is calling medications were sent the wrong pharmacy  Has the patient contacted their pharmacy? Yes.   (Agent: If no, request that the patient contact the pharmacy for the refill. If patient does not wish to contact the pharmacy document the reason why and proceed with request.) (Agent: If yes, when and what did the pharmacy advise?)  Preferred Pharmacy (with phone number or   [facility] - [place] ([place]), [place] - [address] Phone: [phone]  Fax: [phone]     Care Team and Communications   No referring provider   PCPs Type     Grayce Sessions, NP General     No other patient care team members   No visit treatment team   Recipients of Past 1 Communications Show More      Office Visit - 02/23/2023         Grayce Sessions, NP Ready to Send In Basket       Recipients of Automatic ADT Notifications for Most Recent Admission Show All AdmissionsNo notifications found.  Medications    albuterol (PROVENTIL) (2.5 MG/3ML) 0.083% nebulizer solution  albuterol (VENTOLIN HFA) 108 (90 Base) MCG/ACT inhaler baclofen (LIORESAL) 10 MG tablet  mometasone-formoterol (DULERA) 100-5 MCG/ACT AERO  omeprazole (PRILOSEC) 40 MG capsule  rosuvastatin (CRESTOR) 5 MG tablet   Mark as Reviewed  Reviewed by Juanda Chance, NP on 02/25/2023  Preferred Pharmacies   [facility] - [place] ([place]), [place] - [address] Phone: [phone]  Fax: [phone]       Has the patient been seen for an appointment in the last year OR does the patient have an upcoming appointment? Yes.    Agent: Please be advised that RX refills may take up to 3 business days. We  ask that you follow-up with your pharmacy.

## 2023-02-26 NOTE — Telephone Encounter (Signed)
Marcelino Duster I don't see anything in your note about a hernia... I have already placed order for mammogram

## 2023-02-26 NOTE — Telephone Encounter (Signed)
Returned call to pharmacy and spoke to pharmacist Yaw and confirmed directions take 1 tablet prn per [facility]

## 2023-02-28 ENCOUNTER — Ambulatory Visit
Admission: RE | Admit: 2023-02-28 | Discharge: 2023-02-28 | Disposition: A | Discharge status: Home / Self Care | Payer: Medicaid Other | Source: Ambulatory Visit | Attending: Primary Care | Admitting: Primary Care

## 2023-02-28 DIAGNOSIS — Z1231 Encounter for screening mammogram for malignant neoplasm of breast: Secondary | ICD-10-CM | POA: Diagnosis not present

## 2023-02-28 NOTE — Telephone Encounter (Signed)
Please place referral

## 2023-03-01 ENCOUNTER — Telehealth: Payer: Self-pay | Admitting: Physical Medicine and Rehabilitation

## 2023-03-01 NOTE — Telephone Encounter (Signed)
Peer to peer for lumbar MRI imaging this afternoon was denied. Requires 6 weeks of conservative treatment. I called and informed patient, she wishes to continue with physician directed home exercise regimen and baclofen. She will call us back and let us know how she is doing.

## 2023-03-02 ENCOUNTER — Other Ambulatory Visit (INDEPENDENT_AMBULATORY_CARE_PROVIDER_SITE_OTHER): Payer: Self-pay

## 2023-03-02 ENCOUNTER — Telehealth (INDEPENDENT_AMBULATORY_CARE_PROVIDER_SITE_OTHER): Payer: Self-pay | Admitting: Primary Care

## 2023-03-02 ENCOUNTER — Other Ambulatory Visit (INDEPENDENT_AMBULATORY_CARE_PROVIDER_SITE_OTHER): Payer: Self-pay | Admitting: Primary Care

## 2023-03-02 DIAGNOSIS — Z1211 Encounter for screening for malignant neoplasm of colon: Secondary | ICD-10-CM

## 2023-03-02 DIAGNOSIS — K4091 Unilateral inguinal hernia, without obstruction or gangrene, recurrent: Secondary | ICD-10-CM

## 2023-03-02 NOTE — Telephone Encounter (Signed)
Returned pt call and made aware that provider has placed referral to general surgeon and she will receive a call to schedule and an order for the cologuard has been placed. Pt doesn't have any questions or concerns

## 2023-03-02 NOTE — Telephone Encounter (Signed)
Returned pt call and made aware that provider has placed referral to general surgeon and she will receive a call to schedule and an order for the cologuard has been placed. Pt doesn't have any questions or concerns    

## 2023-03-02 NOTE — Telephone Encounter (Signed)
Pt is calling in because she says she requested a kit to check her colon at home and hasn't heard anything back. Pt says it has been at least a week. Please follow up with pt.

## 2023-03-03 ENCOUNTER — Other Ambulatory Visit: Payer: Medicaid Other

## 2023-03-05 ENCOUNTER — Telehealth: Payer: Self-pay | Admitting: Primary Care

## 2023-03-05 NOTE — Telephone Encounter (Signed)
Copied from CRM [id number]. Topic: Referral - Status >> Mar 05, 2023  9:43 AM Franchot Heidelberg wrote: Reason for CRM: [facility] is out of network for the patient's insurance  Best contact: [phone]  Cat from [facility]

## 2023-03-05 NOTE — Telephone Encounter (Signed)
Will forward to Nora  

## 2023-03-06 ENCOUNTER — Telehealth: Payer: Self-pay | Admitting: *Deleted

## 2023-03-06 MED ORDER — BACLOFEN 10 MG PO TABS: 10.0000 mg | ORAL_TABLET | Freq: Three times a day (TID) | ORAL | 0 refills | Status: DC

## 2023-03-06 NOTE — Telephone Encounter (Signed)
Pt called states she is needing a refill on the Baclofen- states she is doing bettter. Please advise.

## 2023-03-07 NOTE — Telephone Encounter (Signed)
Noted  

## 2023-03-16 LAB — COLOGUARD

## 2023-03-22 DIAGNOSIS — Z1211 Encounter for screening for malignant neoplasm of colon: Secondary | ICD-10-CM | POA: Diagnosis not present

## 2023-03-27 ENCOUNTER — Encounter: Payer: Self-pay | Admitting: Surgery

## 2023-03-27 ENCOUNTER — Encounter: Payer: Self-pay | Admitting: Obstetrics & Gynecology

## 2023-03-27 ENCOUNTER — Other Ambulatory Visit: Payer: Self-pay | Admitting: Surgery

## 2023-03-27 DIAGNOSIS — K4091 Unilateral inguinal hernia, without obstruction or gangrene, recurrent: Secondary | ICD-10-CM

## 2023-03-27 DIAGNOSIS — K219 Gastro-esophageal reflux disease without esophagitis: Secondary | ICD-10-CM | POA: Diagnosis not present

## 2023-03-27 DIAGNOSIS — K449 Diaphragmatic hernia without obstruction or gangrene: Secondary | ICD-10-CM | POA: Diagnosis not present

## 2023-03-27 DIAGNOSIS — E785 Hyperlipidemia, unspecified: Secondary | ICD-10-CM | POA: Diagnosis not present

## 2023-03-30 LAB — COLOGUARD: COLOGUARD: NEGATIVE

## 2023-04-20 ENCOUNTER — Ambulatory Visit
Admission: RE | Admit: 2023-04-20 | Discharge: 2023-04-20 | Disposition: A | Discharge status: Home / Self Care | Payer: Medicaid Other | Source: Ambulatory Visit | Attending: Surgery | Admitting: Surgery

## 2023-04-20 DIAGNOSIS — K4091 Unilateral inguinal hernia, without obstruction or gangrene, recurrent: Secondary | ICD-10-CM

## 2023-04-20 MED ORDER — IOPAMIDOL (ISOVUE-300) INJECTION 61%
100.0000 mL | Freq: Once | INTRAVENOUS | Status: AC | PRN
  Administered 2023-04-20: 100 mL via INTRAVENOUS

## 2023-04-23 ENCOUNTER — Telehealth: Payer: Self-pay | Admitting: Physical Medicine and Rehabilitation

## 2023-04-23 NOTE — Telephone Encounter (Signed)
Patient called and said megan told her to call back if nothing is working after 6 wks  of therapy. CB#[phone]

## 2023-05-09 ENCOUNTER — Other Ambulatory Visit: Payer: Self-pay | Admitting: Physical Medicine and Rehabilitation

## 2023-05-09 ENCOUNTER — Other Ambulatory Visit (INDEPENDENT_AMBULATORY_CARE_PROVIDER_SITE_OTHER): Payer: Self-pay | Admitting: Primary Care

## 2023-05-09 MED ORDER — BACLOFEN 10 MG PO TABS: 10.0000 mg | ORAL_TABLET | Freq: Three times a day (TID) | ORAL | 0 refills | Status: DC

## 2023-05-09 MED ORDER — OMEPRAZOLE 40 MG PO CPDR: DELAYED_RELEASE_CAPSULE | ORAL | 0 refills | Status: DC

## 2023-05-17 ENCOUNTER — Other Ambulatory Visit: Payer: Self-pay | Admitting: Physical Medicine and Rehabilitation

## 2023-05-19 ENCOUNTER — Ambulatory Visit
Admission: RE | Admit: 2023-05-19 | Discharge: 2023-05-19 | Disposition: A | Discharge status: Home / Self Care | Payer: Medicaid Other | Source: Ambulatory Visit | Attending: Physical Medicine and Rehabilitation | Admitting: Physical Medicine and Rehabilitation

## 2023-05-19 DIAGNOSIS — M5116 Intervertebral disc disorders with radiculopathy, lumbar region: Secondary | ICD-10-CM

## 2023-05-19 DIAGNOSIS — M5416 Radiculopathy, lumbar region: Secondary | ICD-10-CM

## 2023-05-19 DIAGNOSIS — M47816 Spondylosis without myelopathy or radiculopathy, lumbar region: Secondary | ICD-10-CM | POA: Diagnosis not present

## 2023-05-19 DIAGNOSIS — G8929 Other chronic pain: Secondary | ICD-10-CM

## 2023-05-19 DIAGNOSIS — M48061 Spinal stenosis, lumbar region without neurogenic claudication: Secondary | ICD-10-CM | POA: Diagnosis not present

## 2023-05-27 ENCOUNTER — Other Ambulatory Visit (INDEPENDENT_AMBULATORY_CARE_PROVIDER_SITE_OTHER): Payer: Self-pay | Admitting: Primary Care

## 2023-05-27 DIAGNOSIS — E785 Hyperlipidemia, unspecified: Secondary | ICD-10-CM

## 2023-05-29 NOTE — Telephone Encounter (Signed)
Requested medication (s) are due for refill today:   Yes  Requested medication (s) are on the active medication list:   Yes  Future visit scheduled:   No   Established care 3 mo. ago   Last ordered: 02/17/2023 #90, 1 refill  Returned because a very high alert from pharmacy regarding statins.   See phar. Note.   Requested Prescriptions  Pending Prescriptions Disp Refills   rosuvastatin (CRESTOR) 5 MG tablet [Pharmacy Med Name: Rosuvastatin Calcium 5 MG Oral Tablet] 90 tablet 0    Sig: Take 2 tablets by mouth once daily     Cardiovascular:  Antilipid - Statins 2 Failed - 05/27/2023  8:16 AM      Failed - Lipid Panel in normal range within the last 12 months    Cholesterol, Total  Date Value Ref Range Status  02/16/2023 180 100 - 199 mg/dL Final   LDL Chol Calc (NIH)  Date Value Ref Range Status  02/16/2023 100 (H) 0 - 99 mg/dL Final   HDL  Date Value Ref Range Status  02/16/2023 61 >39 mg/dL Final   Triglycerides  Date Value Ref Range Status  02/16/2023 106 0 - 149 mg/dL Final         Passed - Cr in normal range and within 360 days    Creat  Date Value Ref Range Status  02/11/2015 0.77 0.50 - 1.10 mg/dL Final   Creatinine, Ser  Date Value Ref Range Status  02/16/2023 0.79 0.57 - 1.00 mg/dL Final         Passed - Patient is not pregnant      Passed - Valid encounter within last 12 months    Recent Outpatient Visits           3 months ago Encounter to establish care   [place] [facility] Grayce Sessions, NP

## 2023-05-29 NOTE — Telephone Encounter (Signed)
Will forward to provider

## 2023-06-01 ENCOUNTER — Telehealth: Payer: Self-pay | Admitting: Physical Medicine and Rehabilitation

## 2023-06-01 NOTE — Telephone Encounter (Signed)
Patient called and said was told after the MRI that she would be called for an appointment for review. She had the MRI done on the 7th of September. CB#[phone]

## 2023-06-01 NOTE — Telephone Encounter (Signed)
Report not ready, called for report

## 2023-06-02 ENCOUNTER — Encounter (INDEPENDENT_AMBULATORY_CARE_PROVIDER_SITE_OTHER): Payer: Self-pay | Admitting: Primary Care

## 2023-06-04 ENCOUNTER — Other Ambulatory Visit: Payer: Self-pay | Admitting: Physical Medicine and Rehabilitation

## 2023-06-04 DIAGNOSIS — M5416 Radiculopathy, lumbar region: Secondary | ICD-10-CM

## 2023-06-04 NOTE — Progress Notes (Signed)
Spoke with patient this morning regarding recent lumbar MRI imaging. There is moderate to severe foraminal narrowing at L5-S1 that has progressed from imaging in 2013. I placed order for bilateral L5 transforaminal as she does have pain to bilateral lower back.

## 2023-06-06 ENCOUNTER — Other Ambulatory Visit: Payer: Self-pay | Admitting: Physical Medicine and Rehabilitation

## 2023-06-11 ENCOUNTER — Telehealth: Payer: Self-pay | Admitting: Physical Medicine and Rehabilitation

## 2023-06-11 NOTE — Telephone Encounter (Signed)
Spoke with patient and rescheduled injection for 06/21/23. Patient aware driver needed

## 2023-06-11 NOTE — Telephone Encounter (Signed)
Pt called to reschedule appt with Dr Alvester Morin. Please call pt at [phone].

## 2023-06-15 ENCOUNTER — Encounter (INDEPENDENT_AMBULATORY_CARE_PROVIDER_SITE_OTHER): Payer: Self-pay

## 2023-06-15 ENCOUNTER — Other Ambulatory Visit (INDEPENDENT_AMBULATORY_CARE_PROVIDER_SITE_OTHER): Payer: Medicaid Other

## 2023-06-15 ENCOUNTER — Other Ambulatory Visit (INDEPENDENT_AMBULATORY_CARE_PROVIDER_SITE_OTHER): Payer: Self-pay

## 2023-06-15 MED ORDER — ROSUVASTATIN CALCIUM 10 MG PO TABS: 10.0000 mg | ORAL_TABLET | Freq: Every day | ORAL | 0 refills | Status: DC

## 2023-06-18 ENCOUNTER — Encounter: Payer: Medicaid Other | Admitting: Physical Medicine and Rehabilitation

## 2023-06-21 ENCOUNTER — Ambulatory Visit: Payer: Medicaid Other | Admitting: Physical Medicine and Rehabilitation

## 2023-06-21 ENCOUNTER — Other Ambulatory Visit: Payer: Self-pay

## 2023-06-21 VITALS — BP 136/89 | HR 88

## 2023-06-21 DIAGNOSIS — M5416 Radiculopathy, lumbar region: Secondary | ICD-10-CM

## 2023-06-21 MED ORDER — METHYLPREDNISOLONE ACETATE 40 MG/ML IJ SUSP
40.0000 mg | Freq: Once | INTRAMUSCULAR | Status: DC
Start: 2023-06-21 — End: 2023-07-01

## 2023-06-21 NOTE — Progress Notes (Signed)
Functional Pain Scale - descriptive words and definitions  Unmanageable (7)  Pain interferes with normal ADL's/nothing seems to help/sleep is very difficult/active distractions are very difficult to concentrate on. Severe range order  Average Pain 7   +Driver, -BT, -Dye Allergies.  Lower back pain on both sides that radiates in the buttocks and both legs

## 2023-06-21 NOTE — Patient Instructions (Signed)

## 2023-06-25 ENCOUNTER — Other Ambulatory Visit (INDEPENDENT_AMBULATORY_CARE_PROVIDER_SITE_OTHER): Payer: Self-pay | Admitting: Primary Care

## 2023-06-25 ENCOUNTER — Telehealth: Payer: Self-pay | Admitting: Primary Care

## 2023-06-25 NOTE — Telephone Encounter (Signed)
Returned pt call and made aware that she will need to reach out to ortho in regards to getting medication refill. Pt states she understands and doesn't have any questions or concerns

## 2023-06-25 NOTE — Telephone Encounter (Signed)
Medication Refill - Medication: baclofen (LIORESAL) 10 MG tablet   Has the patient contacted their pharmacy? No. Pt is new w/ Marcelino Duster and Marcelino Duster has has not filled.  However, pt states Marcelino Duster did fill but it is not correct.  Preferred Pharmacy (with phone number or street name): [facility] - [place] ([place] - [address]  Has the patient been seen for an appointment in the last year OR does the patient have an upcoming appointment? Yes.    Agent: Please be advised that RX refills may take up to 3 business days. We ask that you follow-up with your pharmacy.

## 2023-06-25 NOTE — Telephone Encounter (Signed)
Copied from CRM [id number]. Topic: General - Inquiry >> Jun 25, 2023 10:21 AM Payton Doughty wrote: Reason for CRM: pt would like Jay'a to give her a call.  Pt states Marcelino Duster was supposed to have sent in Baclofen, 3 X a day.  But it wasn't done. Refill request sent to [facility] this am as well.

## 2023-06-26 MED ORDER — BACLOFEN 10 MG PO TABS: 10.0000 mg | ORAL_TABLET | Freq: Three times a day (TID) | ORAL | 0 refills | Status: DC

## 2023-06-26 NOTE — Telephone Encounter (Signed)
Requested Prescriptions  Pending Prescriptions Disp Refills   baclofen (LIORESAL) 10 MG tablet 30 tablet 0    Sig: Take 1 tablet (10 mg total) by mouth 3 (three) times daily.     Analgesics:  Muscle Relaxants - baclofen Passed - 06/25/2023 10:54 AM      Passed - Cr in normal range and within 180 days    Creat  Date Value Ref Range Status  02/11/2015 0.77 0.50 - 1.10 mg/dL Final   Creatinine, Ser  Date Value Ref Range Status  02/16/2023 0.79 0.57 - 1.00 mg/dL Final         Passed - eGFR is 30 or above and within 180 days    GFR, Est African American  Date Value Ref Range Status  02/11/2015 >89 mL/min Final   GFR calc Af Amer  Date Value Ref Range Status  07/19/2017 86 >59 mL/min/1.73 Final   GFR, Est Non African American  Date Value Ref Range Status  02/11/2015 >89 mL/min Final    Comment:      The estimated GFR is a calculation valid for adults (>=16 years old) that uses the CKD-EPI algorithm to adjust for age and sex. It is   not to be used for children, pregnant women, hospitalized patients,    patients on dialysis, or with rapidly changing kidney function. According to the NKDEP, eGFR >89 is normal, 60-89 shows mild impairment, 30-59 shows moderate impairment, 15-29 shows severe impairment and <15 is ESRD.      GFR calc non Af Amer  Date Value Ref Range Status  07/19/2017 74 >59 mL/min/1.73 Final   eGFR  Date Value Ref Range Status  02/16/2023 86 >59 mL/min/1.73 Final         Passed - Valid encounter within last 6 months    Recent Outpatient Visits           4 months ago Encounter to establish care   [place] [facility] Grayce Sessions, NP

## 2023-07-01 ENCOUNTER — Ambulatory Visit (HOSPITAL_COMMUNITY)
Admission: EM | Admit: 2023-07-01 | Discharge: 2023-07-01 | Disposition: A | Discharge status: Home / Self Care | Payer: Medicaid Other | Attending: Family Medicine | Admitting: Family Medicine

## 2023-07-01 ENCOUNTER — Encounter (HOSPITAL_COMMUNITY): Payer: Self-pay | Admitting: Emergency Medicine

## 2023-07-01 DIAGNOSIS — H00039 Abscess of eyelid unspecified eye, unspecified eyelid: Secondary | ICD-10-CM | POA: Diagnosis not present

## 2023-07-01 DIAGNOSIS — H109 Unspecified conjunctivitis: Secondary | ICD-10-CM

## 2023-07-01 MED ORDER — CEPHALEXIN 250 MG PO CAPS: 250.0000 mg | ORAL_CAPSULE | Freq: Three times a day (TID) | ORAL | 0 refills | Status: AC

## 2023-07-01 MED ORDER — GENTAMICIN SULFATE 0.3 % OP SOLN: 2.0000 [drp] | Freq: Three times a day (TID) | OPHTHALMIC | 0 refills | Status: AC

## 2023-07-01 NOTE — ED Triage Notes (Addendum)
Pt c/o of eye pain in both eyes with drainage. She has a bump on top of right eyelid and bump under left eye. This started two weeks ago after she got some ice hot spray on her face and eyes

## 2023-07-01 NOTE — Discharge Instructions (Signed)
Take cephalexin 250 mg--1 capsule 3 times daily for 7 days  Put gentamicin eyedrops in the both eye(s) 3 times daily for 5 days.  Please follow-up with your primary care

## 2023-07-01 NOTE — ED Provider Notes (Signed)
[facility]    CSN: [id number] Arrival date & time: 07/01/23  1001      History   Chief Complaint Chief Complaint  Patient presents with   Eye Pain    HPI Briana Parker is a 57 y.o. female.    Eye Pain  Here for bilateral eye discomfort and eyelid swelling.  Symptoms began about 2 weeks ago after she had accidentally sprayed some IcyHot spray into her face.  The left eye was itching some but now that has improved.  She has had a little bit of white dried discharge in the inner canthi bilaterally.  Also she has a little discomfort and swelling in her left lower eyelid and also more swelling in her right upper eyelid.  The right upper eyelid is not uncomfortable much.  No fever or chills      Past Medical History:  Diagnosis Date   Allergic rhinitis    Allergy    seasonal   Anxiety    Arthritis    back   Chronic bronchitis (HCC)    COPD (chronic obstructive pulmonary disease) (HCC)    Depression    Fibromyalgia    GERD (gastroesophageal reflux disease)    Hyperlipidemia    Low back pain     Patient Active Problem List   Diagnosis Date Noted   Healthcare maintenance 10/19/2021   Vasovagal syncopes 04/20/2021   Foot pain, right 04/20/2021   Pruritus 11/04/2019   Facial rash 11/04/2019   Achilles tendinitis of both lower extremities 10/10/2018   Trigger finger, left middle finger 07/22/2018   Shoulder pain (Bilateral) 10/10/2017   Gastroesophageal reflux disease without esophagitis 10/10/2017   Asthma, exercise induced 04/13/2017   Upper respiratory infection 09/12/2016   Chronic low back pain 10/06/2015   Carpal tunnel syndrome, bilateral 02/12/2015   Menopausal syndrome (hot flashes) with mood lability, depression, sleep disturbance, and vaginal dryness  04/15/2014   Screening for diabetes mellitus 11/10/2013   Sinusitis, chronic 05/29/2013   HEMORRHOIDS 05/08/2007   Hyperlipidemia 07/13/2006    Past Surgical History:  Procedure  Laterality Date   ABDOMINAL HYSTERECTOMY     CARPAL TUNNEL RELEASE Right 05/05/2015   Procedure: RIGHT CARPAL TUNNEL RELEASE;  Surgeon: Tarry Kos, MD;  Location: [facility];  Service: Orthopedics;  Laterality: Right;   TRIGGER FINGER RELEASE Left 05/05/2015   Procedure: LEFT TRIGGER THUMB RELEASE;  Surgeon: Tarry Kos, MD;  Location: [facility];  Service: Orthopedics;  Laterality: Left;   WISDOM TOOTH EXTRACTION      OB History   No obstetric history on file.      Home Medications    Prior to Admission medications   Medication Sig Start Date End Date Taking? Authorizing Provider  cephALEXin (KEFLEX) 250 MG capsule Take 1 capsule (250 mg total) by mouth 3 (three) times daily for 7 days. 07/01/23 07/08/23 Yes Jasson Siegmann, Janace Aris, MD  gentamicin (GARAMYCIN) 0.3 % ophthalmic solution Place 2 drops into both eyes 3 (three) times daily for 5 days. 07/01/23 07/06/23 Yes Zenia Resides, MD  albuterol (PROVENTIL) (2.5 MG/3ML) 0.083% nebulizer solution Take 3 mLs (2.5 mg total) by nebulization every 6 hours as needed for wheezing or shortness of breath. 06/07/22   Marrianne Mood, MD  albuterol (VENTOLIN HFA) 108 (90 Base) MCG/ACT inhaler Inhale 2 puffs into the lungs 4 (four) times daily as needed for wheezing or shortness of breath. 06/07/22   Marrianne Mood, MD  baclofen (LIORESAL) 10 MG tablet  Take 1 tablet (10 mg total) by mouth 3 (three) times daily. 06/26/23   Grayce Sessions, NP  cetirizine (ZYRTEC) 10 MG tablet Take by mouth. 07/15/22   [provider]  fluticasone (FLONASE) 50 MCG/ACT nasal spray 1 spray Once Daily. 07/15/22   [provider]  mometasone-formoterol (DULERA) 100-5 MCG/ACT AERO Inhale 2 puffs into the lungs 2 (two) times daily. 02/26/23   Grayce Sessions, NP  omeprazole (PRILOSEC) 40 MG capsule Take as needed for acid reflux . This is not a daily medicatio 05/09/23   Grayce Sessions, NP  rosuvastatin (CRESTOR)  10 MG tablet Take 1 tablet (10 mg total) by mouth daily. 06/15/23   Grayce Sessions, NP    Family History Family History  Problem Relation Age of Onset   Diabetes Mother        died recently in 02-08-10 due to 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  She was on dialysis and died with cardiac arrest as per pt.   Heart disease Father    Hypertension Father    Coronary artery disease Other        Strong family history in first degree relatives   Colon cancer Neg Hx     Social History Social History   Tobacco Use   Smoking status: Never   Smokeless tobacco: Never  Substance Use Topics   Alcohol use: No    Alcohol/week: 0.0 standard drinks of alcohol   Drug use: No     Allergies   Augmentin [amoxicillin-pot clavulanate], Codeine, Hydrocodone, Ibuprofen, Nortriptyline, Pravastatin sodium, and Simvastatin   Review of Systems Review of Systems  Eyes:  Positive for pain.     Physical Exam Triage Vital Signs ED Triage Vitals  Encounter Vitals Group     BP 07/01/23 1017 117/78     Systolic BP Percentile --      Diastolic BP Percentile --      Pulse Rate 07/01/23 1017 85     Resp 07/01/23 1017 16     Temp 07/01/23 1017 98.9 F (37.2 C)     Temp Source 07/01/23 1017 Oral     SpO2 07/01/23 1017 95 %     Weight --      Height --      Head Circumference --      Peak Flow --      Pain Score 07/01/23 1015 7     Pain Loc --      Pain Education --      Exclude from Growth Chart --    No data found.  Updated Vital Signs BP 117/78 (BP Location: Left Arm)   Pulse 85   Temp 98.9 F (37.2 C) (Oral)   Resp 16   SpO2 95%   Visual Acuity Right Eye Distance:   Left Eye Distance:   Bilateral Distance:    Right Eye Near:   Left Eye Near:    Bilateral Near:     Physical Exam Vitals reviewed.  Constitutional:      General: She is not in acute distress.    Appearance: She is not ill-appearing, toxic-appearing or diaphoretic.  HENT:     Mouth/Throat:     Mouth: Mucous membranes are  moist.  Eyes:     Extraocular Movements: Extraocular movements intact.     Conjunctiva/sclera: Conjunctivae normal.     Pupils: Pupils are equal, round, and reactive to light.     Comments: There is a tiny erythematous swelling about 2 mm in  diameter inside the left lower lid that she shows me.  The right upper eyelid has an area of swelling about 1 cm in diameter that is not fluctuant but is maybe a little tender and a little erythematous.  Musculoskeletal:     Cervical back: Neck supple.  Lymphadenopathy:     Cervical: No cervical adenopathy.  Skin:    Coloration: Skin is not pale.  Neurological:     Mental Status: She is oriented to person, place, and time.  Psychiatric:        Behavior: Behavior normal.      UC Treatments / Results  Labs (all labs ordered are listed, but only abnormal results are displayed) Labs Reviewed - No data to display  EKG   Radiology No results found.  Procedures Procedures (including critical care time)  Medications Ordered in UC Medications - No data to display  Initial Impression / Assessment and Plan / UC Course  I have reviewed the triage vital signs and the nursing notes.  Pertinent labs & imaging results that were available during my care of the patient were reviewed by me and considered in my medical decision making (see chart for details).      Gentamicin ophthalmic solution is sent in since she does have some discharge from the eyes.  Keflex is sent in for possible cellulitis in the eyelid.  She will follow-up with her primary care about this issue. Final Clinical Impressions(s) / UC Diagnoses   Final diagnoses:  Cellulitis of eyelid  Conjunctivitis of both eyes, unspecified conjunctivitis type     Discharge Instructions      Take cephalexin 250 mg--1 capsule 3 times daily for 7 days  Put gentamicin eyedrops in the both eye(s) 3 times daily for 5 days.  Please follow-up with your primary care       ED  Prescriptions     Medication Sig Dispense Auth. Provider   cephALEXin (KEFLEX) 250 MG capsule Take 1 capsule (250 mg total) by mouth 3 (three) times daily for 7 days. 21 capsule Zenia Resides, MD   gentamicin (GARAMYCIN) 0.3 % ophthalmic solution Place 2 drops into both eyes 3 (three) times daily for 5 days. 5 mL Zenia Resides, MD      PDMP not reviewed this encounter.   Zenia Resides, MD 07/01/23 [phone]

## 2023-07-02 NOTE — Progress Notes (Signed)
Briana Parker - 57 y.o. female MRN [id number]  Date of birth: Nov 29, 1965  Office Visit Note: Visit Date: 06/21/2023 PCP: Grayce Sessions, NP Referred by: Grayce Sessions, NP  Subjective: Chief Complaint  Patient presents with   Lower Back - Pain   HPI:  Briana Parker is a 57 y.o. female who comes in today at the request of Ellin Goodie, FNP for planned Bilateral L5-S1 Lumbar Transforaminal epidural steroid injection with fluoroscopic guidance.  The patient has failed conservative care including home exercise, medications, time and activity modification.  This injection will be diagnostic and hopefully therapeutic.  Please see requesting physician notes for further details and justification.   ROS Otherwise per HPI.  Assessment & Plan: Visit Diagnoses:    ICD-10-CM   1. Lumbar radiculopathy  M54.16 XR C-ARM NO REPORT    Epidural Steroid injection    DISCONTINUED: methylPREDNISolone acetate (DEPO-MEDROL) injection 40 mg      Plan: No additional findings.   Meds & Orders:  Meds ordered this encounter  Medications   DISCONTD: methylPREDNISolone acetate (DEPO-MEDROL) injection 40 mg    Orders Placed This Encounter  Procedures   XR C-ARM NO REPORT   Epidural Steroid injection    Follow-up: Return for visit to requesting provider as needed.   Procedures: No procedures performed  Lumbosacral Transforaminal Epidural Steroid Injection - Sub-Pedicular Approach with Fluoroscopic Guidance  Patient: Briana Parker      Date of Birth: 03-18-1966 MRN: [id number] PCP: Grayce Sessions, NP      Visit Date: 06/21/2023   Universal Protocol:    Date/Time: 06/21/2023  Consent Given By: the patient  Position: PRONE  Additional Comments: Vital signs were monitored before and after the procedure. Patient was prepped and draped in the usual sterile fashion. The correct patient, procedure, and site was verified.   Injection Procedure Details:   Procedure  diagnoses: Lumbar radiculopathy [M54.16]    Meds Administered:  Meds ordered this encounter  Medications   DISCONTD: methylPREDNISolone acetate (DEPO-MEDROL) injection 40 mg    Laterality: Bilateral  Location/Site: L5  Needle:5.0 in., 22 ga.  Short bevel or Quincke spinal needle  Needle Placement: Transforaminal  Findings:    -Comments: Excellent flow of contrast along the nerve, nerve root and into the epidural space.  Procedure Details: After squaring off the end-plates to get a true AP view, the C-arm was positioned so that an oblique view of the foramen as noted above was visualized. The target area is just inferior to the "nose of the scotty dog" or sub pedicular. The soft tissues overlying this structure were infiltrated with 2-3 ml. of 1% Lidocaine without Epinephrine.  The spinal needle was inserted toward the target using a "trajectory" view along the fluoroscope beam.  Under AP and lateral visualization, the needle was advanced so it did not puncture dura and was located close the 6 O'Clock position of the pedical in AP tracterory. Biplanar projections were used to confirm position. Aspiration was confirmed to be negative for CSF and/or blood. A 1-2 ml. volume of Isovue-250 was injected and flow of contrast was noted at each level. Radiographs were obtained for documentation purposes.   After attaining the desired flow of contrast documented above, a 0.5 to 1.0 ml test dose of 0.25% Marcaine was injected into each respective transforaminal space.  The patient was observed for 90 seconds post injection.  After no sensory deficits were reported, and normal lower extremity motor function was noted,   the above injectate  was administered so that equal amounts of the injectate were placed at each foramen (level) into the transforaminal epidural space.   Additional Comments:  No complications occurred Dressing: 2 x 2 sterile gauze and Band-Aid    Post-procedure details: Patient  was observed during the procedure. Post-procedure instructions were reviewed.  Patient left the clinic in stable condition.    Clinical History: CLINICAL DATA:  Low back pain with radiculopathy   EXAM: MRI LUMBAR SPINE WITHOUT CONTRAST   TECHNIQUE: Multiplanar, multisequence MR imaging of the lumbar spine was performed. No intravenous contrast was administered.   COMPARISON:  MRI 07/25/2012   FINDINGS: Segmentation:  Standard.   Alignment:  Physiologic.   Vertebrae:  No fracture, evidence of discitis, or bone lesion.   Conus medullaris and cauda equina: Conus extends to the T12-L1 level. Conus and cauda equina appear normal.   Paraspinal and other soft tissues: Negative.   Disc levels:   T12-L1 and L1-L2: Unremarkable.   L2-L3: Minimal annular disc bulge.  No foraminal or canal stenosis.   L3-L4: Annular disc bulge without foraminal or canal stenosis.   L4-L5: Asymmetric disc height loss on the right. Broad-based disc bulge, eccentric to the right. Mild bilateral facet hypertrophy. Mild right subarticular recess stenosis without canal stenosis. Mild right foraminal stenosis. The previously seen large right paracentral disc protrusion at this level has largely receded.   L5-S1: Disc bulge and endplate spurring, eccentric to the left. Mild facet hypertrophy. No canal stenosis. Moderate-severe left foraminal stenosis. Borderline-mild right foraminal stenosis. Findings have progressed from prior.   IMPRESSION: 1. Multilevel lumbar spondylosis, most pronounced at L5-S1 where there is moderate-severe left foraminal stenosis. Findings have progressed from prior. 2. Mild right foraminal stenosis at L4-L5. The previously seen large right paracentral disc protrusion at this level has largely receded. 3. No canal stenosis at any level.     Electronically Signed   By: Duanne Guess D.O.   On: 06/01/2023 10:42     Objective:  VS:  HT:    WT:   BMI:      BP:136/89  HR:88bpm  TEMP: ( )  RESP:  Physical Exam Vitals and nursing note reviewed.  Constitutional:      General: She is not in acute distress.    Appearance: Normal appearance. She is not ill-appearing.  HENT:     Head: Normocephalic and atraumatic.     Right Ear: External ear normal.     Left Ear: External ear normal.  Eyes:     Extraocular Movements: Extraocular movements intact.  Cardiovascular:     Rate and Rhythm: Normal rate.     Pulses: Normal pulses.  Pulmonary:     Effort: Pulmonary effort is normal. No respiratory distress.  Abdominal:     General: There is no distension.     Palpations: Abdomen is soft.  Musculoskeletal:        General: Tenderness present.     Cervical back: Neck supple.     Right lower leg: No edema.     Left lower leg: No edema.     Comments: Patient has good distal strength with no pain over the greater trochanters.  No clonus or focal weakness.  Skin:    Findings: No erythema, lesion or rash.  Neurological:     General: No focal deficit present.     Mental Status: She is alert and oriented to person, place, and time.     Sensory: No sensory deficit.     Motor:  No weakness or abnormal muscle tone.     Coordination: Coordination normal.  Psychiatric:        Mood and Affect: Mood normal.        Behavior: Behavior normal.      Imaging: No results found.

## 2023-07-02 NOTE — Procedures (Signed)
Lumbosacral Transforaminal Epidural Steroid Injection - Sub-Pedicular Approach with Fluoroscopic Guidance  Patient: Briana Parker      Date of Birth: [birth year] MRN: [id number] PCP: Grayce Sessions, NP      Visit Date: 06/21/2023   Universal Protocol:    Date/Time: 06/21/2023  Consent Given By: the patient  Position: PRONE  Additional Comments: Vital signs were monitored before and after the procedure. Patient was prepped and draped in the usual sterile fashion. The correct patient, procedure, and site was verified.   Injection Procedure Details:   Procedure diagnoses: Lumbar radiculopathy [M54.16]    Meds Administered:  Meds ordered this encounter  Medications   DISCONTD: methylPREDNISolone acetate (DEPO-MEDROL) injection 40 mg    Laterality: Bilateral  Location/Site: L5  Needle:5.0 in., 22 ga.  Short bevel or Quincke spinal needle  Needle Placement: Transforaminal  Findings:    -Comments: Excellent flow of contrast along the nerve, nerve root and into the epidural space.  Procedure Details: After squaring off the end-plates to get a true AP view, the C-arm was positioned so that an oblique view of the foramen as noted above was visualized. The target area is just inferior to the "nose of the scotty dog" or sub pedicular. The soft tissues overlying this structure were infiltrated with 2-3 ml. of 1% Lidocaine without Epinephrine.  The spinal needle was inserted toward the target using a "trajectory" view along the fluoroscope beam.  Under AP and lateral visualization, the needle was advanced so it did not puncture dura and was located close the 6 O'Clock position of the pedical in AP tracterory. Biplanar projections were used to confirm position. Aspiration was confirmed to be negative for CSF and/or blood. A 1-2 ml. volume of Isovue-250 was injected and flow of contrast was noted at each level. Radiographs were obtained for documentation purposes.   After  attaining the desired flow of contrast documented above, a 0.5 to 1.0 ml test dose of 0.25% Marcaine was injected into each respective transforaminal space.  The patient was observed for 90 seconds post injection.  After no sensory deficits were reported, and normal lower extremity motor function was noted,   the above injectate was administered so that equal amounts of the injectate were placed at each foramen (level) into the transforaminal epidural space.   Additional Comments:  No complications occurred Dressing: 2 x 2 sterile gauze and Band-Aid    Post-procedure details: Patient was observed during the procedure. Post-procedure instructions were reviewed.  Patient left the clinic in stable condition.

## 2023-07-08 ENCOUNTER — Other Ambulatory Visit: Payer: Self-pay | Admitting: Student

## 2023-07-08 DIAGNOSIS — J45990 Exercise induced bronchospasm: Secondary | ICD-10-CM

## 2023-07-12 ENCOUNTER — Telehealth: Payer: Self-pay | Admitting: Primary Care

## 2023-07-12 ENCOUNTER — Other Ambulatory Visit (INDEPENDENT_AMBULATORY_CARE_PROVIDER_SITE_OTHER): Payer: Self-pay | Admitting: Primary Care

## 2023-07-12 DIAGNOSIS — J45990 Exercise induced bronchospasm: Secondary | ICD-10-CM

## 2023-07-12 NOTE — Telephone Encounter (Signed)
Requested medications are due for refill today.  yes  Requested medications are on the active medications list.  yes  Last refill. 06/07/2022 75mL 6 rf  Future visit scheduled.   yes  Notes to clinic.  Rx signed by Marrianne Mood.    Requested Prescriptions  Pending Prescriptions Disp Refills   albuterol (PROVENTIL) (2.5 MG/3ML) 0.083% nebulizer solution 75 mL 6    Sig: Take 3 mLs (2.5 mg total) by nebulization every 6 hours as needed for wheezing or shortness of breath.     Pulmonology:  Beta Agonists 2 Passed - 07/12/2023 12:24 PM      Passed - Last BP in normal range    BP Readings from Last 1 Encounters:  07/01/23 117/78         Passed - Last Heart Rate in normal range    Pulse Readings from Last 1 Encounters:  07/01/23 85         Passed - Valid encounter within last 12 months    Recent Outpatient Visits           4 months ago Encounter to establish care   [facility] Grayce Sessions, NP       Future Appointments             Tomorrow Grayce Sessions, NP [facility]

## 2023-07-12 NOTE — Telephone Encounter (Signed)
Medication Refill - Medication: Albuterol Sulfate (2.5 MG/3ML) 0.083%  Med was denied for appt, pt now has appt for 11/01  Has the patient contacted their pharmacy? yes (Agent: If yes, when and what did the pharmacy advise?)contact pcp  Preferred Pharmacy (with phone number or street name):  [id number] - [place] ([place]), [place] - [address]  [address] [place] ([place]) [place] [address]  Phone: [phone] Fax: [phone]   Has the patient been seen for an appointment in the last year OR does the patient have an upcoming appointment? yes  Agent: Please be advised that RX refills may take up to 3 business days. We ask that you follow-up with your pharmacy.

## 2023-07-12 NOTE — Telephone Encounter (Signed)
Copied from CRM [id number]. Topic: General - Other >> Jul 12, 2023 11:34 AM Turkey B wrote: Reason for CRM: pt called in , needs a new mouthpiece with mask for her nebulizer

## 2023-07-13 ENCOUNTER — Telehealth (INDEPENDENT_AMBULATORY_CARE_PROVIDER_SITE_OTHER): Payer: Medicaid Other | Admitting: Primary Care

## 2023-07-13 DIAGNOSIS — Z760 Encounter for issue of repeat prescription: Secondary | ICD-10-CM

## 2023-07-13 DIAGNOSIS — J441 Chronic obstructive pulmonary disease with (acute) exacerbation: Secondary | ICD-10-CM

## 2023-07-13 DIAGNOSIS — J45990 Exercise induced bronchospasm: Secondary | ICD-10-CM

## 2023-07-13 DIAGNOSIS — J45909 Unspecified asthma, uncomplicated: Secondary | ICD-10-CM | POA: Diagnosis not present

## 2023-07-13 DIAGNOSIS — J329 Chronic sinusitis, unspecified: Secondary | ICD-10-CM

## 2023-07-13 MED ORDER — ALBUTEROL SULFATE HFA 108 (90 BASE) MCG/ACT IN AERS
2.0000 | INHALATION_SPRAY | Freq: Four times a day (QID) | RESPIRATORY_TRACT | 2 refills | Status: DC | PRN
Start: 2023-07-13 — End: 2023-11-22

## 2023-07-13 MED ORDER — GUAIFENESIN 200 MG PO TABS
200.0000 mg | ORAL_TABLET | ORAL | 0 refills | Status: DC | PRN
Start: 2023-07-13 — End: 2023-10-18

## 2023-07-13 MED ORDER — FLUTICASONE PROPIONATE 50 MCG/ACT NA SUSP: 1.0000 | Freq: Every day | NASAL | 1 refills | Status: DC

## 2023-07-13 MED ORDER — ALBUTEROL SULFATE (2.5 MG/3ML) 0.083% IN NEBU: 2.5000 mg | INHALATION_SOLUTION | RESPIRATORY_TRACT | 6 refills | Status: DC

## 2023-07-13 MED ORDER — CETIRIZINE HCL 10 MG PO TABS: 10.0000 mg | ORAL_TABLET | Freq: Every day | ORAL | 1 refills | Status: DC

## 2023-07-13 MED ORDER — MOMETASONE FURO-FORMOTEROL FUM 100-5 MCG/ACT IN AERO
2.0000 | INHALATION_SPRAY | Freq: Two times a day (BID) | RESPIRATORY_TRACT | 3 refills | Status: DC
Start: 2023-07-13 — End: 2024-03-24

## 2023-07-13 NOTE — Progress Notes (Signed)
[facility]  Virtual Visit  I connected with Briana Parker, on 07/13/2023 at 8:53 AM through an audio and video application  and verified that I am speaking with the correct person using two identifiers.   Consent: I discussed the limitations, risks, security and privacy concerns of performing an evaluation and management service by telephone and the availability of in person appointments. I also discussed with the patient that there may be a patient responsible charge related to this service. The patient expressed understanding and agreed to proceed.   Location of Patient: Home  Location of Provider: [facility]   Persons participating in Telemedicine visit: Dewitt Hoes,  NP   History of Present Illness: COPD, Follow up/ asthma allergies   She was last seen for this 1 years ago.Marrianne Mood, MD  previously manage  Changes made include  none .   She reports good compliance with treatment. She is  not having side effects.  she uses rescue inhaler 4 per weeks. She IS experiencing dyspnea, cough, wheezing, increased sputum, and colored sputum. She is NOT experiencing weight loss, chills, or fever. she reports breathing is Improved.  Pulmonary Functions Testing Results: referring to Pulmonary   No results found for: "FEV1", "FVC", "FEV1FVC", "TLC"  -----------------------------------------------------------------------------------------    Past Medical History:  Diagnosis Date   Allergic rhinitis    Allergy    seasonal   Anxiety    Arthritis    back   Chronic bronchitis (HCC)    COPD (chronic obstructive pulmonary disease) (HCC)    Depression    Fibromyalgia    GERD (gastroesophageal reflux disease)    Hyperlipidemia    Low back pain    Allergies  Allergen Reactions   Augmentin [Amoxicillin-Pot Clavulanate] Diarrhea   Codeine     REACTION: unknown reaction    Hydrocodone    Ibuprofen Swelling   Nortriptyline     Nightmares   Pravastatin Sodium     REACTION: Pruritis and muscle pain   Simvastatin     REACTION: Abdominal pain, nausea, vomitting    Current Outpatient Medications on File Prior to Visit  Medication Sig Dispense Refill   albuterol (PROVENTIL) (2.5 MG/3ML) 0.083% nebulizer solution Take 3 mLs (2.5 mg total) by nebulization every 6 hours as needed for wheezing or shortness of breath. 75 mL 6   albuterol (VENTOLIN HFA) 108 (90 Base) MCG/ACT inhaler Inhale 2 puffs into the lungs 4 (four) times daily as needed for wheezing or shortness of breath. 6.7 g 2   baclofen (LIORESAL) 10 MG tablet Take 1 tablet (10 mg total) by mouth 3 (three) times daily. 30 tablet 0   cetirizine (ZYRTEC) 10 MG tablet Take by mouth.     fluticasone (FLONASE) 50 MCG/ACT nasal spray 1 spray Once Daily.     mometasone-formoterol (DULERA) 100-5 MCG/ACT AERO Inhale 2 puffs into the lungs 2 (two) times daily. 13 g 3   omeprazole (PRILOSEC) 40 MG capsule Take as needed for acid reflux . This is not a daily medicatio 90 capsule 0   rosuvastatin (CRESTOR) 10 MG tablet Take 1 tablet (10 mg total) by mouth daily. 90 tablet 0   No current facility-administered medications on file prior to visit.    Observations/Objective: Coughing , nasal congestion, wheezing , shortness of breath  Diagnoses and all orders for this visit:   Chronic sinusitis, unspecified location -     guaiFENesin 200 MG tablet; Take 1 tablet (200  mg total) by mouth every 4 (four) hours as needed for cough or to loosen phlegm. -     cetirizine (ZYRTEC) 10 MG tablet; Take 1 tablet (10 mg total) by mouth daily. -     fluticasone (FLONASE) 50 MCG/ACT nasal spray; Place 1 spray into both nostrils daily. -     Ambulatory referral to Pulmonology  COPD exacerbation (HCC) -     For home use only DME Other see comment -     guaiFENesin 200 MG tablet; Take 1 tablet (200 mg total) by mouth every 4 (four)  hours as needed for cough or to loosen phlegm. -     mometasone-formoterol (DULERA) 100-5 MCG/ACT AERO; Inhale 2 puffs into the lungs 2 (two) times daily. -     Ambulatory referral to Pulmonology  Moderate asthma without complication, unspecified whether persistent -     albuterol (PROVENTIL) (2.5 MG/3ML) 0.083% nebulizer solution; Take 3 mLs (2.5 mg total) by nebulization every 6 hours as needed for wheezing or shortness of breath. -     albuterol (VENTOLIN HFA) 108 (90 Base) MCG/ACT inhaler; Inhale 2 puffs into the lungs 4 (four) times daily as needed for wheezing or shortness of breath. -     mometasone-formoterol (DULERA) 100-5 MCG/ACT AERO; Inhale 2 puffs into the lungs 2 (two) times daily. -     Ambulatory referral to Pulmonology  Medication refill -     albuterol (PROVENTIL) (2.5 MG/3ML) 0.083% nebulizer solution; Take 3 mLs (2.5 mg total) by nebulization every 6 hours as needed for wheezing or shortness of breath. -     albuterol (VENTOLIN HFA) 108 (90 Base) MCG/ACT inhaler; Inhale 2 puffs into the lungs 4 (four) times daily as needed for wheezing or shortness of breath. -     fluticasone (FLONASE) 50 MCG/ACT nasal spray; Place 1 spray into both nostrils daily. -     mometasone-formoterol (DULERA) 100-5 MCG/ACT AERO; Inhale 2 puffs into the lungs 2 (two) times daily.     I discussed the assessment and treatment plan with the patient. The patient was provided an opportunity to ask questions and all were answered. The patient agreed with the plan and demonstrated an understanding of the instructions.   The patient was advised to call back or seek an in-person evaluation if the symptoms worsen or if the condition fails to improve as anticipated.     I provided 30 minutes total of non-face-to-face time during this encounter including median intraservice time, reviewing previous notes, investigations, ordering medications, medical decision making, coordinating care and patient verbalized  understanding at the end of the visit.    This note has been created with Education officer, environmental. Any transcriptional errors are unintentional.   Grayce Sessions, NP 07/13/2023, 8:53 AM

## 2023-07-13 NOTE — Telephone Encounter (Signed)
Provider did rx will fax

## 2023-07-16 ENCOUNTER — Telehealth (INDEPENDENT_AMBULATORY_CARE_PROVIDER_SITE_OTHER): Payer: Self-pay

## 2023-07-16 NOTE — Telephone Encounter (Unsigned)
Copied from CRM [id number]. Topic: Referral - Request for Referral >> Jul 16, 2023  8:12 AM Everette C wrote: Has patient seen PCP for this complaint? No. *If NO, is insurance requiring patient see PCP for this issue before PCP can refer them? Referral for which specialty: dermatology  Preferred provider/office: patient has no preference, but would like to remain in [facility]  Reason for referral: right eyelid skin irritation

## 2023-07-17 ENCOUNTER — Encounter (INDEPENDENT_AMBULATORY_CARE_PROVIDER_SITE_OTHER): Payer: Self-pay

## 2023-07-23 DIAGNOSIS — H0011 Chalazion right upper eyelid: Secondary | ICD-10-CM | POA: Diagnosis not present

## 2023-07-23 NOTE — Telephone Encounter (Signed)
Pt is again to report that she never received mask for her nebulizer . Please advise Cb- [phone]

## 2023-07-23 NOTE — Telephone Encounter (Signed)
Pt is calling ask can the nebulizer mak be sent to  [facility] - [place] ([place]), [place] - [address]  [address] [place] ([place]) [place] [address]  Phone: [phone] Fax: [phone]  Hours: Not open 24 hours

## 2023-07-23 NOTE — Telephone Encounter (Signed)
Returned pt call. Pt didn't answer left a detailed vm   RX for nebulizer mask was faxed on 07/16/23 to [facility]. If pt is requesting rx to be sent to another pharmacy she will just to let me know or she can send a mychart message

## 2023-07-23 NOTE — Telephone Encounter (Signed)
Returned pt call and made aware that [facility] doesn't sell nebulizer mask. Made pt aware that the rx has to go to a medical supply company. Pt asked where did the rx was sent to and made pt aware. Pt will reach out to [facility]

## 2023-07-27 ENCOUNTER — Other Ambulatory Visit (INDEPENDENT_AMBULATORY_CARE_PROVIDER_SITE_OTHER): Payer: Self-pay

## 2023-07-27 DIAGNOSIS — J45909 Unspecified asthma, uncomplicated: Secondary | ICD-10-CM | POA: Diagnosis not present

## 2023-07-27 MED ORDER — MISC. DEVICES MISC: 0 refills | Status: AC

## 2023-07-27 NOTE — Telephone Encounter (Signed)
Pt would like Briana Parker to call her concerning nebulizer mask. Pharmacy does not have mask. Pt states she needs a new machine as well.  PLease advise

## 2023-07-27 NOTE — Telephone Encounter (Signed)
Returned pt call and made aware that she can come by the office to pick up a new machine.

## 2023-08-01 ENCOUNTER — Other Ambulatory Visit (INDEPENDENT_AMBULATORY_CARE_PROVIDER_SITE_OTHER): Payer: Self-pay

## 2023-08-01 ENCOUNTER — Telehealth (INDEPENDENT_AMBULATORY_CARE_PROVIDER_SITE_OTHER): Payer: Self-pay | Admitting: Primary Care

## 2023-08-01 DIAGNOSIS — J45909 Unspecified asthma, uncomplicated: Secondary | ICD-10-CM

## 2023-08-01 NOTE — Telephone Encounter (Signed)
Returned pt call and made aware that the machine she was given was not a used machine. Pt is requesting a rx to be sent to [facility] for machine. Rx has been faxed

## 2023-08-01 NOTE — Telephone Encounter (Signed)
Patient called and states that last Friday her PCP Gwinda Passe gave her a nebulizer machine and patient states she was given a used machine and she wants a new machine ordered, not a used one. Patient states the solution is not coming out like it should be and she needs a brand new nebulizer machine sent to [facility] (where she picked up her masks and cords from).  Please follow back up with patient @ phone # [phone]

## 2023-08-06 DIAGNOSIS — H0011 Chalazion right upper eyelid: Secondary | ICD-10-CM | POA: Diagnosis not present

## 2023-08-08 ENCOUNTER — Telehealth (INDEPENDENT_AMBULATORY_CARE_PROVIDER_SITE_OTHER): Payer: Self-pay | Admitting: Primary Care

## 2023-08-08 NOTE — Telephone Encounter (Signed)
Briana Parker with [facility] is calling in because she received a referral for a nebulizer but there was a note on the order that the pt had a prescription sent to [facility] and she wanted to verify if that was correct. Charlotte Sanes says [employer] will not cover two so she needs to know if pt is going to obtain the nebulizer through [employer] or [employer]. Please follow up with [facility]. Fax: [phone]

## 2023-08-14 NOTE — Telephone Encounter (Signed)
Tried reaching out to pt to see if she was able to get machine from [facility] pt didn't answer and was unable to lvm  Will try to reach pt again another day before returning call to [facility]

## 2023-08-17 ENCOUNTER — Telehealth (INDEPENDENT_AMBULATORY_CARE_PROVIDER_SITE_OTHER): Payer: Self-pay

## 2023-08-17 NOTE — Telephone Encounter (Signed)
Called patient to inform them that they could call and see if the Ortho accepts their insurance. If so, she could schedule her own appointment. Please advise

## 2023-08-17 NOTE — Telephone Encounter (Signed)
Copied from CRM [id number]. Topic: Referral - Request for Referral >> Aug 17, 2023  8:49 AM Everette C wrote: Did the patient discuss referral with their provider in the last year? Yes (If No - schedule appointment) (If Yes - send message)  Appointment offered? No  Type of order/referral and detailed reason for visit: Eye Doctor / Eye examination   Preference of office, provider, location: Patient would like to be seen in [place] and accept their insurance   If referral order, have you been seen by this specialty before? No (If Yes, this issue or another issue? When? Where?  Can we respond through MyChart? No

## 2023-08-17 NOTE — Telephone Encounter (Signed)
Please reach out to pt and make aware she doesn't need a referral she can reach out to any ophthalmologist and  see if they take her medicaid and if they do she can schedule her own appointment with them

## 2023-08-22 ENCOUNTER — Telehealth: Payer: Self-pay | Admitting: Physical Medicine and Rehabilitation

## 2023-08-22 DIAGNOSIS — M5416 Radiculopathy, lumbar region: Secondary | ICD-10-CM

## 2023-08-22 NOTE — Telephone Encounter (Signed)
Patient called and wants to see about getting the epidural shot. CB#[phone]

## 2023-08-27 ENCOUNTER — Telehealth (INDEPENDENT_AMBULATORY_CARE_PROVIDER_SITE_OTHER): Payer: Self-pay

## 2023-08-27 NOTE — Telephone Encounter (Signed)
Please reach out to pt and make aware that we can mail or she can by the office to pick up the eye resources for medicaid.

## 2023-08-27 NOTE — Telephone Encounter (Signed)
Noted and form will be mailed out tom 12/17

## 2023-08-27 NOTE — Telephone Encounter (Signed)
Copied from CRM [id number]. Topic: Referral - Request for Referral >> Aug 27, 2023  9:39 AM Priscille Loveless wrote: Reason for CRM: Pt is calling stating that the eye dr that she was referred to does not take MCD. She is wanting a list of eye drs that does take MCD or another referral to one that does. Thanks.

## 2023-08-29 DIAGNOSIS — H0011 Chalazion right upper eyelid: Secondary | ICD-10-CM | POA: Diagnosis not present

## 2023-09-06 ENCOUNTER — Other Ambulatory Visit: Payer: Self-pay

## 2023-09-06 ENCOUNTER — Ambulatory Visit: Payer: Medicaid Other | Admitting: Physical Medicine and Rehabilitation

## 2023-09-06 DIAGNOSIS — M5416 Radiculopathy, lumbar region: Secondary | ICD-10-CM

## 2023-09-06 MED ORDER — METHYLPREDNISOLONE ACETATE 40 MG/ML IJ SUSP
40.0000 mg | Freq: Once | INTRAMUSCULAR | Status: AC
Start: 2023-09-06 — End: 2023-09-06
  Administered 2023-09-06: 40 mg

## 2023-09-06 NOTE — Patient Instructions (Signed)

## 2023-09-06 NOTE — Procedures (Signed)
Lumbosacral Transforaminal Epidural Steroid Injection - Sub-Pedicular Approach with Fluoroscopic Guidance  Patient: Briana Parker      Date of Birth: 03/01/1966 MRN: [id number] PCP: Grayce Sessions, NP      Visit Date: 09/06/2023   Universal Protocol:    Date/Time: 09/06/2023  Consent Given By: the patient  Position: PRONE  Additional Comments: Vital signs were monitored before and after the procedure. Patient was prepped and draped in the usual sterile fashion. The correct patient, procedure, and site was verified.   Injection Procedure Details:   Procedure diagnoses: Lumbar radiculopathy [M54.16]    Meds Administered:  Meds ordered this encounter  Medications   methylPREDNISolone acetate (DEPO-MEDROL) injection 40 mg    Laterality: Bilateral  Location/Site: L5  Needle:5.0 in., 22 ga.  Short bevel or Quincke spinal needle  Needle Placement: Transforaminal  Findings:    -Comments: Excellent flow of contrast along the nerve, nerve root and into the epidural space.  Procedure Details: After squaring off the end-plates to get a true AP view, the C-arm was positioned so that an oblique view of the foramen as noted above was visualized. The target area is just inferior to the "nose of the scotty dog" or sub pedicular. The soft tissues overlying this structure were infiltrated with 2-3 ml. of 1% Lidocaine without Epinephrine.  The spinal needle was inserted toward the target using a "trajectory" view along the fluoroscope beam.  Under AP and lateral visualization, the needle was advanced so it did not puncture dura and was located close the 6 O'Clock position of the pedical in AP tracterory. Biplanar projections were used to confirm position. Aspiration was confirmed to be negative for CSF and/or blood. A 1-2 ml. volume of Isovue-250 was injected and flow of contrast was noted at each level. Radiographs were obtained for documentation purposes.   After attaining the  desired flow of contrast documented above, a 0.5 to 1.0 ml test dose of 0.25% Marcaine was injected into each respective transforaminal space.  The patient was observed for 90 seconds post injection.  After no sensory deficits were reported, and normal lower extremity motor function was noted,   the above injectate was administered so that equal amounts of the injectate were placed at each foramen (level) into the transforaminal epidural space.   Additional Comments:  No complications occurred Dressing: 2 x 2 sterile gauze and Band-Aid    Post-procedure details: Patient was observed during the procedure. Post-procedure instructions were reviewed.  Patient left the clinic in stable condition.

## 2023-09-06 NOTE — Progress Notes (Signed)
Functional Pain Scale - descriptive words and definitions  Uncomfortable (3)  Pain is present but can complete all ADL's/sleep is slightly affected and passive distraction only gives marginal relief. Mild range order  Average Pain 3  128/85 L>R +Driver, -BT, -Dye Allergies.

## 2023-09-06 NOTE — Progress Notes (Addendum)
Briana Parker - 57 y.o. female MRN [id number]  Date of birth: 1966-08-19  Office Visit Note: Visit Date: 09/06/2023 PCP: Grayce Sessions, NP Referred by: Grayce Sessions, NP  Subjective: Chief Complaint  Patient presents with   Lower Back - Pain   HPI:  Briana Parker is a 57 y.o. female who comes in today for planned repeat Bilateral L5-S1  Lumbar Transforaminal epidural steroid injection with fluoroscopic guidance.  The patient has failed conservative care including home exercise, medications, time and activity modification.  This injection will be diagnostic and hopefully therapeutic.  Please see requesting physician notes for further details and justification. Patient received more than 50% pain relief from prior injection.   Referring: Ellin Goodie, FNP   ROS Otherwise per HPI.  Assessment & Plan: Visit Diagnoses:    ICD-10-CM   1. Lumbar radiculopathy  M54.16 XR C-ARM NO REPORT    Epidural Steroid injection    methylPREDNISolone acetate (DEPO-MEDROL) injection 40 mg      Plan: No additional findings.   Meds & Orders:  Meds ordered this encounter  Medications   methylPREDNISolone acetate (DEPO-MEDROL) injection 40 mg    Orders Placed This Encounter  Procedures   XR C-ARM NO REPORT   Epidural Steroid injection    Follow-up: Return for F/U Lorre Munroe, FNP, visit to requesting provider as needed.   Procedures: No procedures performed  Lumbosacral Transforaminal Epidural Steroid Injection - Sub-Pedicular Approach with Fluoroscopic Guidance  Patient: Briana Parker      Date of Birth: Dec 19, 1965 MRN: [id number] PCP: Grayce Sessions, NP      Visit Date: 09/06/2023   Universal Protocol:    Date/Time: 09/06/2023  Consent Given By: the patient  Position: PRONE  Additional Comments: Vital signs were monitored before and after the procedure. Patient was prepped and draped in the usual sterile fashion. The correct patient, procedure, and  site was verified.   Injection Procedure Details:   Procedure diagnoses: Lumbar radiculopathy [M54.16]    Meds Administered:  Meds ordered this encounter  Medications   methylPREDNISolone acetate (DEPO-MEDROL) injection 40 mg    Laterality: Bilateral  Location/Site: L5  Needle:5.0 in., 22 ga.  Short bevel or Quincke spinal needle  Needle Placement: Transforaminal  Findings:    -Comments: Excellent flow of contrast along the nerve, nerve root and into the epidural space.  Procedure Details: After squaring off the end-plates to get a true AP view, the C-arm was positioned so that an oblique view of the foramen as noted above was visualized. The target area is just inferior to the "nose of the scotty dog" or sub pedicular. The soft tissues overlying this structure were infiltrated with 2-3 ml. of 1% Lidocaine without Epinephrine.  The spinal needle was inserted toward the target using a "trajectory" view along the fluoroscope beam.  Under AP and lateral visualization, the needle was advanced so it did not puncture dura and was located close the 6 O'Clock position of the pedical in AP tracterory. Biplanar projections were used to confirm position. Aspiration was confirmed to be negative for CSF and/or blood. A 1-2 ml. volume of Isovue-250 was injected and flow of contrast was noted at each level. Radiographs were obtained for documentation purposes.   After attaining the desired flow of contrast documented above, a 0.5 to 1.0 ml test dose of 0.25% Marcaine was injected into each respective transforaminal space.  The patient was observed for 90 seconds post injection.  After no sensory deficits were reported, and  normal lower extremity motor function was noted,   the above injectate was administered so that equal amounts of the injectate were placed at each foramen (level) into the transforaminal epidural space.   Additional Comments:  No complications occurred Dressing: 2 x 2 sterile  gauze and Band-Aid    Post-procedure details: Patient was observed during the procedure. Post-procedure instructions were reviewed.  Patient left the clinic in stable condition.    Clinical History: CLINICAL DATA:  Low back pain with radiculopathy   EXAM: MRI LUMBAR SPINE WITHOUT CONTRAST   TECHNIQUE: Multiplanar, multisequence MR imaging of the lumbar spine was performed. No intravenous contrast was administered.   COMPARISON:  MRI 07/25/2012   FINDINGS: Segmentation:  Standard.   Alignment:  Physiologic.   Vertebrae:  No fracture, evidence of discitis, or bone lesion.   Conus medullaris and cauda equina: Conus extends to the T12-L1 level. Conus and cauda equina appear normal.   Paraspinal and other soft tissues: Negative.   Disc levels:   T12-L1 and L1-L2: Unremarkable.   L2-L3: Minimal annular disc bulge.  No foraminal or canal stenosis.   L3-L4: Annular disc bulge without foraminal or canal stenosis.   L4-L5: Asymmetric disc height loss on the right. Broad-based disc bulge, eccentric to the right. Mild bilateral facet hypertrophy. Mild right subarticular recess stenosis without canal stenosis. Mild right foraminal stenosis. The previously seen large right paracentral disc protrusion at this level has largely receded.   L5-S1: Disc bulge and endplate spurring, eccentric to the left. Mild facet hypertrophy. No canal stenosis. Moderate-severe left foraminal stenosis. Borderline-mild right foraminal stenosis. Findings have progressed from prior.   IMPRESSION: 1. Multilevel lumbar spondylosis, most pronounced at L5-S1 where there is moderate-severe left foraminal stenosis. Findings have progressed from prior. 2. Mild right foraminal stenosis at L4-L5. The previously seen large right paracentral disc protrusion at this level has largely receded. 3. No canal stenosis at any level.     Electronically Signed   By: Duanne Guess D.O.   On: 06/01/2023 10:42      Objective:  VS:  HT:    WT:   BMI:     BP:   HR: bpm  TEMP: ( )  RESP:  Physical Exam Vitals and nursing note reviewed.  Constitutional:      General: She is not in acute distress.    Appearance: Normal appearance. She is not ill-appearing.  HENT:     Head: Normocephalic and atraumatic.     Right Ear: External ear normal.     Left Ear: External ear normal.  Eyes:     Extraocular Movements: Extraocular movements intact.  Cardiovascular:     Rate and Rhythm: Normal rate.     Pulses: Normal pulses.  Pulmonary:     Effort: Pulmonary effort is normal. No respiratory distress.  Abdominal:     General: There is no distension.     Palpations: Abdomen is soft.  Musculoskeletal:        General: Tenderness present.     Cervical back: Neck supple.     Right lower leg: No edema.     Left lower leg: No edema.     Comments: Patient has good distal strength with no pain over the greater trochanters.  No clonus or focal weakness.  Skin:    Findings: No erythema, lesion or rash.  Neurological:     General: No focal deficit present.     Mental Status: She is alert and oriented to person, place, and  time.     Sensory: No sensory deficit.     Motor: No weakness or abnormal muscle tone.     Coordination: Coordination normal.  Psychiatric:        Mood and Affect: Mood normal.        Behavior: Behavior normal.      Imaging: Epidural Steroid injection Result Date: 09/06/2023 Tyrell Antonio, MD     09/06/2023  9:07 AM Lumbosacral Transforaminal Epidural Steroid Injection - Sub-Pedicular Approach with Fluoroscopic Guidance Patient: Kimberlee Sicking     Date of Birth: 02-05-1966 MRN: [id number] PCP: Grayce Sessions, NP     Visit Date: 09/06/2023  Universal Protocol:   Date/Time: 09/06/2023 Consent Given By: the patient Position: PRONE Additional Comments: Vital signs were monitored before and after the procedure. Patient was prepped and draped in the usual sterile fashion. The correct  patient, procedure, and site was verified. Injection Procedure Details: Procedure diagnoses: Lumbar radiculopathy [M54.16]  Meds Administered: Meds ordered this encounter Medications  methylPREDNISolone acetate (DEPO-MEDROL) injection 40 mg Laterality: Bilateral Location/Site: L5 Needle:5.0 in., 22 ga.  Short bevel or Quincke spinal needle Needle Placement: Transforaminal Findings:   -Comments: Excellent flow of contrast along the nerve, nerve root and into the epidural space. Procedure Details: After squaring off the end-plates to get a true AP view, the C-arm was positioned so that an oblique view of the foramen as noted above was visualized. The target area is just inferior to the "nose of the scotty dog" or sub pedicular. The soft tissues overlying this structure were infiltrated with 2-3 ml. of 1% Lidocaine without Epinephrine. The spinal needle was inserted toward the target using a "trajectory" view along the fluoroscope beam.  Under AP and lateral visualization, the needle was advanced so it did not puncture dura and was located close the 6 O'Clock position of the pedical in AP tracterory. Biplanar projections were used to confirm position. Aspiration was confirmed to be negative for CSF and/or blood. A 1-2 ml. volume of Isovue-250 was injected and flow of contrast was noted at each level. Radiographs were obtained for documentation purposes. After attaining the desired flow of contrast documented above, a 0.5 to 1.0 ml test dose of 0.25% Marcaine was injected into each respective transforaminal space.  The patient was observed for 90 seconds post injection.  After no sensory deficits were reported, and normal lower extremity motor function was noted,   the above injectate was administered so that equal amounts of the injectate were placed at each foramen (level) into the transforaminal epidural space. Additional Comments: No complications occurred Dressing: 2 x 2 sterile gauze and Band-Aid  Post-procedure  details: Patient was observed during the procedure. Post-procedure instructions were reviewed. Patient left the clinic in stable condition.

## 2023-09-09 ENCOUNTER — Other Ambulatory Visit (INDEPENDENT_AMBULATORY_CARE_PROVIDER_SITE_OTHER): Payer: Self-pay | Admitting: Primary Care

## 2023-09-24 ENCOUNTER — Telehealth: Payer: Self-pay | Admitting: Physical Medicine and Rehabilitation

## 2023-09-24 NOTE — Telephone Encounter (Signed)
 Patient called asked if Megan call her back. Patient said the matter she wish to discuss is personal.  Patient said she need to discuss her appointment.  The number to contact patient is [phone]

## 2023-09-28 ENCOUNTER — Ambulatory Visit: Payer: Medicaid Other | Admitting: Physical Medicine and Rehabilitation

## 2023-10-01 ENCOUNTER — Other Ambulatory Visit (INDEPENDENT_AMBULATORY_CARE_PROVIDER_SITE_OTHER): Payer: Self-pay | Admitting: Primary Care

## 2023-10-01 NOTE — Telephone Encounter (Signed)
Requested Prescriptions  Pending Prescriptions Disp Refills   omeprazole (PRILOSEC) 40 MG capsule [Pharmacy Med Name: OMEPRAZOLE DR 40MG   CAP] 90 capsule 0    Sig: TAKE AS NEEDED FOR ACID REFLUX. THIS IS NOT A DAILY MEDICATION.     Gastroenterology: Proton Pump Inhibitors Passed - 10/01/2023  4:27 PM      Passed - Valid encounter within last 12 months    Recent Outpatient Visits           2 months ago Asthma, exercise induced   [place] [facility] Grayce Sessions, NP   7 months ago Encounter to establish care   [place] [facility] Grayce Sessions, NP

## 2023-10-18 ENCOUNTER — Ambulatory Visit (INDEPENDENT_AMBULATORY_CARE_PROVIDER_SITE_OTHER): Payer: Self-pay | Admitting: Primary Care

## 2023-10-18 ENCOUNTER — Other Ambulatory Visit (INDEPENDENT_AMBULATORY_CARE_PROVIDER_SITE_OTHER): Payer: Self-pay | Admitting: Primary Care

## 2023-10-18 ENCOUNTER — Telehealth (INDEPENDENT_AMBULATORY_CARE_PROVIDER_SITE_OTHER): Payer: Medicaid Other | Admitting: Primary Care

## 2023-10-18 DIAGNOSIS — J9801 Acute bronchospasm: Secondary | ICD-10-CM | POA: Diagnosis not present

## 2023-10-18 DIAGNOSIS — R062 Wheezing: Secondary | ICD-10-CM

## 2023-10-18 DIAGNOSIS — Z20828 Contact with and (suspected) exposure to other viral communicable diseases: Secondary | ICD-10-CM | POA: Diagnosis not present

## 2023-10-18 DIAGNOSIS — J441 Chronic obstructive pulmonary disease with (acute) exacerbation: Secondary | ICD-10-CM

## 2023-10-18 DIAGNOSIS — J329 Chronic sinusitis, unspecified: Secondary | ICD-10-CM | POA: Diagnosis not present

## 2023-10-18 MED ORDER — OSELTAMIVIR PHOSPHATE 75 MG PO CAPS: 75.0000 mg | ORAL_CAPSULE | Freq: Two times a day (BID) | ORAL | 0 refills | Status: DC

## 2023-10-18 MED ORDER — GUAIFENESIN 200 MG PO TABS
200.0000 mg | ORAL_TABLET | ORAL | 1 refills | Status: DC | PRN
Start: 2023-10-18 — End: 2023-10-19

## 2023-10-18 MED ORDER — IPRATROPIUM BROMIDE 0.02 % IN SOLN: 0.5000 mg | Freq: Four times a day (QID) | RESPIRATORY_TRACT | 12 refills | Status: DC

## 2023-10-18 NOTE — Patient Instructions (Addendum)
 Placed in [facility]  [address] [place] [address] Ph# [phone] Influenza, Adult Influenza is also called the flu. It's an infection that affects your respiratory tract. This includes your nose, throat, windpipe, and lungs. The flu is contagious. This means it spreads easily from person to person. It causes symptoms that are like a cold. It can also cause a high fever and body aches. What are the causes? The flu is caused by the influenza virus. You can get it by: Breathing in droplets that are in the air after an infected person coughs or sneezes. Touching something that has the virus on it and then touching your mouth, nose, or eyes. What increases the risk? You may be more likely to get the flu if: You don't wash your hands often. You're near a lot of people during cold and flu season. You touch your mouth, eyes, or nose without washing your hands first. You don't get a flu shot each year. You may also be more at risk for the flu and serious problems, such as a lung infection called pneumonia, if: You're older than 65. You're pregnant. Your immune system is weak. Your immune system is your body's defense system. You have a long-term, or chronic, condition, such as: Heart, kidney, or lung disease. Diabetes. A liver disorder. Asthma. You're very overweight. You have anemia. This is when you don't have enough red blood cells in your body. What are the signs or symptoms? Flu symptoms often start all of a sudden. They may last 4-14 days and include: Fever and chills. Headaches, body aches, or muscle aches. Sore throat. Cough. Runny or stuffy nose. Discomfort in your chest. Not wanting to eat as much as normal. Feeling weak or tired. Feeling dizzy. Nausea or vomiting. How is this diagnosed? The flu may be diagnosed based on your symptoms and medical history. You may also have a physical exam. A swab may be taken from your nose or throat and tested for the  virus. How is this treated? If the flu is found early, you can be treated with antiviral medicine. This may be given to you by mouth or through an IV. It can help you feel less sick and get better faster. Taking care of yourself at home can also help your symptoms get better. Your health care provider may tell you to: Take over-the-counter medicines. Drink lots of fluids. The flu often goes away on its own. If you have very bad symptoms or problems caused by the flu, you may need to be treated in a hospital. Follow these instructions at home: Activity Rest as needed. Get lots of sleep. Stay home from work or school as told by your provider. Leave home only to go see your provider. Do not leave home for other reasons until you don't have a fever for 24 hours without taking medicine. Eating and drinking Take an oral rehydration solution (ORS). This is a drink that is sold at pharmacies and stores. Drink enough fluid to keep your pee pale yellow. Try to drink small amounts of clear fluids. These include water, ice chips, fruit juice mixed with water, and low-calorie sports drinks. Try to eat bland foods that are easy to digest. These include bananas, applesauce, rice, lean meats, toast, and crackers. Avoid drinks that have a lot of sugar or caffeine in them. These include energy drinks, regular sports drinks, and soda. Do not drink alcohol. Do not eat spicy or fatty foods. General instructions  Take your medicines only as told by your provider. Use a cool mist humidifier to add moisture to the air in your home. This can make it easier for you to breathe. You should also clean the humidifier every day. To do so: Empty the water. Pour clean water in. Cover your mouth and nose when you cough or sneeze. Wash your hands with soap and water often and for at least 20 seconds. It's extra important to do so after you cough or sneeze. If you can't use soap and water, use hand sanitizer. How is  this prevented?  Get a flu shot every year. Ask your provider when you should get your flu shot. Stay away from people who are sick during fall and winter. Fall and winter are cold and flu season. Contact a health care provider if: You get new symptoms. You have chest pain. You have watery poop, also called diarrhea. You have a fever. Your cough gets worse. You start to have more mucus. You feel like you may vomit, or you vomit. Get help right away if: You become short of breath or have trouble breathing. Your skin or nails turn blue. You have very bad pain or stiffness in your neck. You get a sudden headache or pain in your face or ear. You vomit each time you eat or drink. These symptoms may be an emergency. Call 911 right away. Do not wait to see if the symptoms will go away. Do not drive yourself to the hospital. This information is not intended to replace advice given to you by your health care provider. Make sure you discuss any questions you have with your health care provider. Document Revised: 05/31/2023 Document Reviewed: 10/05/2022 Elsevier Patient Education  2024 [employer].

## 2023-10-18 NOTE — Progress Notes (Signed)
 [facility]  Virtual Visit Note  I connected with Briana Parker, on 10/18/2023 at 9:28 AM through an audio and video application and verified that I am speaking with the correct person using two identifiers.   Consent: I discussed the limitations, risks, security and privacy concerns of performing an evaluation and management service by mychart and the availability of in person appointments. I also discussed with the patient that there may be a patient responsible charge related to this service. The patient expressed understanding and agreed to proceed.   Location of Patient: Home  Location of Provider: [facility]   Persons participating in visit: Jon Browner Briana Celestia,  NP   History of Present Illness: Briana Parker is a 58 year old female who was having a virtual visit because she has been exposed to the flu by 2 other children that go to daycare.  The parents were told to let her know so she can speak with her PCP.  She has cough ,fever as high as 101, body aches and has vomiting but it is clear ,headache and causing eyes to burn and left ear pain. Difficulty trying to keep foods down.    Past Medical History:  Diagnosis Date   Allergic rhinitis    Allergy    seasonal   Anxiety    Arthritis    back   Chronic bronchitis (HCC)    COPD (chronic obstructive pulmonary disease) (HCC)    Depression    Fibromyalgia    GERD (gastroesophageal reflux disease)    Hyperlipidemia    Low back pain    Allergies  Allergen Reactions   Augmentin  [Amoxicillin -Pot Clavulanate] Diarrhea   Codeine      REACTION: unknown reaction   Hydrocodone    Ibuprofen  Swelling   Nortriptyline      Nightmares   Pravastatin Sodium     REACTION: Pruritis and muscle pain   Simvastatin     REACTION: Abdominal pain, nausea, vomitting    Current Outpatient Medications on File Prior to Visit  Medication Sig  Dispense Refill   albuterol  (PROVENTIL ) (2.5 MG/3ML) 0.083% nebulizer solution Take 3 mLs (2.5 mg total) by nebulization every 6 hours as needed for wheezing or shortness of breath. 75 mL 6   albuterol  (VENTOLIN  HFA) 108 (90 Base) MCG/ACT inhaler Inhale 2 puffs into the lungs 4 (four) times daily as needed for wheezing or shortness of breath. 6.7 g 2   baclofen  (LIORESAL ) 10 MG tablet Take 1 tablet (10 mg total) by mouth 3 (three) times daily. 30 tablet 0   cetirizine  (ZYRTEC ) 10 MG tablet Take 1 tablet (10 mg total) by mouth daily. 90 tablet 1   fluticasone  (FLONASE ) 50 MCG/ACT nasal spray Place 1 spray into both nostrils daily. 9.9 mL 1   guaiFENesin  200 MG tablet Take 1 tablet (200 mg total) by mouth every 4 (four) hours as needed for cough or to loosen phlegm. 30 suppository 0   Misc. Devices MISC Nebulizer Mask for nebulizer machine 1 each 0   mometasone -formoterol  (DULERA ) 100-5 MCG/ACT AERO Inhale 2 puffs into the lungs 2 (two) times daily. 13 g 3   omeprazole  (PRILOSEC) 40 MG capsule TAKE AS NEEDED FOR ACID REFLUX. THIS IS NOT A DAILY MEDICATION. 90 capsule 0   rosuvastatin  (CRESTOR ) 10 MG tablet Take 1 tablet by mouth once daily 30 tablet 0   No current facility-administered medications on file prior to visit.    Observations/Objective: See HPI  Assessment and Plan:   Follow Up Instructions: Diagnoses and all orders for this visit:  Bronchospasm -     ipratropium (ATROVENT ) 0.02 % nebulizer solution; Take 2.5 mLs (0.5 mg total) by nebulization 4 (four) times daily.  Wheezing -     ipratropium (ATROVENT ) 0.02 % nebulizer solution; Take 2.5 mLs (0.5 mg total) by nebulization 4 (four) times daily.  Exposure to the flu -     oseltamivir  (TAMIFLU ) 75 MG capsule; Take 1 capsule (75 mg total) by mouth 2 (two) times daily.   Spoke with pulmonologist agreed with tx and actually was calling to schedule appt however he is booked out take first available and he will go from there.   I  discussed the assessment and treatment plan with the patient. The patient was provided an opportunity to ask questions and all were answered. The patient agreed with the plan and demonstrated an understanding of the instructions.   The patient was advised to call back or seek an in-person evaluation if the symptoms worsen or if the condition fails to improve as anticipated.     I provided 35 minutes total time during this encounter including median intraservice time, reviewing previous notes, investigations, ordering medications, medical decision making, coordinating care and patient verbalized understanding at the end of the visit.    This note has been created with Education officer, environmental. Any transcriptional errors are unintentional.   Briana SHAUNNA Bohr, NP 10/18/2023, 9:28 AM

## 2023-10-18 NOTE — Telephone Encounter (Signed)
 First attempt, lvm

## 2023-10-18 NOTE — Telephone Encounter (Signed)
 Requested medication (s) are due for refill today: yes  Requested medication (s) are on the active medication list: yes  Last refill:  09/10/23 #30 0 refills  Future visit scheduled: no seen today  Notes to clinic:  no refill remain. Do you want to refill Rx? Last labs 02/16/23.     Requested Prescriptions  Pending Prescriptions Disp Refills   rosuvastatin  (CRESTOR ) 10 MG tablet [Pharmacy Med Name: ROSUVASTATIN  10MG  TAB] 30 tablet 0    Sig: Take 1 tablet by mouth once daily     Cardiovascular:  Antilipid - Statins 2 Failed - 10/18/2023  3:00 PM      Failed - Lipid Panel in normal range within the last 12 months    Cholesterol, Total  Date Value Ref Range Status  02/16/2023 180 100 - 199 mg/dL Final   LDL Chol Calc (NIH)  Date Value Ref Range Status  02/16/2023 100 (H) 0 - 99 mg/dL Final   HDL  Date Value Ref Range Status  02/16/2023 61 >39 mg/dL Final   Triglycerides  Date Value Ref Range Status  02/16/2023 106 0 - 149 mg/dL Final         Passed - Cr in normal range and within 360 days    Creat  Date Value Ref Range Status  02/11/2015 0.77 0.50 - 1.10 mg/dL Final   Creatinine, Ser  Date Value Ref Range Status  02/16/2023 0.79 0.57 - 1.00 mg/dL Final         Passed - Patient is not pregnant      Passed - Valid encounter within last 12 months    Recent Outpatient Visits           Today Bronchospasm   [place] [facility] Celestia Rosaline SQUIBB, NP   3 months ago Asthma, exercise induced   [place] [facility] Celestia Rosaline SQUIBB, NP   8 months ago Encounter to establish care   [place] [facility] Celestia Rosaline SQUIBB, NP

## 2023-10-18 NOTE — Telephone Encounter (Signed)
 Noted.

## 2023-10-18 NOTE — Telephone Encounter (Signed)
 Copied from CRM [id number]. Topic: Clinical - Red Word Triage >> Oct 18, 2023  8:15 AM Antonio DEL wrote: [facility] that prompted transfer to Nurse Triage: chills, head and body aches, coughing spells, ear hurts, feels like something is in her chest and chest is hurting, started about 2 days ago   Chief Complaint: Flu-like symptoms Symptoms:  Green nasal discharge, chest congestion, body aches, earache, cough, chills Frequency: Ongoing for 2 days Disposition: [] ED /[] Urgent Care (no appt availability in office) / [x] Appointment(In office/virtual)/ []  [place] [facility]/ [] Home Care/ [] Refused Recommended Disposition /[]  Mobile Bus/ []  Follow-up with PCP Additional Notes: Patient stated she has been having flu-like symptoms for a couple days. She stated she has recently been around 2 kids with the flu. Patient believes she needs a prescription to help with her symptoms. Appointment scheduled for this morning. Patient declined to come in because she is not feeling well enough. Virtual appt scheduled.   Reason for Disposition  Earache is present  Answer Assessment - Initial Assessment Questions 1. TYPE of EXPOSURE: How were you exposed? (e.g., close contact, not a close contact)     Exposed to 2 kids with the flu  2. SYMPTOMS: Do you have any symptoms? (e.g., cough, fever, sore throat, difficulty breathing).     Body aches, cough, chills, feeling hot, cough,  Answer Assessment - Initial Assessment Questions 1. ONSET: When did the cough begin?      2 days ago  2. SEVERITY: How bad is the cough today?      Bad cough, interfering with daily activities  3. SPUTUM: Describe the color of your sputum (none, dry cough; clear, white, yellow, green)     N/A  4. DIFFICULTY BREATHING: Are you having difficulty breathing? If Yes, ask: How bad is it? (e.g., mild, moderate, severe)    - MILD: No SOB at rest, mild SOB with walking, speaks normally in sentences, can lie down, no  retractions, pulse < 100.    - MODERATE: SOB at rest, SOB with minimal exertion and prefers to sit, cannot lie down flat, speaks in phrases, mild retractions, audible wheezing, pulse 100-120.    - SEVERE: Very SOB at rest, speaks in single words, struggling to breathe, sitting hunched forward, retractions, pulse > 120      SOB at night during coughing attacks  5. FEVER: Do you have a fever? If Yes, ask: What is your temperature, how was it measured, and when did it start?     Woke up sweating, unsure if has a fever  6. OTHER SYMPTOMS: Do you have any other symptoms? (e.g., runny nose, wheezing, chest pain)       Green nasal discharge, chest congestion, body aches, earache  Protocols used: Influenza (Flu) Exposure-A-AH, Cough - Acute Non-Productive-A-AH

## 2023-10-19 ENCOUNTER — Other Ambulatory Visit: Payer: Self-pay

## 2023-10-19 DIAGNOSIS — J441 Chronic obstructive pulmonary disease with (acute) exacerbation: Secondary | ICD-10-CM

## 2023-10-19 DIAGNOSIS — J329 Chronic sinusitis, unspecified: Secondary | ICD-10-CM

## 2023-10-19 MED ORDER — GUAIFENESIN 200 MG PO TABS: 200.0000 mg | ORAL_TABLET | ORAL | 1 refills | Status: DC | PRN

## 2023-10-19 NOTE — Telephone Encounter (Signed)
 Copied from CRM [id number]. Topic: Clinical - Prescription Issue >> Oct 19, 2023  8:31 AM Briana Parker wrote: Reason for CRM: Patient claims that the pharmacy has not received the prescription for guaiFENesin  200 MG tablet - patient was able to pick up the other two medications - Can we resend or call in the medication again to the pharmacy below:  [id number] - [place] ([place] - [address],  ([place]) [place] [address] Phone: [phone]  Fax: [phone]

## 2023-10-19 NOTE — Telephone Encounter (Signed)
 No lipid panel since 02/16/23

## 2023-10-19 NOTE — Telephone Encounter (Signed)
 r guaiFENesin  200 MG tablet  resent to [facility] - [place] ([place]), [place] - [address],  ([place]) [place] [address] Phone: [phone]  Fax: [phone]

## 2023-10-26 ENCOUNTER — Other Ambulatory Visit (INDEPENDENT_AMBULATORY_CARE_PROVIDER_SITE_OTHER): Payer: Self-pay | Admitting: Primary Care

## 2023-10-26 DIAGNOSIS — Z20828 Contact with and (suspected) exposure to other viral communicable diseases: Secondary | ICD-10-CM

## 2023-10-29 ENCOUNTER — Telehealth (INDEPENDENT_AMBULATORY_CARE_PROVIDER_SITE_OTHER): Payer: Self-pay | Admitting: Primary Care

## 2023-10-29 NOTE — Telephone Encounter (Signed)
Per [facility] agent called in and asked if the nurse or provider was available. Per nurse is not available at the time, pt stated that she did not want to leave information with me. Pt said she wanted to speak to the nurse and not myself. Pt wanted medications refilled (Atrovent and Proventil). Pt states that she tried to get these refilled but is getting denied.   Pt also wanted referral info and I was trying to provide pt with details on where referral is placed. She insisted on getting this information from the nurse and not myself. Pt was instructed that nurse may call but her message will get to the nurse.

## 2023-10-31 ENCOUNTER — Other Ambulatory Visit (INDEPENDENT_AMBULATORY_CARE_PROVIDER_SITE_OTHER): Payer: Self-pay | Admitting: Primary Care

## 2023-10-31 ENCOUNTER — Other Ambulatory Visit (INDEPENDENT_AMBULATORY_CARE_PROVIDER_SITE_OTHER): Payer: Self-pay

## 2023-10-31 ENCOUNTER — Encounter (INDEPENDENT_AMBULATORY_CARE_PROVIDER_SITE_OTHER): Payer: Self-pay | Admitting: Primary Care

## 2023-10-31 DIAGNOSIS — J45909 Unspecified asthma, uncomplicated: Secondary | ICD-10-CM

## 2023-10-31 DIAGNOSIS — Z760 Encounter for issue of repeat prescription: Secondary | ICD-10-CM

## 2023-10-31 MED ORDER — ALBUTEROL SULFATE (2.5 MG/3ML) 0.083% IN NEBU: 2.5000 mg | INHALATION_SOLUTION | RESPIRATORY_TRACT | 12 refills | Status: DC

## 2023-10-31 MED ORDER — ROSUVASTATIN CALCIUM 10 MG PO TABS: 10.0000 mg | ORAL_TABLET | Freq: Every day | ORAL | 0 refills | Status: DC

## 2023-10-31 NOTE — Telephone Encounter (Signed)
Returned pt call and sent in a refill for Proventil. Pt only question was about her cholesterol medication crestor. Pt states she doesn't have any refills on it and she was told by her last doctor to continue the medication even though her cholesterol is good. Was going to send in refill but got a allergy contraindication. Made pt aware that I will send message to provider and when I get a response I will reach out to her.

## 2023-11-13 ENCOUNTER — Other Ambulatory Visit (INDEPENDENT_AMBULATORY_CARE_PROVIDER_SITE_OTHER): Payer: Self-pay | Admitting: Primary Care

## 2023-11-13 DIAGNOSIS — J329 Chronic sinusitis, unspecified: Secondary | ICD-10-CM

## 2023-11-13 DIAGNOSIS — Z760 Encounter for issue of repeat prescription: Secondary | ICD-10-CM

## 2023-11-22 ENCOUNTER — Other Ambulatory Visit (INDEPENDENT_AMBULATORY_CARE_PROVIDER_SITE_OTHER): Payer: Self-pay | Admitting: Primary Care

## 2023-11-22 DIAGNOSIS — Z760 Encounter for issue of repeat prescription: Secondary | ICD-10-CM

## 2023-11-22 DIAGNOSIS — J45909 Unspecified asthma, uncomplicated: Secondary | ICD-10-CM

## 2023-12-22 ENCOUNTER — Other Ambulatory Visit (INDEPENDENT_AMBULATORY_CARE_PROVIDER_SITE_OTHER): Payer: Self-pay | Admitting: Primary Care

## 2023-12-22 DIAGNOSIS — Z760 Encounter for issue of repeat prescription: Secondary | ICD-10-CM

## 2023-12-22 DIAGNOSIS — J329 Chronic sinusitis, unspecified: Secondary | ICD-10-CM

## 2023-12-22 DIAGNOSIS — J45909 Unspecified asthma, uncomplicated: Secondary | ICD-10-CM

## 2023-12-24 NOTE — Telephone Encounter (Signed)
 Requested medication (s) are due for refill today: Yes  Requested medication (s) are on the active medication list: Yes (rosuvastatin), No (ventolin inhalation)  Last refill:  11/24/23 (Ventolin inhaler), 11/01/23 (Rosuvastatin)  Future visit scheduled: No  Notes to clinic:  Ventolin not on current medication list      Requested Prescriptions  Pending Prescriptions Disp Refills   VENTOLIN HFA 108 (90 Base) MCG/ACT inhaler [Pharmacy Med Name: Ventolin HFA 108 (90 Base) MCG/ACT Inhalation Aerosol Solution] 18 g 0    Sig: INHALE 2 PUFFS BY MOUTH 4 TIMES DAILY AS NEEDED FOR WHEEZING OR SHORTNESS OF BREATH     Pulmonology:  Beta Agonists 2 Passed - 12/24/2023  1:10 PM      Passed - Last BP in normal range    BP Readings from Last 1 Encounters:  07/01/23 117/78         Passed - Last Heart Rate in normal range    Pulse Readings from Last 1 Encounters:  07/01/23 85         Passed - Valid encounter within last 12 months    Recent Outpatient Visits           2 months ago Bronchospasm   [place] [facility] Marius Siemens, NP   5 months ago Asthma, exercise induced   [place] [facility] Marius Siemens, NP   10 months ago Encounter to establish care   [place] [facility] Marius Siemens, NP               rosuvastatin (CRESTOR) 10 MG tablet [Pharmacy Med Name: Rosuvastatin Calcium 10 MG Oral Tablet] 90 tablet 0    Sig: Take 1 tablet by mouth once daily     Cardiovascular:  Antilipid - Statins 2 Failed - 12/24/2023  1:10 PM      Failed - Lipid Panel in normal range within the last 12 months    Cholesterol, Total  Date Value Ref Range Status  02/16/2023 180 100 - 199 mg/dL Final   LDL Chol Calc (NIH)  Date Value Ref Range Status  02/16/2023 100 (H) 0 - 99 mg/dL Final   HDL  Date Value Ref Range Status  02/16/2023 61 >39 mg/dL Final   Triglycerides  Date Value Ref Range Status  02/16/2023 106 0 -  149 mg/dL Final         Passed - Cr in normal range and within 360 days    Creat  Date Value Ref Range Status  02/11/2015 0.77 0.50 - 1.10 mg/dL Final   Creatinine, Ser  Date Value Ref Range Status  02/16/2023 0.79 0.57 - 1.00 mg/dL Final         Passed - Patient is not pregnant      Passed - Valid encounter within last 12 months    Recent Outpatient Visits           2 months ago Bronchospasm   [place] [facility] Marius Siemens, NP   5 months ago Asthma, exercise induced   [place] [facility] Marius Siemens, NP   10 months ago Encounter to establish care   [place] [facility] Marius Siemens, NP

## 2023-12-28 ENCOUNTER — Other Ambulatory Visit (INDEPENDENT_AMBULATORY_CARE_PROVIDER_SITE_OTHER): Payer: Self-pay | Admitting: Primary Care

## 2024-01-09 ENCOUNTER — Ambulatory Visit: Admitting: Pulmonary Disease

## 2024-01-17 ENCOUNTER — Ambulatory Visit (HOSPITAL_BASED_OUTPATIENT_CLINIC_OR_DEPARTMENT_OTHER)

## 2024-01-17 ENCOUNTER — Other Ambulatory Visit (INDEPENDENT_AMBULATORY_CARE_PROVIDER_SITE_OTHER): Payer: Self-pay | Admitting: Primary Care

## 2024-01-17 DIAGNOSIS — E782 Mixed hyperlipidemia: Secondary | ICD-10-CM | POA: Diagnosis not present

## 2024-01-17 DIAGNOSIS — Z131 Encounter for screening for diabetes mellitus: Secondary | ICD-10-CM | POA: Diagnosis not present

## 2024-01-17 DIAGNOSIS — R7303 Prediabetes: Secondary | ICD-10-CM | POA: Diagnosis not present

## 2024-01-17 DIAGNOSIS — E559 Vitamin D deficiency, unspecified: Secondary | ICD-10-CM | POA: Diagnosis not present

## 2024-01-17 NOTE — Progress Notes (Signed)
 Pt came into the office for lab work

## 2024-01-18 ENCOUNTER — Telehealth (INDEPENDENT_AMBULATORY_CARE_PROVIDER_SITE_OTHER): Payer: Self-pay

## 2024-01-18 ENCOUNTER — Other Ambulatory Visit (INDEPENDENT_AMBULATORY_CARE_PROVIDER_SITE_OTHER): Payer: Self-pay

## 2024-01-18 LAB — CBC WITH DIFFERENTIAL/PLATELET
Basophils Absolute: 0 10*3/uL (ref 0.0–0.2)
Basos: 1 %
EOS (ABSOLUTE): 0 10*3/uL (ref 0.0–0.4)
Eos: 1 %
Hematocrit: 39.6 % (ref 34.0–46.6)
Hemoglobin: 13.2 g/dL (ref 11.1–15.9)
Immature Grans (Abs): 0 10*3/uL (ref 0.0–0.1)
Immature Granulocytes: 0 %
Lymphocytes Absolute: 1.9 10*3/uL (ref 0.7–3.1)
Lymphs: 50 %
MCH: 29.2 pg (ref 26.6–33.0)
MCHC: 33.3 g/dL (ref 31.5–35.7)
MCV: 88 fL (ref 79–97)
Monocytes Absolute: 0.3 10*3/uL (ref 0.1–0.9)
Monocytes: 8 %
Neutrophils Absolute: 1.5 10*3/uL (ref 1.4–7.0)
Neutrophils: 40 %
Platelets: 270 10*3/uL (ref 150–450)
RBC: 4.52 x10E6/uL (ref 3.77–5.28)
RDW: 11.8 % (ref 11.7–15.4)
WBC: 3.7 10*3/uL (ref 3.4–10.8)

## 2024-01-18 LAB — HEMOGLOBIN A1C
Est. average glucose Bld gHb Est-mCnc: 128 mg/dL
Hgb A1c MFr Bld: 6.1 % — ABNORMAL HIGH (ref 4.8–5.6)

## 2024-01-18 LAB — CMP14+EGFR
ALT: 12 IU/L (ref 0–32)
AST: 17 IU/L (ref 0–40)
Albumin: 4.5 g/dL (ref 3.8–4.9)
Alkaline Phosphatase: 91 IU/L (ref 44–121)
BUN/Creatinine Ratio: 21 (ref 12–28)
BUN: 16 mg/dL (ref 8–27)
Bilirubin Total: 0.3 mg/dL (ref 0.0–1.2)
CO2: 23 mmol/L (ref 20–29)
Calcium: 9.4 mg/dL (ref 8.7–10.3)
Chloride: 104 mmol/L (ref 96–106)
Creatinine, Ser: 0.76 mg/dL (ref 0.57–1.00)
Globulin, Total: 2.3 g/dL (ref 1.5–4.5)
Glucose: 85 mg/dL (ref 70–99)
Potassium: 3.9 mmol/L (ref 3.5–5.2)
Sodium: 140 mmol/L (ref 134–144)
Total Protein: 6.8 g/dL (ref 6.0–8.5)
eGFR: 90 mL/min/{1.73_m2} (ref >59)

## 2024-01-18 LAB — LIPID PANEL
Chol/HDL Ratio: 2.8 ratio (ref 0.0–4.4)
Cholesterol, Total: 161 mg/dL (ref 100–199)
HDL: 57 mg/dL (ref >39)
LDL Chol Calc (NIH): 86 mg/dL (ref 0–99)
Triglycerides: 96 mg/dL (ref 0–149)
VLDL Cholesterol Cal: 18 mg/dL (ref 5–40)

## 2024-01-18 LAB — VITAMIN D 25 HYDROXY (VIT D DEFICIENCY, FRACTURES): Vit D, 25-Hydroxy: 28.4 ng/mL — ABNORMAL LOW (ref 30.0–100.0)

## 2024-01-18 MED ORDER — OMEPRAZOLE 40 MG PO CPDR: DELAYED_RELEASE_CAPSULE | ORAL | 1 refills | Status: DC

## 2024-01-18 NOTE — Telephone Encounter (Unsigned)
 Copied from CRM [id number]. Topic: General - Billing Inquiry >> Jan 18, 2024  3:29 PM Elle L wrote: Reason for CRM: The patient states she paid her balance during her appointment but it still shows that she has a balance on MyChart. I offered to transfer her to billing but she states she spoke to the front desk about this during her appointment and was advised that it should not show up after it is paid. The patient's call back number is [phone].

## 2024-01-18 NOTE — Telephone Encounter (Signed)
 Will reach out to pt once provider see labs and results them

## 2024-01-22 ENCOUNTER — Ambulatory Visit (INDEPENDENT_AMBULATORY_CARE_PROVIDER_SITE_OTHER): Payer: Self-pay | Admitting: Primary Care

## 2024-01-24 ENCOUNTER — Other Ambulatory Visit (INDEPENDENT_AMBULATORY_CARE_PROVIDER_SITE_OTHER): Payer: Self-pay

## 2024-02-12 ENCOUNTER — Other Ambulatory Visit (INDEPENDENT_AMBULATORY_CARE_PROVIDER_SITE_OTHER): Payer: Self-pay | Admitting: Primary Care

## 2024-02-12 DIAGNOSIS — J329 Chronic sinusitis, unspecified: Secondary | ICD-10-CM

## 2024-02-12 DIAGNOSIS — J45909 Unspecified asthma, uncomplicated: Secondary | ICD-10-CM

## 2024-02-12 DIAGNOSIS — Z760 Encounter for issue of repeat prescription: Secondary | ICD-10-CM

## 2024-02-19 ENCOUNTER — Other Ambulatory Visit: Payer: Self-pay | Admitting: Primary Care

## 2024-02-19 DIAGNOSIS — Z1231 Encounter for screening mammogram for malignant neoplasm of breast: Secondary | ICD-10-CM

## 2024-02-27 ENCOUNTER — Other Ambulatory Visit: Payer: Self-pay | Admitting: Primary Care

## 2024-02-27 DIAGNOSIS — Z1231 Encounter for screening mammogram for malignant neoplasm of breast: Secondary | ICD-10-CM

## 2024-02-29 ENCOUNTER — Encounter

## 2024-02-29 DIAGNOSIS — Z1231 Encounter for screening mammogram for malignant neoplasm of breast: Secondary | ICD-10-CM

## 2024-03-04 ENCOUNTER — Other Ambulatory Visit (INDEPENDENT_AMBULATORY_CARE_PROVIDER_SITE_OTHER): Payer: Self-pay | Admitting: Primary Care

## 2024-03-04 DIAGNOSIS — J329 Chronic sinusitis, unspecified: Secondary | ICD-10-CM

## 2024-03-04 DIAGNOSIS — Z760 Encounter for issue of repeat prescription: Secondary | ICD-10-CM

## 2024-03-21 ENCOUNTER — Other Ambulatory Visit (INDEPENDENT_AMBULATORY_CARE_PROVIDER_SITE_OTHER): Payer: Self-pay | Admitting: Primary Care

## 2024-03-21 ENCOUNTER — Other Ambulatory Visit: Payer: Self-pay | Admitting: Primary Care

## 2024-03-21 DIAGNOSIS — J329 Chronic sinusitis, unspecified: Secondary | ICD-10-CM

## 2024-03-21 DIAGNOSIS — J45909 Unspecified asthma, uncomplicated: Secondary | ICD-10-CM

## 2024-03-21 DIAGNOSIS — J441 Chronic obstructive pulmonary disease with (acute) exacerbation: Secondary | ICD-10-CM

## 2024-03-21 DIAGNOSIS — Z1231 Encounter for screening mammogram for malignant neoplasm of breast: Secondary | ICD-10-CM

## 2024-03-21 DIAGNOSIS — Z760 Encounter for issue of repeat prescription: Secondary | ICD-10-CM

## 2024-03-24 NOTE — Telephone Encounter (Signed)
 Requested Prescriptions  Pending Prescriptions Disp Refills   DULERA  100-5 MCG/ACT AERO [Pharmacy Med Name: Dulera  100-5 MCG/ACT Inhalation Aerosol] 13 g 0    Sig: Inhale 2 puffs by mouth twice daily     Pulmonology:  Combination Products Passed - 03/24/2024  2:14 PM      Passed - Valid encounter within last 12 months    Recent Outpatient Visits           5 months ago Bronchospasm   [place] [facility] Celestia Rosaline SQUIBB, NP   8 months ago Asthma, exercise induced   [place] [facility] Celestia Rosaline SQUIBB, NP   1 year ago Encounter to establish care   [place] [facility] Celestia Rosaline SQUIBB, NP               fluticasone  (FLONASE ) 50 MCG/ACT nasal spray [Pharmacy Med Name: Fluticasone  Propionate 50 MCG/ACT Nasal Suspension] 16 g 0    Sig: Use 1 spray(s) in each nostril once daily     Ear, Nose, and Throat: Nasal Preparations - Corticosteroids Passed - 03/24/2024  2:14 PM      Passed - Valid encounter within last 12 months    Recent Outpatient Visits           5 months ago Bronchospasm   [place] [facility] Celestia Rosaline SQUIBB, NP   8 months ago Asthma, exercise induced   [place] [facility] Celestia Rosaline SQUIBB, NP   1 year ago Encounter to establish care   [place] [facility] Celestia Rosaline SQUIBB, NP               VENTOLIN  HFA 108 (90 Base) MCG/ACT inhaler [Pharmacy Med Name: Ventolin  HFA 108 (90 Base) MCG/ACT Inhalation Aerosol Solution] 18 g 0    Sig: INHALE 2 PUFFS BY MOUTH 4 TIMES DAILY AS NEEDED FOR WHEEZING FOR SHORTNESS OF BREATH     Pulmonology:  Beta Agonists 2 Passed - 03/24/2024  2:14 PM      Passed - Last BP in normal range    BP Readings from Last 1 Encounters:  07/01/23 117/78         Passed - Last Heart Rate in normal range    Pulse Readings from Last 1 Encounters:  07/01/23 85         Passed - Valid encounter within last 12  months    Recent Outpatient Visits           5 months ago Bronchospasm   [place] [facility] Celestia Rosaline SQUIBB, NP   8 months ago Asthma, exercise induced   [place] [facility] Celestia Rosaline SQUIBB, NP   1 year ago Encounter to establish care   [place] [facility] Celestia Rosaline SQUIBB, NP               baclofen  (LIORESAL ) 10 MG tablet [Pharmacy Med Name: Baclofen  10 MG Oral Tablet] 30 tablet     Sig: TAKE 1 TABLET BY MOUTH THREE TIMES DAILY     Analgesics:  Muscle Relaxants - baclofen  Failed - 03/24/2024  2:14 PM      Failed - Valid encounter within last 6 months    Recent Outpatient Visits           5 months ago Bronchospasm   [place] [facility] Celestia Rosaline SQUIBB, NP   8 months ago Asthma, exercise induced   [facility] Celestia Rosaline SQUIBB, NP   1 year ago Encounter to establish care   [place] [facility] Celestia Rosaline SQUIBB, NP              Passed - Cr in normal range and within 180 days    Creat  Date Value Ref Range Status  02/11/2015 0.77 0.50 - 1.10 mg/dL Final   Creatinine, Ser  Date Value Ref Range Status  01/17/2024 0.76 0.57 - 1.00 mg/dL Final         Passed - eGFR is 30 or above and within 180 days    GFR, Est African American  Date Value Ref Range Status  02/11/2015 >89 mL/min Final   GFR calc Af Amer  Date Value Ref Range Status  07/19/2017 86 >59 mL/min/1.73 Final   GFR, Est Non African American  Date Value Ref Range Status  02/11/2015 >89 mL/min Final    Comment:      The estimated GFR is a calculation valid for adults (>=42 years old) that uses the CKD-EPI algorithm to adjust for age and sex. It is   not to be used for children, pregnant women, hospitalized patients,    patients on dialysis, or with rapidly changing kidney function. According to the NKDEP, eGFR >89 is normal, 60-89 shows mild impairment,  30-59 shows moderate impairment, 15-29 shows severe impairment and <15 is ESRD.      GFR calc non Af Amer  Date Value Ref Range Status  07/19/2017 74 >59 mL/min/1.73 Final   eGFR  Date Value Ref Range Status  01/17/2024 90 >59 mL/min/1.73 Final

## 2024-03-24 NOTE — Telephone Encounter (Signed)
 Requested medication (s) are due for refill today: yes  Requested medication (s) are on the active medication list: yes  Last refill:  02/13/24 #90  Future visit scheduled: no  Notes to clinic:  sent pt MyChart message to call to set up appt for medication refills. Pt already received 30 day fill.   Requested Prescriptions  Pending Prescriptions Disp Refills   baclofen  (LIORESAL ) 10 MG tablet [Pharmacy Med Name: Baclofen  10 MG Oral Tablet] 30 tablet     Sig: TAKE 1 TABLET BY MOUTH THREE TIMES DAILY     Analgesics:  Muscle Relaxants - baclofen  Failed - 03/24/2024  2:14 PM      Failed - Valid encounter within last 6 months    Recent Outpatient Visits           5 months ago Bronchospasm   [place] [facility] Celestia Rosaline SQUIBB, NP   8 months ago Asthma, exercise induced   [place] [facility] Celestia Rosaline SQUIBB, NP   1 year ago Encounter to establish care   [place] [facility] Celestia Rosaline SQUIBB, NP              Passed - Cr in normal range and within 180 days    Creat  Date Value Ref Range Status  02/11/2015 0.77 0.50 - 1.10 mg/dL Final   Creatinine, Ser  Date Value Ref Range Status  01/17/2024 0.76 0.57 - 1.00 mg/dL Final         Passed - eGFR is 30 or above and within 180 days    GFR, Est African American  Date Value Ref Range Status  02/11/2015 >89 mL/min Final   GFR calc Af Amer  Date Value Ref Range Status  07/19/2017 86 >59 mL/min/1.73 Final   GFR, Est Non African American  Date Value Ref Range Status  02/11/2015 >89 mL/min Final    Comment:      The estimated GFR is a calculation valid for adults (>=60 years old) that uses the CKD-EPI algorithm to adjust for age and sex. It is   not to be used for children, pregnant women, hospitalized patients,    patients on dialysis, or with rapidly changing kidney function. According to the NKDEP, eGFR >89 is normal, 60-89 shows mild impairment, 30-59  shows moderate impairment, 15-29 shows severe impairment and <15 is ESRD.      GFR calc non Af Amer  Date Value Ref Range Status  07/19/2017 74 >59 mL/min/1.73 Final   eGFR  Date Value Ref Range Status  01/17/2024 90 >59 mL/min/1.73 Final         Signed Prescriptions Disp Refills   DULERA  100-5 MCG/ACT AERO 13 g 0    Sig: Inhale 2 puffs by mouth twice daily     Pulmonology:  Combination Products Passed - 03/24/2024  2:14 PM      Passed - Valid encounter within last 12 months    Recent Outpatient Visits           5 months ago Bronchospasm   [place] [facility] Celestia Rosaline SQUIBB, NP   8 months ago Asthma, exercise induced   [place] [facility] Celestia Rosaline SQUIBB, NP   1 year ago Encounter to establish care   [place] [facility] Celestia Rosaline SQUIBB, NP               fluticasone  (FLONASE ) 50 MCG/ACT nasal spray 16 g 0  Sig: Use 1 spray(s) in each nostril once daily     Ear, Nose, and Throat: Nasal Preparations - Corticosteroids Passed - 03/24/2024  2:14 PM      Passed - Valid encounter within last 12 months    Recent Outpatient Visits           5 months ago Bronchospasm   [place] [facility] Celestia Rosaline SQUIBB, NP   8 months ago Asthma, exercise induced   [place] [facility] Celestia Rosaline SQUIBB, NP   1 year ago Encounter to establish care   [place] [facility] Celestia Rosaline SQUIBB, NP               VENTOLIN  HFA 108 (90 Base) MCG/ACT inhaler 18 g 0    Sig: INHALE 2 PUFFS BY MOUTH 4 TIMES DAILY AS NEEDED FOR WHEEZING FOR SHORTNESS OF BREATH     Pulmonology:  Beta Agonists 2 Passed - 03/24/2024  2:14 PM      Passed - Last BP in normal range    BP Readings from Last 1 Encounters:  07/01/23 117/78         Passed - Last Heart Rate in normal range    Pulse Readings from Last 1 Encounters:  07/01/23 85         Passed - Valid encounter  within last 12 months    Recent Outpatient Visits           5 months ago Bronchospasm   [place] [facility] Celestia Rosaline SQUIBB, NP   8 months ago Asthma, exercise induced   [place] [facility] Celestia Rosaline SQUIBB, NP   1 year ago Encounter to establish care   [place] [facility] Celestia Rosaline SQUIBB, NP

## 2024-03-25 NOTE — Telephone Encounter (Signed)
 Will forward to provider

## 2024-03-28 ENCOUNTER — Ambulatory Visit (INDEPENDENT_AMBULATORY_CARE_PROVIDER_SITE_OTHER): Admitting: Pulmonary Disease

## 2024-03-28 ENCOUNTER — Encounter: Payer: Self-pay | Admitting: Pulmonary Disease

## 2024-03-28 ENCOUNTER — Other Ambulatory Visit (INDEPENDENT_AMBULATORY_CARE_PROVIDER_SITE_OTHER): Payer: Self-pay | Admitting: Primary Care

## 2024-03-28 VITALS — BP 124/54 | HR 88 | Ht 62.0 in | Wt 142.4 lb

## 2024-03-28 DIAGNOSIS — R053 Chronic cough: Secondary | ICD-10-CM | POA: Diagnosis not present

## 2024-03-28 DIAGNOSIS — J4540 Moderate persistent asthma, uncomplicated: Secondary | ICD-10-CM | POA: Diagnosis not present

## 2024-03-28 DIAGNOSIS — R079 Chest pain, unspecified: Secondary | ICD-10-CM | POA: Diagnosis not present

## 2024-03-28 DIAGNOSIS — J324 Chronic pansinusitis: Secondary | ICD-10-CM | POA: Diagnosis not present

## 2024-03-28 MED ORDER — IPRATROPIUM BROMIDE 0.03 % NA SOLN: 2.0000 | Freq: Two times a day (BID) | NASAL | 12 refills | Status: AC

## 2024-03-28 MED ORDER — IPRATROPIUM-ALBUTEROL 0.5-2.5 (3) MG/3ML IN SOLN
3.0000 mL | RESPIRATORY_TRACT | 5 refills | Status: DC | PRN
Start: 2024-03-28 — End: 2024-05-26

## 2024-03-28 NOTE — Progress Notes (Signed)
 Synopsis: Referred in July 2025 for COPD  Subjective:   PATIENT ID: Briana Parker GENDER: female DOB: 07-Sep-1966, MRN: [id number]   HPI  Chief Complaint  Patient presents with   Consult    Referred for asthma and SOB. SOB is only when coughing.    Briana Parker is a 58 year old woman, never smoker with history of GERD, chronic pansinusitis and asthma who is referred to pulmonary clinic.  Patient has asthma and chronic bronchitis, using albuterol  nebulizer and Dulera  inhaler. She feels the nebulizer dose is insufficient. Despite these treatments, she experiences persistent respiratory symptoms and central chest pain, especially after coughing, since a severe COVID-19 infection in 2020.  Her chest pain worsens with coughing. Every cold progresses to her chest, causing difficulty in breathing and chest discomfort. Wheezing occurs with excessive coughing. Sinus issues lead to frequent colds, sinus congestion, and nighttime coughing with thick, clear mucus. She uses Flonase  nasal spray and an allergy pill, cetirizine . Earaches and eye pressure accompany her sinus problems.  She has a history of secondhand smoke exposure from her parents, who were smokers, and has had asthma since childhood. She maintains an active lifestyle, working out three days a week and caring for children, which she believes contributes to her frequent illnesses.  Past Medical History:  Diagnosis Date   Allergic rhinitis    Allergy    seasonal   Anxiety    Arthritis    back   Chronic bronchitis (HCC)    COPD (chronic obstructive pulmonary disease) (HCC)    Depression    Fibromyalgia    GERD (gastroesophageal reflux disease)    Hyperlipidemia    Low back pain      Family History  Problem Relation Age of Onset   Diabetes Mother        died recently in 02/24/10 due to 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  She was on dialysis and died with cardiac arrest as per pt.   Heart disease Father    Hypertension Father     Coronary artery disease Other        Strong family history in first degree relatives   Colon cancer Neg Hx      Social History   Socioeconomic History   Marital status: Married    Spouse name: Not on file   Number of children: Not on file   Years of education: Not on file   Highest education level: Not on file  Occupational History   Not on file  Tobacco Use   Smoking status: Never   Smokeless tobacco: Never  Substance and Sexual Activity   Alcohol use: No    Alcohol/week: 0.0 standard drinks of alcohol   Drug use: No   Sexual activity: Yes  Other Topics Concern   Not on file  Social History Narrative   Exercises Regularly, currently takes care of one child.  Lives at home with daughter.   Social Drivers of Corporate investment banker Strain: Not on file  Food Insecurity: Not on file  Transportation Needs: Not on file  Physical Activity: Not on file  Stress: Not on file  Social Connections: Not on file  Intimate Partner Violence: Not on file     Allergies  Allergen Reactions   Augmentin  [Amoxicillin -Pot Clavulanate] Diarrhea   Codeine      REACTION: unknown reaction   Hydrocodone    Ibuprofen  Swelling   Nortriptyline      Nightmares   Pravastatin Sodium     REACTION: Pruritis  and muscle pain   Simvastatin     REACTION: Abdominal pain, nausea, vomitting     Outpatient Medications Prior to Visit  Medication Sig Dispense Refill   baclofen  (LIORESAL ) 10 MG tablet TAKE 1 TABLET BY MOUTH THREE TIMES DAILY 30 tablet 0   cetirizine  (ZYRTEC ) 10 MG tablet Take 1 tablet (10 mg total) by mouth daily. 90 tablet 1   DULERA  100-5 MCG/ACT AERO Inhale 2 puffs by mouth twice daily 13 g 0   fluticasone  (FLONASE ) 50 MCG/ACT nasal spray Use 1 spray(s) in each nostril once daily 16 g 0   Misc. Devices MISC Nebulizer Mask for nebulizer machine 1 each 0   omeprazole  (PRILOSEC) 40 MG capsule TAKE AS NEEDED FOR ACID REFLUX. THIS IS NOT A DAILY MEDICATION. 90 capsule 1   VENTOLIN  HFA  108 (90 Base) MCG/ACT inhaler INHALE 2 PUFFS BY MOUTH 4 TIMES DAILY AS NEEDED FOR WHEEZING FOR SHORTNESS OF BREATH 18 g 0   albuterol  (PROVENTIL ) (2.5 MG/3ML) 0.083% nebulizer solution Take 3 mLs (2.5 mg total) by nebulization every 6 hours as needed for wheezing or shortness of breath. 75 mL 12   ipratropium (ATROVENT ) 0.02 % nebulizer solution Take 2.5 mLs (0.5 mg total) by nebulization 4 (four) times daily. 75 mL 12   guaiFENesin  200 MG tablet Take 1 tablet (200 mg total) by mouth every 4 (four) hours as needed for cough or to loosen phlegm. 30 tablet 1   rosuvastatin  (CRESTOR ) 10 MG tablet Take 1 tablet (10 mg total) by mouth daily. 90 tablet 0   No facility-administered medications prior to visit.    Review of Systems  Constitutional:  Negative for chills, fever, malaise/fatigue and weight loss.  HENT:  Positive for congestion, ear pain and sinus pain. Negative for sore throat.   Eyes: Negative.   Respiratory:  Positive for cough, sputum production, shortness of breath and wheezing. Negative for hemoptysis.   Cardiovascular:  Negative for chest pain, palpitations, orthopnea, claudication and leg swelling.  Gastrointestinal:  Negative for abdominal pain, heartburn, nausea and vomiting.  Genitourinary: Negative.   Musculoskeletal:  Negative for joint pain and myalgias.  Skin:  Negative for rash.  Neurological:  Negative for weakness.  Endo/Heme/Allergies: Negative.   Psychiatric/Behavioral: Negative.     Objective:   Vitals:   03/28/24 0935 03/28/24 0936  BP: (!) 143/87 (!) 124/54  Pulse: 88   SpO2: 98%   Weight: 142 lb 6.4 oz (64.6 kg)   Height: 5' 2 (1.575 m)    Physical Exam Constitutional:      General: She is not in acute distress.    Appearance: Normal appearance.  Eyes:     General: No scleral icterus.    Conjunctiva/sclera: Conjunctivae normal.  Cardiovascular:     Rate and Rhythm: Normal rate and regular rhythm.  Pulmonary:     Breath sounds: No wheezing,  rhonchi or rales.  Musculoskeletal:     Right lower leg: No edema.     Left lower leg: No edema.  Skin:    General: Skin is warm and dry.  Neurological:     General: No focal deficit present.    CBC    Component Value Date/Time   WBC 3.7 01/17/2024 1021   WBC 4.4 12/15/2015 1131   RBC 4.52 01/17/2024 1021   RBC 4.60 12/15/2015 1131   HGB 13.2 01/17/2024 1021   HCT 39.6 01/17/2024 1021   PLT 270 01/17/2024 1021   MCV 88 01/17/2024 1021   MCH 29.2  01/17/2024 1021   MCH 27.8 12/15/2015 1131   MCHC 33.3 01/17/2024 1021   MCHC 32.1 12/15/2015 1131   RDW 11.8 01/17/2024 1021   LYMPHSABS 1.9 01/17/2024 1021   MONOABS 0.4 10/11/2007 2028   EOSABS 0.0 01/17/2024 1021   BASOSABS 0.0 01/17/2024 1021      Latest Ref Rng & Units 01/17/2024   10:21 AM 02/16/2023   10:03 AM 04/19/2021   11:27 AM  BMP  Glucose 70 - 99 mg/dL 85  81  90   BUN 8 - 27 mg/dL 16  11  11    Creatinine 0.57 - 1.00 mg/dL 9.23  9.20  9.21   BUN/Creat Ratio 12 - 28 21  14  14    Sodium 134 - 144 mmol/L 140  139  141   Potassium 3.5 - 5.2 mmol/L 3.9  4.7  4.0   Chloride 96 - 106 mmol/L 104  102  103   CO2 20 - 29 mmol/L 23  21  24    Calcium  8.7 - 10.3 mg/dL 9.4  9.6  9.5    Chest imaging:  PFT:     No data to display          Labs:  Path:  Echo:  Heart Catheterization:    Assessment & Plan:   Moderate persistent asthma without complication - Plan: Pulmonary Function Test, ipratropium-albuterol  (DUONEB) 0.5-2.5 (3) MG/3ML SOLN  Chronic pansinusitis - Plan: CT Maxillofacial LTD WO CM, ipratropium (ATROVENT ) 0.03 % nasal spray  Chronic cough - Plan: CT CHEST HIGH RESOLUTION  Discussion: Maegan Buller is a 58 year old woman, never smoker with history of GERD, chronic pansinusitis and asthma who is referred to pulmonary clinic.  Asthma Chronic asthma with inadequate control.  - Increase Dulera  to two puffs twice daily. - Prescribe DuoNeb nebulizer solution every 4-6 hours as needed. -  Schedule breathing test to assess lung function. - Order CT scan of the lungs to evaluate for post-COVID changes or scarring. - Instruct to use albuterol  rescue inhaler as needed.  Chronic sinusitis Chronic sinusitis with sinus drainage, earaches, and eye pressure. Symptoms worsen with colds. No prior ENT evaluation or recent sinus imaging. - Order CT scan of the sinuses. - Prescribe ipratropium nasal spray, two sprays per nostril morning and evening. - Continue Flonase  nasal spray, one spray per nostril daily. - Consider ENT referral based on CT findings.  Musculoskeletal chest pain Musculoskeletal chest pain likely due to coughing and physical activity. No cardiac involvement suspected. - Recommend Tylenol  for pain management.  Follow up in 2 months  Dorn Chill, MD [facility] Office: [phone]    Current Outpatient Medications:    baclofen  (LIORESAL ) 10 MG tablet, TAKE 1 TABLET BY MOUTH THREE TIMES DAILY, Disp: 30 tablet, Rfl: 0   cetirizine  (ZYRTEC ) 10 MG tablet, Take 1 tablet (10 mg total) by mouth daily., Disp: 90 tablet, Rfl: 1   DULERA  100-5 MCG/ACT AERO, Inhale 2 puffs by mouth twice daily, Disp: 13 g, Rfl: 0   fluticasone  (FLONASE ) 50 MCG/ACT nasal spray, Use 1 spray(s) in each nostril once daily, Disp: 16 g, Rfl: 0   ipratropium (ATROVENT ) 0.03 % nasal spray, Place 2 sprays into both nostrils every 12 (twelve) hours., Disp: 30 mL, Rfl: 12   ipratropium-albuterol  (DUONEB) 0.5-2.5 (3) MG/3ML SOLN, Take 3 mLs by nebulization every 4 (four) hours as needed., Disp: 360 mL, Rfl: 5   Misc. Devices MISC, Nebulizer Mask for nebulizer machine, Disp: 1 each, Rfl: 0  omeprazole  (PRILOSEC) 40 MG capsule, TAKE AS NEEDED FOR ACID REFLUX. THIS IS NOT A DAILY MEDICATION., Disp: 90 capsule, Rfl: 1   VENTOLIN  HFA 108 (90 Base) MCG/ACT inhaler, INHALE 2 PUFFS BY MOUTH 4 TIMES DAILY AS NEEDED FOR WHEEZING FOR SHORTNESS OF BREATH, Disp: 18 g, Rfl: 0

## 2024-03-28 NOTE — Patient Instructions (Addendum)
 Use dulera  100-5mcg 2 puffs twice daily - rinse mouth out after each use  Use duoneb nebulizer solution every 4-6 hours as needed - try using before taking your dulera  inhaler  Use albuterol  inhaler 1-2 puffs every 4-6 hours as needed  Continue flonase  nasal spray, 1 spray per nostril daily  Start ipratropium nasal spray, 2 sprays per nostril twice daily  We will schedule you for a CT scan of your sinuses and consider referral to ENT  We will schedule you for CT Scan of the lungs to evaluate for post-covid scarring  Schedule pulmonary function tests at front desk  Follow up in 2 months to review breathing tests and CT scans

## 2024-04-04 ENCOUNTER — Encounter

## 2024-04-04 ENCOUNTER — Ambulatory Visit
Admission: RE | Admit: 2024-04-04 | Discharge: 2024-04-04 | Disposition: A | Discharge status: Home / Self Care | Source: Ambulatory Visit | Attending: Primary Care | Admitting: Primary Care

## 2024-04-04 ENCOUNTER — Other Ambulatory Visit: Payer: Self-pay | Admitting: Physical Medicine and Rehabilitation

## 2024-04-04 ENCOUNTER — Telehealth: Payer: Self-pay | Admitting: Physical Medicine and Rehabilitation

## 2024-04-04 DIAGNOSIS — Z1231 Encounter for screening mammogram for malignant neoplasm of breast: Secondary | ICD-10-CM | POA: Diagnosis not present

## 2024-04-04 DIAGNOSIS — M5416 Radiculopathy, lumbar region: Secondary | ICD-10-CM

## 2024-04-04 NOTE — Telephone Encounter (Signed)
 Patient called and wants to get another injection. CB#[phone]

## 2024-04-11 ENCOUNTER — Ambulatory Visit (HOSPITAL_BASED_OUTPATIENT_CLINIC_OR_DEPARTMENT_OTHER)

## 2024-04-13 ENCOUNTER — Ambulatory Visit (HOSPITAL_BASED_OUTPATIENT_CLINIC_OR_DEPARTMENT_OTHER)
Admission: RE | Admit: 2024-04-13 | Discharge: 2024-04-13 | Disposition: A | Discharge status: Home / Self Care | Source: Ambulatory Visit | Attending: Pulmonary Disease | Admitting: Pulmonary Disease

## 2024-04-13 ENCOUNTER — Ambulatory Visit (HOSPITAL_BASED_OUTPATIENT_CLINIC_OR_DEPARTMENT_OTHER)
Admission: RE | Admit: 2024-04-13 | Discharge: 2024-04-13 | Disposition: A | Discharge status: Home / Self Care | Source: Ambulatory Visit | Attending: Pulmonary Disease

## 2024-04-13 DIAGNOSIS — R918 Other nonspecific abnormal finding of lung field: Secondary | ICD-10-CM | POA: Diagnosis not present

## 2024-04-13 DIAGNOSIS — J849 Interstitial pulmonary disease, unspecified: Secondary | ICD-10-CM | POA: Diagnosis not present

## 2024-04-13 DIAGNOSIS — R220 Localized swelling, mass and lump, head: Secondary | ICD-10-CM | POA: Diagnosis not present

## 2024-04-13 DIAGNOSIS — J324 Chronic pansinusitis: Secondary | ICD-10-CM | POA: Diagnosis not present

## 2024-04-13 DIAGNOSIS — R053 Chronic cough: Secondary | ICD-10-CM

## 2024-04-13 DIAGNOSIS — J984 Other disorders of lung: Secondary | ICD-10-CM | POA: Diagnosis not present

## 2024-04-14 ENCOUNTER — Other Ambulatory Visit (INDEPENDENT_AMBULATORY_CARE_PROVIDER_SITE_OTHER): Payer: Self-pay | Admitting: Primary Care

## 2024-04-14 DIAGNOSIS — J45909 Unspecified asthma, uncomplicated: Secondary | ICD-10-CM

## 2024-04-14 DIAGNOSIS — Z760 Encounter for issue of repeat prescription: Secondary | ICD-10-CM

## 2024-04-15 ENCOUNTER — Telehealth (INDEPENDENT_AMBULATORY_CARE_PROVIDER_SITE_OTHER): Payer: Self-pay

## 2024-04-15 NOTE — Telephone Encounter (Signed)
 Copied from CRM #[id number]. Topic: Referral - Request for Referral >> Apr 15, 2024  8:44 AM Emylou G wrote: Did the patient discuss referral with their provider in the last year? Yes (If No - schedule appointment) (If Yes - send message)  Appointment offered? No  Type of order/referral and detailed reason for visit: need referral for hand.. knot on hand is getting bigger.. AND need ent referral.. still has ringing in her - Prefers appts on Friday  ( Can you suggest eye doctor? - she is trying to find one - not for glasses but to check her vision )  Preference of office, provider, location:   If referral order, have you been seen by this specialty before? Yes (If Yes, this issue or another issue? When? Where?  Can we respond through MyChart? Yes

## 2024-04-21 ENCOUNTER — Ambulatory Visit: Payer: Self-pay | Admitting: Pulmonary Disease

## 2024-04-25 ENCOUNTER — Other Ambulatory Visit (HOSPITAL_BASED_OUTPATIENT_CLINIC_OR_DEPARTMENT_OTHER)

## 2024-04-28 ENCOUNTER — Telehealth: Payer: Self-pay

## 2024-04-28 NOTE — Telephone Encounter (Signed)
 Pt called to cancel her appointment for 05/01/24 with Dr. Eldonna due to family and personal issues

## 2024-05-01 ENCOUNTER — Encounter: Admitting: Physical Medicine and Rehabilitation

## 2024-05-14 ENCOUNTER — Telehealth: Payer: Self-pay

## 2024-05-14 NOTE — Telephone Encounter (Signed)
 Copied from CRM [id number]. Topic: Referral - Status >> May 13, 2024 10:21 AM Nathanel BROCKS wrote: Reason for CRM: pt is following up on the refferal for the ENT dr so that she can get an appt scheduled asap. She stated that she has already requested this twice and still has not heard anything from anyone, please advise.  Also she is need a referral to get a cyst taken out of her hand that has been there for quite some time. Pt states that they usually give her shots but they are no longer working any more and she needs to just have it removed. I advised pt that I would send this to the nurses station and that someone would follow up with her. Please call pt to follow up.

## 2024-05-15 ENCOUNTER — Telehealth: Payer: Self-pay | Admitting: Physical Medicine and Rehabilitation

## 2024-05-15 NOTE — Telephone Encounter (Signed)
 Pt called stating they need to cancel appt with [facility]. Pt states they are feeling better and appt not needed. No call back needed just cancel appt.

## 2024-05-22 ENCOUNTER — Encounter: Admitting: Physical Medicine and Rehabilitation

## 2024-05-26 ENCOUNTER — Other Ambulatory Visit: Payer: Self-pay | Admitting: Pulmonary Disease

## 2024-05-26 DIAGNOSIS — J4540 Moderate persistent asthma, uncomplicated: Secondary | ICD-10-CM

## 2024-05-26 NOTE — Telephone Encounter (Unsigned)
 Copied from CRM [id number]. Topic: Clinical - Medication Refill >> May 26, 2024  4:17 PM Isabell A wrote: Medication: ipratropium-albuterol  (DUONEB) 0.5-2.5 (3) MG/3ML SOLN  Has the patient contacted their pharmacy? Yes (Agent: If no, request that the patient contact the pharmacy for the refill. If patient does not wish to contact the pharmacy document the reason why and proceed with request.) (Agent: If yes, when and what did the pharmacy advise?)  This is the patient's preferred pharmacy:  [id number] - [place] ([place]), [place] - [address] [place] ([place]) [place] [address] Phone: [phone] Fax: [phone]  Is this the correct pharmacy for this prescription? Yes If no, delete pharmacy and type the correct one.   Has the prescription been filled recently? Yes  Is the patient out of the medication? Yes  Has the patient been seen for an appointment in the last year OR does the patient have an upcoming appointment? Yes  Can we respond through MyChart? No  Agent: Please be advised that Rx refills may take up to 3 business days. We ask that you follow-up with your pharmacy.

## 2024-05-27 MED ORDER — IPRATROPIUM-ALBUTEROL 0.5-2.5 (3) MG/3ML IN SOLN: 3.0000 mL | RESPIRATORY_TRACT | 5 refills | Status: AC | PRN

## 2024-06-05 ENCOUNTER — Ambulatory Visit (INDEPENDENT_AMBULATORY_CARE_PROVIDER_SITE_OTHER): Admitting: Primary Care

## 2024-06-10 ENCOUNTER — Ambulatory Visit: Admitting: Pulmonary Disease

## 2024-06-13 ENCOUNTER — Encounter (INDEPENDENT_AMBULATORY_CARE_PROVIDER_SITE_OTHER): Payer: Self-pay

## 2024-06-13 ENCOUNTER — Ambulatory Visit

## 2024-06-13 ENCOUNTER — Ambulatory Visit: Admitting: Pulmonary Disease

## 2024-06-13 ENCOUNTER — Ambulatory Visit (INDEPENDENT_AMBULATORY_CARE_PROVIDER_SITE_OTHER): Admitting: Pulmonary Disease

## 2024-06-13 ENCOUNTER — Encounter: Payer: Self-pay | Admitting: Pulmonary Disease

## 2024-06-13 VITALS — BP 122/82 | HR 67 | Ht 62.5 in | Wt 143.4 lb

## 2024-06-13 DIAGNOSIS — Z760 Encounter for issue of repeat prescription: Secondary | ICD-10-CM

## 2024-06-13 DIAGNOSIS — J4540 Moderate persistent asthma, uncomplicated: Secondary | ICD-10-CM | POA: Diagnosis not present

## 2024-06-13 DIAGNOSIS — J324 Chronic pansinusitis: Secondary | ICD-10-CM

## 2024-06-13 DIAGNOSIS — H9313 Tinnitus, bilateral: Secondary | ICD-10-CM | POA: Diagnosis not present

## 2024-06-13 LAB — PULMONARY FUNCTION TEST
DL/VA % pred: 131 %
DL/VA: 5.58 ml/min/mmHg/L
DLCO unc % pred: 86 %
DLCO unc: 16.66 ml/min/mmHg
FEF 25-75 Post: 0.73 L/s
FEF 25-75 Pre: 1.89 L/s
FEF2575-%Change-Post: -61 %
FEF2575-%Pred-Post: 32 %
FEF2575-%Pred-Pre: 83 %
FEV1-%Change-Post: -12 %
FEV1-%Pred-Post: 71 %
FEV1-%Pred-Pre: 81 %
FEV1-Post: 1.72 L
FEV1-Pre: 1.96 L
FEV1FVC-%Change-Post: 5 %
FEV1FVC-%Pred-Pre: 100 %
FEV6-%Change-Post: -18 %
FEV6-%Pred-Post: 67 %
FEV6-%Pred-Pre: 82 %
FEV6-Post: 2.03 L
FEV6-Pre: 2.48 L
FEV6FVC-%Change-Post: 0 %
FEV6FVC-%Pred-Post: 103 %
FEV6FVC-%Pred-Pre: 102 %
FVC-%Change-Post: -16 %
FVC-%Pred-Post: 66 %
FVC-%Pred-Pre: 80 %
FVC-Post: 2.08 L
FVC-Pre: 2.5 L
Post FEV1/FVC ratio: 83 %
Post FEV6/FVC ratio: 100 %
Pre FEV1/FVC ratio: 78 %
Pre FEV6/FVC Ratio: 99 %
RV % pred: -62 %
RV: -1.2 L
TLC % pred: 87 %
TLC: 4.23 L

## 2024-06-13 MED ORDER — DOXYCYCLINE HYCLATE 100 MG PO TABS: 100.0000 mg | ORAL_TABLET | Freq: Two times a day (BID) | ORAL | 0 refills | Status: AC

## 2024-06-13 MED ORDER — PREDNISONE 10 MG PO TABS: 10.0000 mg | ORAL_TABLET | Freq: Every day | ORAL | 0 refills | Status: DC

## 2024-06-13 MED ORDER — DULERA 100-5 MCG/ACT IN AERO: 2.0000 | INHALATION_SPRAY | Freq: Two times a day (BID) | RESPIRATORY_TRACT | 11 refills | Status: AC

## 2024-06-13 MED ORDER — VENTOLIN HFA 108 (90 BASE) MCG/ACT IN AERS: 1.0000 | INHALATION_SPRAY | Freq: Four times a day (QID) | RESPIRATORY_TRACT | 0 refills | Status: DC | PRN

## 2024-06-13 NOTE — Progress Notes (Signed)
 Full pft performed today

## 2024-06-13 NOTE — Progress Notes (Signed)
 Synopsis: Referred in July 2025 for COPD  Subjective:   PATIENT ID: Briana Parker, Briana Parker   HPI  Chief Complaint  Patient presents with   Medical Management of Chronic Issues    Post PFT, sick x 2 days throat / congestion    Briana Parker is a 58 year old woman, never smoker with history of GERD, chronic pansinusitis and asthma who returns to pulmonary clinic.  Initial OV 03/28/24 Patient has asthma and chronic bronchitis, using albuterol  nebulizer and Dulera  inhaler. She feels the nebulizer dose is insufficient. Despite these treatments, she experiences persistent respiratory symptoms and central chest pain, especially after coughing, since a severe COVID-19 infection in 2020.  Her chest pain worsens with coughing. Every cold progresses to her chest, causing difficulty in breathing and chest discomfort. Wheezing occurs with excessive coughing. Sinus issues lead to frequent colds, sinus congestion, and nighttime coughing with thick, clear mucus. She uses Flonase  nasal spray and an allergy pill, cetirizine . Earaches and eye pressure accompany her sinus problems.  She has a history of secondhand smoke exposure from her parents, who were smokers, and has had asthma since childhood. She maintains an active lifestyle, working out three days a week and caring for children, which she believes contributes to her frequent illnesses.  OV 06/13/24  PFTs today are within normal limits pre-bronchodilator with decline in FEV1 and FVC post-bronchodilator. Patient is not feeling well today.   She is having trouble with her sinuses with increased pressure, headache and post-nasal drainage with cough. No increase in wheezing.   She has been using dulera  2 puffs twice day mostly as needed which has been helpful since last visit.   Reviewed CT Chest scan and CT Sinus scan. There is evidence of air trapping on CT Chest. Area of mild mucosal thickening on the  sinus scan.   Discussed referral to ENT given recurrent sinus issues and she was amenable to referral.  Past Medical History:  Diagnosis Date   Allergic rhinitis    Allergy    seasonal   Anxiety    Arthritis    back   Chronic bronchitis (HCC)    COPD (chronic obstructive pulmonary disease) (HCC)    Depression    Fibromyalgia    GERD (gastroesophageal reflux disease)    Hyperlipidemia    Low back pain      Family History  Problem Relation Age of Onset   Diabetes Mother        died recently in 01-20-2010 due to 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  She was on dialysis and died with cardiac arrest as per pt.   Heart disease Father    Hypertension Father    Coronary artery disease Other        Strong family history in first degree relatives   Colon cancer Neg Hx      Social History   Socioeconomic History   Marital status: Married    Spouse name: Not on file   Number of children: Not on file   Years of education: Not on file   Highest education level: Not on file  Occupational History   Not on file  Tobacco Use   Smoking status: Never   Smokeless tobacco: Never  Substance and Sexual Activity   Alcohol use: No    Alcohol/week: 0.0 standard drinks of alcohol   Drug use: No   Sexual activity: Yes  Other Topics Concern   Not on file  Social History Narrative  Exercises Regularly, currently takes care of one child.  Lives at home with daughter.   Social Drivers of Corporate investment banker Strain: Not on file  Food Insecurity: Not on file  Transportation Needs: Not on file  Physical Activity: Not on file  Stress: Not on file  Social Connections: Not on file  Intimate Partner Violence: Not on file     Allergies  Allergen Reactions   Augmentin  [Amoxicillin -Pot Clavulanate] Diarrhea   Codeine      REACTION: unknown reaction   Hydrocodone    Ibuprofen  Swelling   Nortriptyline      Nightmares   Pravastatin Sodium     REACTION: Pruritis and muscle pain   Simvastatin      REACTION: Abdominal pain, nausea, vomitting     Outpatient Medications Prior to Visit  Medication Sig Dispense Refill   baclofen  (LIORESAL ) 10 MG tablet TAKE 1 TABLET BY MOUTH THREE TIMES DAILY 30 tablet 0   cetirizine  (ZYRTEC ) 10 MG tablet Take 1 tablet (10 mg total) by mouth daily. 90 tablet 1   fluticasone  (FLONASE ) 50 MCG/ACT nasal spray Use 1 spray(s) in each nostril once daily 16 g 0   ipratropium (ATROVENT ) 0.03 % nasal spray Place 2 sprays into both nostrils every 12 (twelve) hours. 30 mL 12   ipratropium-albuterol  (DUONEB) 0.5-2.5 (3) MG/3ML SOLN Take 3 mLs by nebulization every 4 (four) hours as needed. 360 mL 5   Misc. Devices MISC Nebulizer Mask for nebulizer machine 1 each 0   omeprazole  (PRILOSEC) 40 MG capsule TAKE 1 CAPSULE BY MOUTH ONCE DAILY AS NEEDED FOR  ACID  REFLUX 90 capsule 0   DULERA  100-5 MCG/ACT AERO Inhale 2 puffs by mouth twice daily 13 g 0   VENTOLIN  HFA 108 (90 Base) MCG/ACT inhaler INHALE 2 PUFFS BY MOUTH 4 TIMES DAILY AS NEEDED FOR WHEEZING OR SHORTNESS OF BREATH 18 g 0   No facility-administered medications prior to visit.   Review of Systems  Constitutional:  Negative for chills, fever, malaise/fatigue and weight loss.  HENT:  Positive for congestion, ear pain and sinus pain. Negative for sore throat.   Eyes: Negative.   Respiratory:  Positive for cough, sputum production, shortness of breath and wheezing. Negative for hemoptysis.   Cardiovascular:  Negative for chest pain, palpitations, orthopnea, claudication and leg swelling.  Gastrointestinal:  Negative for abdominal pain, heartburn, nausea and vomiting.  Genitourinary: Negative.   Musculoskeletal:  Negative for joint pain and myalgias.  Skin:  Negative for rash.  Neurological:  Negative for weakness.  Endo/Heme/Allergies: Negative.   Psychiatric/Behavioral: Negative.     Objective:   Vitals:   06/13/24 1043  BP: 122/82  Pulse: 67  SpO2: 98%    Physical Exam Constitutional:       General: She is not in acute distress.    Appearance: Normal appearance.  Eyes:     General: No scleral icterus.    Conjunctiva/sclera: Conjunctivae normal.  Cardiovascular:     Rate and Rhythm: Normal rate and regular rhythm.  Pulmonary:     Breath sounds: No wheezing, rhonchi or rales.  Musculoskeletal:     Right lower leg: No edema.     Left lower leg: No edema.  Skin:    General: Skin is warm and dry.  Neurological:     General: No focal deficit present.    CBC    Component Value Date/Time   WBC 3.7 01/17/2024 1021   WBC 4.4 12/15/2015 1131   RBC 4.52 01/17/2024 1021  RBC 4.60 12/15/2015 1131   HGB 13.2 01/17/2024 1021   HCT 39.6 01/17/2024 1021   PLT 270 01/17/2024 1021   MCV 88 01/17/2024 1021   MCH 29.2 01/17/2024 1021   MCH 27.8 12/15/2015 1131   MCHC 33.3 01/17/2024 1021   MCHC 32.1 12/15/2015 1131   RDW 11.8 01/17/2024 1021   LYMPHSABS 1.9 01/17/2024 1021   MONOABS 0.4 10/11/2007 2028   EOSABS 0.0 01/17/2024 1021   BASOSABS 0.0 01/17/2024 1021      Latest Ref Rng & Units 01/17/2024   10:21 AM 02/16/2023   10:03 AM 04/19/2021   11:27 AM  BMP  Glucose 70 - 99 mg/dL 85  81  90   BUN 8 - 27 mg/dL 16  11  11    Creatinine 0.57 - 1.00 mg/dL 9.23  9.20  9.21   BUN/Creat Ratio 12 - 28 21  14  14    Sodium 134 - 144 mmol/L 140  139  141   Potassium 3.5 - 5.2 mmol/L 3.9  4.7  4.0   Chloride 96 - 106 mmol/L 104  102  103   CO2 20 - 29 mmol/L 23  21  24    Calcium  8.7 - 10.3 mg/dL 9.4  9.6  9.5    Chest imaging: CT Chest 04/13/24 1. Bland appearing, bandlike scarring of the bilateral lung bases. No evidence of fibrotic interstitial lung disease. 2. Mosaic attenuation with lobular air trapping on expiratory phase imaging, consistent with small airways disease.  CT Maxillofacial 04/13/24 1. Small focus of mucosal thickening within the left maxillary sinus near the sinus ostium, contributing to mild narrowing of the ostiomeatal unit without occlusion. Sinuses and sinus  outflow tracts otherwise clear. No air fluid levels. 2. No acute abnormality of the facial bones.  PFT:    Latest Ref Rng & Units 06/13/2024    9:13 AM  PFT Results  FVC-Pre L 2.50  P  FVC-Predicted Pre % 80  P  FVC-Post L 2.08  P  FVC-Predicted Post % 66  P  Pre FEV1/FVC % % 78  P  Post FEV1/FCV % % 83  P  FEV1-Pre L 1.96  P  FEV1-Predicted Pre % 81  P  FEV1-Post L 1.72  P  DLCO uncorrected ml/min/mmHg 16.66  P  DLCO UNC% % 86  P  DLVA Predicted % 131  P  TLC L 4.23  P  TLC % Predicted % 87  P  RV % Predicted % -62  P    P Preliminary result    Labs:  Path:  Echo:  Heart Catheterization:    Assessment & Plan:   Moderate persistent asthma without complication - Plan: mometasone -formoterol  (DULERA ) 100-5 MCG/ACT AERO, VENTOLIN  HFA 108 (90 Base) MCG/ACT inhaler  Tinnitus of both ears - Plan: Ambulatory referral to ENT  Chronic pansinusitis - Plan: Ambulatory referral to ENT, doxycycline (VIBRA-TABS) 100 MG tablet, predniSONE  (DELTASONE ) 10 MG tablet  Medication refill - Plan: mometasone -formoterol  (DULERA ) 100-5 MCG/ACT AERO, VENTOLIN  HFA 108 (90 Base) MCG/ACT inhaler  Discussion: Briana Parker is a 58 year old woman, never smoker with history of GERD, chronic pansinusitis and asthma who returns to pulmonary clinic.  Asthma - continue Dulera  two puffs twice daily. - Continue DuoNeb nebulizer solution every 4-6 hours as needed. - Schedule breathing test to assess lung function. - Instruct to use albuterol  rescue inhaler as needed.  Chronic sinusitis Chronic sinusitis with sinus drainage, earaches, and eye pressure. Symptoms worsen with colds and fall season. -  refer to ENT for further evaluation - Continue nasal sprays and zyrtec  - start doxycycline and prednisone  for sinusitis - Tylenol  prn for pain  Follow up in 1 year, call sooner if needed  Dorn Chill, MD [facility] Office: [phone]    Current Outpatient  Medications:    baclofen  (LIORESAL ) 10 MG tablet, TAKE 1 TABLET BY MOUTH THREE TIMES DAILY, Disp: 30 tablet, Rfl: 0   cetirizine  (ZYRTEC ) 10 MG tablet, Take 1 tablet (10 mg total) by mouth daily., Disp: 90 tablet, Rfl: 1   doxycycline (VIBRA-TABS) 100 MG tablet, Take 1 tablet (100 mg total) by mouth 2 (two) times daily., Disp: 14 tablet, Rfl: 0   fluticasone  (FLONASE ) 50 MCG/ACT nasal spray, Use 1 spray(s) in each nostril once daily, Disp: 16 g, Rfl: 0   ipratropium (ATROVENT ) 0.03 % nasal spray, Place 2 sprays into both nostrils every 12 (twelve) hours., Disp: 30 mL, Rfl: 12   ipratropium-albuterol  (DUONEB) 0.5-2.5 (3) MG/3ML SOLN, Take 3 mLs by nebulization every 4 (four) hours as needed., Disp: 360 mL, Rfl: 5   Misc. Devices MISC, Nebulizer Mask for nebulizer machine, Disp: 1 each, Rfl: 0   omeprazole  (PRILOSEC) 40 MG capsule, TAKE 1 CAPSULE BY MOUTH ONCE DAILY AS NEEDED FOR  ACID  REFLUX, Disp: 90 capsule, Rfl: 0   predniSONE  (DELTASONE ) 10 MG tablet, Take 1 tablet (10 mg total) by mouth daily with breakfast., Disp: 7 tablet, Rfl: 0   mometasone -formoterol  (DULERA ) 100-5 MCG/ACT AERO, Inhale 2 puffs into the lungs 2 (two) times daily., Disp: 13 g, Rfl: 11   VENTOLIN  HFA 108 (90 Base) MCG/ACT inhaler, Inhale 1-2 puffs into the lungs every 6 (six) hours as needed for wheezing or shortness of breath., Disp: 18 g, Rfl: 0

## 2024-06-13 NOTE — Patient Instructions (Addendum)
 Start doxycycline 1 tab twice daily for 7 days  Start prednisone  10mg  daily for 7 days  Take tylenol  as needed based on the directions on the bottle for headache/sinus pain  Your breathing tests are normal  We will refer you to ENT for your recurrent sinus issues  Follow up in 1 year, call sooner if needed

## 2024-06-13 NOTE — Patient Instructions (Signed)
 Full pft performed today

## 2024-06-15 ENCOUNTER — Other Ambulatory Visit (INDEPENDENT_AMBULATORY_CARE_PROVIDER_SITE_OTHER): Payer: Self-pay | Admitting: Primary Care

## 2024-06-15 DIAGNOSIS — J329 Chronic sinusitis, unspecified: Secondary | ICD-10-CM

## 2024-06-17 NOTE — Telephone Encounter (Signed)
 Requested Prescriptions  Pending Prescriptions Disp Refills   cetirizine  (ZYRTEC ) 10 MG tablet [Pharmacy Med Name: Cetirizine  HCl 10 MG Oral Tablet] 90 tablet 0    Sig: Take 1 tablet by mouth once daily     Ear, Nose, and Throat:  Antihistamines 2 Passed - 06/17/2024 11:28 AM      Passed - Cr in normal range and within 360 days    Creat  Date Value Ref Range Status  02/11/2015 0.77 0.50 - 1.10 mg/dL Final   Creatinine, Ser  Date Value Ref Range Status  01/17/2024 0.76 0.57 - 1.00 mg/dL Final         Passed - Valid encounter within last 12 months    Recent Outpatient Visits           8 months ago Bronchospasm   [place] [facility] Celestia Rosaline SQUIBB, NP   11 months ago Asthma, exercise induced   [place] [facility] Celestia Rosaline SQUIBB, NP   1 year ago Encounter to establish care   [place] [facility] Celestia Rosaline SQUIBB, NP

## 2024-06-20 ENCOUNTER — Ambulatory Visit (INDEPENDENT_AMBULATORY_CARE_PROVIDER_SITE_OTHER): Admitting: Primary Care

## 2024-06-22 ENCOUNTER — Other Ambulatory Visit (INDEPENDENT_AMBULATORY_CARE_PROVIDER_SITE_OTHER): Payer: Self-pay | Admitting: Primary Care

## 2024-06-24 ENCOUNTER — Telehealth (INDEPENDENT_AMBULATORY_CARE_PROVIDER_SITE_OTHER): Payer: Self-pay

## 2024-06-24 ENCOUNTER — Other Ambulatory Visit (INDEPENDENT_AMBULATORY_CARE_PROVIDER_SITE_OTHER): Payer: Self-pay

## 2024-06-24 MED ORDER — OMEPRAZOLE 40 MG PO CPDR: DELAYED_RELEASE_CAPSULE | ORAL | 0 refills | Status: DC

## 2024-06-24 NOTE — Telephone Encounter (Signed)
 LVM for patient to call back regarding 10/172025 appointment.  Trying to see if patient can come in at 8:30 instead of 8:00.

## 2024-06-27 ENCOUNTER — Ambulatory Visit (INDEPENDENT_AMBULATORY_CARE_PROVIDER_SITE_OTHER)

## 2024-06-27 ENCOUNTER — Encounter (INDEPENDENT_AMBULATORY_CARE_PROVIDER_SITE_OTHER): Payer: Self-pay

## 2024-06-27 VITALS — BP 128/88 | HR 75 | Ht 62.0 in | Wt 140.0 lb

## 2024-06-27 DIAGNOSIS — R0982 Postnasal drip: Secondary | ICD-10-CM | POA: Diagnosis not present

## 2024-06-27 DIAGNOSIS — R519 Headache, unspecified: Secondary | ICD-10-CM | POA: Diagnosis not present

## 2024-06-27 DIAGNOSIS — H9203 Otalgia, bilateral: Secondary | ICD-10-CM

## 2024-06-27 DIAGNOSIS — J3489 Other specified disorders of nose and nasal sinuses: Secondary | ICD-10-CM | POA: Diagnosis not present

## 2024-06-27 DIAGNOSIS — H9313 Tinnitus, bilateral: Secondary | ICD-10-CM | POA: Diagnosis not present

## 2024-06-27 DIAGNOSIS — J343 Hypertrophy of nasal turbinates: Secondary | ICD-10-CM | POA: Diagnosis not present

## 2024-06-27 DIAGNOSIS — H9193 Unspecified hearing loss, bilateral: Secondary | ICD-10-CM | POA: Diagnosis not present

## 2024-06-27 MED ORDER — OMEPRAZOLE 40 MG PO CPDR: 40.0000 mg | DELAYED_RELEASE_CAPSULE | Freq: Two times a day (BID) | ORAL | Status: DC

## 2024-06-27 NOTE — Progress Notes (Unsigned)
 Dear Dr. Kara, Here is my assessment for our mutual patient, Briana Parker. Thank you for allowing me the opportunity to care for your patient. Please do not hesitate to contact me should you have any other questions. Sincerely, Dr. Hadassah Parker  Otolaryngology Clinic Note Referring provider: Dr. Kara HPI:   Initial HPI (06/27/2024) Discussed the use of AI scribe software for clinical note transcription with the patient, who gave verbal consent to proceed.  History of Present Illness Briana Parker is a 58 year old female with a history of sinus issues who presents with chronic sinus problems and tinnitus She was referred by her pulmonary doctor for recurrent sinus problems.  Facial pain, sinus pressure - Reports chronic persistent facial pain and headache  - Some nasal congestion, particularly at night - Limited relief with Flonase  and saline rinses - Recent course of antibiotics provided no improvement - Reports no discolored drainage from nose  Tinnitus and auditory disturbance Bilateral hearing loss  - Persistent bilateral ear ringing for approximately three years and becoming more bothersome  - Difficulty hearing conversations, requiring others to repeat herself - Frequent earaches - No prior hearing test performed - Frequent ear cleaning - Perceived progressive worsening of hearing over time    Independent Review of Additional Tests or Records:  Referral   CT sinus 04/13/24 independently reviewed and shows clear sinus cavities , inferior turbinate hypertrophy right > left   PMH/Meds/All/SocHx/FamHx/ROS:   Past Medical History:  Diagnosis Date   Allergic rhinitis    Allergy    seasonal   Anxiety    Arthritis    back   Chronic bronchitis (HCC)    COPD (chronic obstructive pulmonary disease) (HCC)    Depression    Fibromyalgia    GERD (gastroesophageal reflux disease)    Hyperlipidemia    Low back pain      Past Surgical History:  Procedure  Laterality Date   ABDOMINAL HYSTERECTOMY     CARPAL TUNNEL RELEASE Right 05/05/2015   Procedure: RIGHT CARPAL TUNNEL RELEASE;  Surgeon: Kay Briana Cummins, MD;  Location: [facility];  Service: Orthopedics;  Laterality: Right;   TRIGGER FINGER RELEASE Left 05/05/2015   Procedure: LEFT TRIGGER THUMB RELEASE;  Surgeon: Kay Briana Cummins, MD;  Location: [facility];  Service: Orthopedics;  Laterality: Left;   WISDOM TOOTH EXTRACTION      Family History  Problem Relation Age of Onset   Diabetes Mother        died recently in March 03, 2010 due to 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  She was on dialysis and died with cardiac arrest as per pt.   Heart disease Father    Hypertension Father    Coronary artery disease Other        Strong family history in first degree relatives   Colon cancer Neg Hx      Social Connections: Not on file     Current Outpatient Medications  Medication Instructions   baclofen  (LIORESAL ) 10 mg, Oral, 3 times daily   cetirizine  (ZYRTEC ) 10 mg, Oral, Daily   doxycycline (VIBRA-TABS) 100 mg, Oral, 2 times daily   fluticasone  (FLONASE ) 50 MCG/ACT nasal spray Use 1 spray(s) in each nostril once daily   ipratropium (ATROVENT ) 0.03 % nasal spray 2 sprays, Each Nare, Every 12 hours   ipratropium-albuterol  (DUONEB) 0.5-2.5 (3) MG/3ML SOLN 3 mLs, Nebulization, Every 4 hours PRN   Misc. Devices MISC Nebulizer Mask for nebulizer machine   mometasone -formoterol  (DULERA ) 100-5 MCG/ACT AERO 2 puffs,  Inhalation, 2 times daily   omeprazole  (PRILOSEC) 40 mg, Oral, 2 times daily   predniSONE  (DELTASONE ) 10 mg, Oral, Daily with breakfast   VENTOLIN  HFA 108 (90 Base) MCG/ACT inhaler 1-2 puffs, Inhalation, Every 6 hours PRN     Physical Exam:   BP 128/88 (BP Location: Right Arm, Patient Position: Sitting)   Pulse 75   Ht 5' 2 (1.575 m)   Wt 140 lb (63.5 kg)   SpO2 98%   BMI 25.61 kg/m   Salient findings:  CN II-XII intact Given history and complaints, ear microscopy was  indicated and performed for evaluation with findings as below in physical exam section and in procedure Bilateral EAC clear with no visible cerumen and canal skin shiny slightly irritated, TM intact bilaterally with aerated middle ear spaces  Anterior rhinoscopy: Septum midline; bilateral inferior turbinates with hypertrophy No lesions of oral cavity/oropharynx No obviously palpable neck masses/lymphadenopathy/thyromegaly No respiratory distress or stridor  Seprately Identifiable Procedures:  Prior to initiating any procedures, risks/benefits/alternatives were explained to the patient and verbal consent obtained.  Procedure (06/27/2024): Bilateral ear microscopy using microscope (CPT [id number]) Pre-procedure diagnosis: bilateral tinnitus, hearing loss, otalgia  Post-procedure diagnosis: same Indication: see above; given patient's otologic complaints and history, for improved and comprehensive examination of external ear and tympanic membrane, bilateral otologic examination using microscope was performed. Prior to proceeding, verbal consent was obtained after discussion of R/B/A  Procedure: Patient was placed semi-recumbent. Both ear canals were examined using the microscope with findings above. Patient tolerated the procedure well.   Impression & Plans:  Briana Parker is a 58 y.o. female with   1. Bilateral tinnitus   2. Bilateral hearing loss, unspecified hearing loss type   3. Facial pain   4. Hypertrophy of both inferior nasal turbinates   5. Nasal obstruction     Assessment and Plan Assessment & Plan Post-nasal drip  Throat mucus and postnasal drainage can have multiple causes but discussed one common contributor is GERD - Pt has GERD and currently on protonix 40 daily. Discussed increasing this to see if feeling of mucus in throat can be improved. She agrees to this.  - Increase Prilosec to 40 mg twice daily for three months. 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 Flonase  has limited success. Surgery discussed but not preferred at this time by patient  - Continue Flonase  as previously prescribed, if no benefit can trial other steroid based nasal sprays   Bilateral tinnitus and suspected hearing loss Bilateral tinnitus and suspected hearing loss Otalgia - Mild otitis externa today due to over-cleaning. Discussed cessation of ear cleaning - No prior hearing test conducted. - Refer to audiologist for hearing test. - Schedule follow-up appointment to review hearing test results.  Chronic facial pain  - CT shows no evidence of chronic sinusitis. 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    Dispense:  180 capsule    Refill:  O    Pt will need an ov for future refills  Thank you for allowing me the opportunity to care for your patient. Please do not hesitate to contact me should you have any other questions.  Sincerely, Briana Parody, MD Otolaryngologist (ENT), [facility] Phone: [phone] Fax: [phone]

## 2024-06-30 ENCOUNTER — Telehealth (INDEPENDENT_AMBULATORY_CARE_PROVIDER_SITE_OTHER): Payer: Self-pay

## 2024-06-30 NOTE — Telephone Encounter (Signed)
 Pt called LVM stating the medication you prescribed her you asked her to take it 2 times a day and when she picked up the prescription it only had 30 pills in it and said to take 1 time a day. She didn't know if you changed how she is suppose to take it or not.

## 2024-07-01 ENCOUNTER — Telehealth (INDEPENDENT_AMBULATORY_CARE_PROVIDER_SITE_OTHER): Payer: Self-pay

## 2024-07-01 NOTE — Telephone Encounter (Signed)
 Called Pt let her know about the mix up at the pharmacy and how she had picked up a prescription from her PCP rather than the one [facility] had sent over let her know that we are working on getting her the prescription sent over the way [facility] wants her to take it Pt understood and was thankful we got this figured out.

## 2024-07-01 NOTE — Telephone Encounter (Signed)
 Called Pt and let her know about the mix up at the pharmacy and what happened the Pt understood

## 2024-07-01 NOTE — Telephone Encounter (Signed)
 Let her know about how [facility]iello put in the prescription and that i am trying to get in contact with the pharmacy to get it straightened out for her.

## 2024-07-01 NOTE — Telephone Encounter (Signed)
 The prescription she had picked up yesterday was from her PCP not yours the pharamacy stated they did not receive yours they asked if you could send it over again.   [facility] [place]

## 2024-07-02 ENCOUNTER — Other Ambulatory Visit (INDEPENDENT_AMBULATORY_CARE_PROVIDER_SITE_OTHER): Payer: Self-pay

## 2024-07-02 ENCOUNTER — Telehealth (INDEPENDENT_AMBULATORY_CARE_PROVIDER_SITE_OTHER): Payer: Self-pay

## 2024-07-02 MED ORDER — PANTOPRAZOLE SODIUM 40 MG PO TBEC: 40.0000 mg | DELAYED_RELEASE_TABLET | Freq: Two times a day (BID) | ORAL | 0 refills | Status: DC

## 2024-07-02 NOTE — Telephone Encounter (Signed)
 The patient called in following up on the pharmacy issues addressed yesterday.  She spoke with the pharmacy late yesterday and they told her they never received our prescription for omeprazole   and only had the script from her PCP in their system  She uses [facility] in [address]  Thank you

## 2024-07-02 NOTE — Telephone Encounter (Signed)
 I told her they told me to have a new prescription sent over and the one she picked up was from her PCP [facility] wasn't in yesterday but it sending it over today.

## 2024-07-03 ENCOUNTER — Telehealth (INDEPENDENT_AMBULATORY_CARE_PROVIDER_SITE_OTHER): Payer: Self-pay | Admitting: Primary Care

## 2024-07-03 NOTE — Telephone Encounter (Signed)
 Called Pt LVM and let her know that the new prescription has been sent over

## 2024-07-03 NOTE — Telephone Encounter (Signed)
 Called pt to confirm appt. Pt will be present.

## 2024-07-04 ENCOUNTER — Other Ambulatory Visit (INDEPENDENT_AMBULATORY_CARE_PROVIDER_SITE_OTHER): Payer: Self-pay

## 2024-07-04 ENCOUNTER — Ambulatory Visit (INDEPENDENT_AMBULATORY_CARE_PROVIDER_SITE_OTHER): Admitting: Primary Care

## 2024-07-04 ENCOUNTER — Encounter (INDEPENDENT_AMBULATORY_CARE_PROVIDER_SITE_OTHER): Payer: Self-pay | Admitting: Primary Care

## 2024-07-04 ENCOUNTER — Telehealth (INDEPENDENT_AMBULATORY_CARE_PROVIDER_SITE_OTHER): Payer: Self-pay

## 2024-07-04 VITALS — BP 129/86 | HR 81 | Resp 16 | Ht 62.0 in | Wt 143.6 lb

## 2024-07-04 DIAGNOSIS — N393 Stress incontinence (female) (male): Secondary | ICD-10-CM

## 2024-07-04 DIAGNOSIS — R7303 Prediabetes: Secondary | ICD-10-CM | POA: Diagnosis not present

## 2024-07-04 DIAGNOSIS — E782 Mixed hyperlipidemia: Secondary | ICD-10-CM

## 2024-07-04 DIAGNOSIS — E559 Vitamin D deficiency, unspecified: Secondary | ICD-10-CM

## 2024-07-04 DIAGNOSIS — K219 Gastro-esophageal reflux disease without esophagitis: Secondary | ICD-10-CM | POA: Diagnosis not present

## 2024-07-04 DIAGNOSIS — M79645 Pain in left finger(s): Secondary | ICD-10-CM

## 2024-07-04 NOTE — Telephone Encounter (Signed)
 Called Pt and LVM to let her know the medication is finally being filled we got everything fixed at her pharmacy

## 2024-07-04 NOTE — Progress Notes (Signed)
 Subjective:  Patient ID: Briana Parker, female    DOB: 30-May-1966  Age: 58 y.o. MRN: [id number]  CC: Referral (Ortho-left hand 2 knots in palm /) and Cough (Urinary incontience )   Briana Parker presents for Follow-up of  prediabetes. Patient does not check blood sugar at home  Compliant with meds - Yes Checking CBGs? No  Fasting avg -   Postprandial average -  Exercising regularly? - Yes Watching carbohydrate intake? - No Neuropathy ? - No Hypoglycemic events - No  - Recovers with :   Pertinent ROS:  Polyuria - No Polydipsia - Yes Vision problems - No  Medications as noted below. Taking them regularly without complication/adverse reaction being reported today.  Also voices concern with urinary incontinence(stress) when she cough small amount of urine goes down.  Will teach Kegel exercises and if no improvement on follow-up discussed medication for urinary incontinence. She also would like to go to orthopedics on her left hand pain she has 2 knott in the palm of her hand and the 1st digit (10/10) - 2 months durations. and near her if finger the joint in her also that middle finger locks up.( Years)    Kairie has a past medical history of Allergic rhinitis, Allergy, Anxiety, Arthritis, Chronic bronchitis (HCC), COPD (chronic obstructive pulmonary disease) (HCC), Depression, Fibromyalgia, GERD (gastroesophageal reflux disease), Hyperlipidemia, and Low back pain.   She has a past surgical history that includes Abdominal hysterectomy; Wisdom tooth extraction; Carpal tunnel release (Right, 05/05/2015); and Trigger finger release (Left, 05/05/2015).   Her family history includes Coronary artery disease in an other family member; Diabetes in her mother; Heart disease in her father; Hypertension in her father.She reports that she has never smoked. She has never used smokeless tobacco. She reports that she does not drink alcohol and does not use drugs.  Current Outpatient Medications  on File Prior to Visit  Medication Sig Dispense Refill   baclofen  (LIORESAL ) 10 MG tablet TAKE 1 TABLET BY MOUTH THREE TIMES DAILY 30 tablet 0   cetirizine  (ZYRTEC ) 10 MG tablet Take 1 tablet by mouth once daily 90 tablet 0   doxycycline (VIBRA-TABS) 100 MG tablet Take 1 tablet (100 mg total) by mouth 2 (two) times daily. 14 tablet 0   fluticasone  (FLONASE ) 50 MCG/ACT nasal spray Use 1 spray(s) in each nostril once daily 16 g 0   ipratropium (ATROVENT ) 0.03 % nasal spray Place 2 sprays into both nostrils every 12 (twelve) hours. 30 mL 12   ipratropium-albuterol  (DUONEB) 0.5-2.5 (3) MG/3ML SOLN Take 3 mLs by nebulization every 4 (four) hours as needed. 360 mL 5   Misc. Devices MISC Nebulizer Mask for nebulizer machine 1 each 0   mometasone -formoterol  (DULERA ) 100-5 MCG/ACT AERO Inhale 2 puffs into the lungs 2 (two) times daily. 13 g 11   pantoprazole (PROTONIX) 40 MG tablet Take 1 tablet (40 mg total) by mouth 2 (two) times daily. 180 tablet 0   predniSONE  (DELTASONE ) 10 MG tablet Take 1 tablet (10 mg total) by mouth daily with breakfast. 7 tablet 0   VENTOLIN  HFA 108 (90 Base) MCG/ACT inhaler Inhale 1-2 puffs into the lungs every 6 (six) hours as needed for wheezing or shortness of breath. 18 g 0   No current facility-administered medications on file prior to visit.    Review of Systems  Respiratory:  Positive for cough.    Comprehensive ROS Pertinent positive and negative noted in HPI   Objective:  BP 129/86   Pulse 81  Resp 16   Ht 5' 2 (1.575 m)   Wt 143 lb 9.6 oz (65.1 kg)   SpO2 100%   BMI 26.26 kg/m   BP Readings from Last 3 Encounters:  07/04/24 129/86  06/27/24 128/88  06/13/24 122/82    Wt Readings from Last 3 Encounters:  07/04/24 143 lb 9.6 oz (65.1 kg)  06/27/24 140 lb (63.5 kg)  06/13/24 143 lb 6.4 oz (65 kg)    Physical Exam Vitals reviewed.  Constitutional:      Appearance: Normal appearance. She is obese.  HENT:     Head: Normocephalic.     Right  Ear: Tympanic membrane, ear canal and external ear normal.     Left Ear: Tympanic membrane, ear canal and external ear normal.     Nose: Nose normal.     Mouth/Throat:     Mouth: Mucous membranes are moist.  Eyes:     Extraocular Movements: Extraocular movements intact.     Pupils: Pupils are equal, round, and reactive to light.  Cardiovascular:     Rate and Rhythm: Normal rate and regular rhythm.  Pulmonary:     Effort: Pulmonary effort is normal.     Breath sounds: Normal breath sounds.  Abdominal:     General: Bowel sounds are normal.     Palpations: Abdomen is soft.  Musculoskeletal:        General: Normal range of motion.     Cervical back: Normal range of motion.     Comments: See HPI  Skin:    General: Skin is warm and dry.  Neurological:     Mental Status: She is alert and oriented to person, place, and time.  Psychiatric:        Mood and Affect: Mood normal.        Behavior: Behavior normal.        Thought Content: Thought content normal.     Lab Results  Component Value Date   HGBA1C 6.1 (H) 01/17/2024   HGBA1C 6.2 (H) 02/16/2023   HGBA1C 5.8 (A) 04/19/2021    Lab Results  Component Value Date   WBC 3.7 01/17/2024   HGB 13.2 01/17/2024   HCT 39.6 01/17/2024   PLT 270 01/17/2024   GLUCOSE 85 01/17/2024   CHOL 161 01/17/2024   TRIG 96 01/17/2024   HDL 57 01/17/2024   LDLCALC 86 01/17/2024   ALT 12 01/17/2024   AST 17 01/17/2024   NA 140 01/17/2024   K 3.9 01/17/2024   CL 104 01/17/2024   CREATININE 0.76 01/17/2024   BUN 16 01/17/2024   CO2 23 01/17/2024   TSH 2.035 04/15/2014   INR 0.9 05/08/2007   HGBA1C 6.1 (H) 01/17/2024    Title   Diabetic Foot Exam - detailed    Semmes-Weinstein Monofilament Test + means has sensation and - means no sensation      Image components are not supported.   Image components are not supported. Image components are not supported.  Tuning Fork Comments      Assessment & Plan:  Nivedita was seen  today for referral and cough.  Diagnoses and all orders for this visit:  Vitamin D  deficiency -     VITAMIN D  25 Hydroxy (Vit-D Deficiency, Fractures)  Mixed hyperlipidemia She is on a statin  Healthy lifestyle diet of fruits vegetables fish nuts whole grains and low saturated fat . Foods high in cholesterol or liver, fatty meats,cheese, butter avocados, nuts and seeds, chocolate and fried foods.  Prediabetes - educated on lifestyle modifications, including but not limited to diet choices and adding exercise to daily routine.   -     Hemoglobin A1c  Gastroesophageal reflux disease without esophagitis Follow-up by gastroenterologist increase Protonix to 40 mg twice daily pharmacy did not refill stated did not receive prescription from physician.  Creatinine up prescription for patient to take to the pharmacy for medication Previously she was on 40 mg daily by PCP  Pain in finger of left hand -     Ambulatory referral to Orthopedic Surgery    Follow-up:  Return in about 6 months (around 01/02/2025) for fasting labs.  The above assessment and management plan was discussed with the patient. The patient verbalized understanding of and has agreed to the management plan. Patient is aware to call the clinic if symptoms fail to improve or worsen. Patient is aware when to return to the clinic for a follow-up visit. Patient educated on when it is appropriate to go to the emergency department.   Rosaline Bohr, NP-C

## 2024-07-04 NOTE — Telephone Encounter (Signed)
 Called pharmacy to see what was going on with the prescription and got everything cleared up for the Pt.

## 2024-07-05 ENCOUNTER — Other Ambulatory Visit (INDEPENDENT_AMBULATORY_CARE_PROVIDER_SITE_OTHER): Payer: Self-pay | Admitting: Primary Care

## 2024-07-05 ENCOUNTER — Encounter (INDEPENDENT_AMBULATORY_CARE_PROVIDER_SITE_OTHER): Payer: Self-pay | Admitting: Primary Care

## 2024-07-05 DIAGNOSIS — Z760 Encounter for issue of repeat prescription: Secondary | ICD-10-CM

## 2024-07-05 DIAGNOSIS — J4540 Moderate persistent asthma, uncomplicated: Secondary | ICD-10-CM

## 2024-07-05 LAB — HEMOGLOBIN A1C
Est. average glucose Bld gHb Est-mCnc: 120 mg/dL
Hgb A1c MFr Bld: 5.8 % — ABNORMAL HIGH (ref 4.8–5.6)

## 2024-07-05 LAB — VITAMIN D 25 HYDROXY (VIT D DEFICIENCY, FRACTURES): Vit D, 25-Hydroxy: 45.1 ng/mL (ref 30.0–100.0)

## 2024-07-07 ENCOUNTER — Telehealth (INDEPENDENT_AMBULATORY_CARE_PROVIDER_SITE_OTHER): Payer: Self-pay | Admitting: Primary Care

## 2024-07-07 ENCOUNTER — Other Ambulatory Visit (INDEPENDENT_AMBULATORY_CARE_PROVIDER_SITE_OTHER): Payer: Self-pay

## 2024-07-07 ENCOUNTER — Telehealth (INDEPENDENT_AMBULATORY_CARE_PROVIDER_SITE_OTHER): Payer: Self-pay

## 2024-07-07 MED ORDER — PANTOPRAZOLE SODIUM 40 MG PO TBEC: 40.0000 mg | DELAYED_RELEASE_TABLET | Freq: Every day | ORAL | 0 refills | Status: DC

## 2024-07-07 NOTE — Telephone Encounter (Signed)
 The patient called in to let us  know she rejected (she did not pick it up) the most recent medication that was sent into her pharmacy, she says that medication does not work for her.  The pharmacist told the patient to ask the provider to override the medication and go back to the original medication for her acid reflux.  Please call the patient with any questions/when it is completed. Thank you.

## 2024-07-07 NOTE — Telephone Encounter (Signed)
 Will forward to provider

## 2024-07-07 NOTE — Telephone Encounter (Signed)
 The patient would like to speak with a member of practice staff when possible to discuss medical stuff

## 2024-07-07 NOTE — Telephone Encounter (Signed)
 Error

## 2024-07-08 ENCOUNTER — Telehealth (INDEPENDENT_AMBULATORY_CARE_PROVIDER_SITE_OTHER): Payer: Self-pay | Admitting: Primary Care

## 2024-07-08 NOTE — Telephone Encounter (Signed)
 Pt stating that she needs some supplies  Female supplies. Stated that she spoke with CMA Jaya about those supplies and said that she followed up with the people at thew place she signed her up with. The said the machine was the only thing that was done. Said she is waiting on CMA to order female supplies. Per pt [phone] is a good call back number for the pt.

## 2024-07-08 NOTE — Telephone Encounter (Signed)
 Patient called waiting on the nurse from PCP's office to call her back from yesterday was supposed to get her medical supplies fom her health company. Needs a aprescription from the doctor.

## 2024-07-11 DIAGNOSIS — J4540 Moderate persistent asthma, uncomplicated: Secondary | ICD-10-CM | POA: Diagnosis not present

## 2024-07-11 DIAGNOSIS — J441 Chronic obstructive pulmonary disease with (acute) exacerbation: Secondary | ICD-10-CM | POA: Diagnosis not present

## 2024-07-12 DIAGNOSIS — J441 Chronic obstructive pulmonary disease with (acute) exacerbation: Secondary | ICD-10-CM | POA: Diagnosis not present

## 2024-07-14 ENCOUNTER — Ambulatory Visit (INDEPENDENT_AMBULATORY_CARE_PROVIDER_SITE_OTHER): Payer: Self-pay | Admitting: Primary Care

## 2024-07-14 ENCOUNTER — Encounter: Payer: Self-pay | Admitting: Radiology

## 2024-07-14 DIAGNOSIS — J449 Chronic obstructive pulmonary disease, unspecified: Secondary | ICD-10-CM | POA: Diagnosis not present

## 2024-07-14 DIAGNOSIS — R32 Unspecified urinary incontinence: Secondary | ICD-10-CM | POA: Diagnosis not present

## 2024-07-14 NOTE — Telephone Encounter (Signed)
 Reached out to [employer] and spoke with Harlene and started an order for pt for incontinence supplies. Informed pt that order has been done. Made pt aware that she will be receiving 200 pull-ups a month, medium size gloves and 150 Chux pads. Pt states she was wanting pads.   Reached out to [facility] to see about getting pt pads spoke with Therisa and per [facility] will not cover pads or liners. Reached back out to pt and made aware pt is agreeable with receiving the pull-ups

## 2024-07-22 ENCOUNTER — Telehealth (INDEPENDENT_AMBULATORY_CARE_PROVIDER_SITE_OTHER): Payer: Self-pay | Admitting: Primary Care

## 2024-07-22 NOTE — Telephone Encounter (Signed)
 Form is awaiting pt signature

## 2024-07-22 NOTE — Telephone Encounter (Signed)
 Will forward to provider

## 2024-07-22 NOTE — Telephone Encounter (Signed)
 Copied from CRM #[id number]. Topic: General - Call Back - No Documentation >> Jul 22, 2024 10:32 AM Tonda B wrote: Reason for CRM: patient is calling saying that she has not got her supplies delivered to her home yet please call pt back [phone]

## 2024-07-23 ENCOUNTER — Other Ambulatory Visit (INDEPENDENT_AMBULATORY_CARE_PROVIDER_SITE_OTHER): Payer: Self-pay | Admitting: Primary Care

## 2024-07-23 NOTE — Telephone Encounter (Signed)
 Will forward to provider

## 2024-07-25 ENCOUNTER — Telehealth (INDEPENDENT_AMBULATORY_CARE_PROVIDER_SITE_OTHER): Payer: Self-pay | Admitting: Primary Care

## 2024-07-25 ENCOUNTER — Telehealth (INDEPENDENT_AMBULATORY_CARE_PROVIDER_SITE_OTHER): Payer: Self-pay

## 2024-07-25 ENCOUNTER — Ambulatory Visit: Admitting: Orthopaedic Surgery

## 2024-07-25 DIAGNOSIS — M65332 Trigger finger, left middle finger: Secondary | ICD-10-CM | POA: Diagnosis not present

## 2024-07-25 DIAGNOSIS — M65352 Trigger finger, left little finger: Secondary | ICD-10-CM | POA: Diagnosis not present

## 2024-07-25 NOTE — Telephone Encounter (Signed)
 Will forward to provider

## 2024-07-25 NOTE — Telephone Encounter (Signed)
 Patient called stating that she wanted to prescription re-sent over I explained to the patient that just two weeks ago after going back and fourth with the insurance company and finally getting the medication she called back declining the medication so we would have to wait the full 30 days to try again patient understood and I assured the patient when the 30 days were up I would have the medication sent in and give the patient a call confirming it had been sent over.

## 2024-07-25 NOTE — Telephone Encounter (Signed)
 Copied from CRM #[id number]. Topic: Clinical - Medical Advice >> Jul 25, 2024  9:59 AM Briana Parker wrote: Reason for CRM: Patient is calling in because she would like a call back from doctor edwards regarding a acid reflex medication.

## 2024-07-25 NOTE — Progress Notes (Signed)
 Office Visit Note   Patient: Briana Parker           Date of Birth: 03-15-1966           MRN: [id number] Visit Date: 07/25/2024              Requested by: Celestia Rosaline SQUIBB, NP [address] [place],  [place] [address] PCP: Celestia Rosaline SQUIBB, NP   Assessment & Plan: Visit Diagnoses:  1. Trigger finger, left middle finger   2. Trigger finger, left little finger     Plan: History of Present Illness Briana Parker is a 58 year old female who presents with locking and pain in the left small and middle fingers.  Locking and pain in the left small finger have been present for two months, with pain localized to two knots on the finger. No prior treatment has been sought for this issue.  The left middle finger has experienced pain and swelling for years, without locking. Previous injections for the middle finger were ineffective.  She denies current use of blood thinners and is prediabetic.  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 Acute trigger finger in left small finger with locking and pain. Prefers surgical intervention. - Scheduled outpatient surgery for release of trigger finger in left small and middle fingers. - will coordinate with Marval on surgery date, preferably on a Friday - Advised obtaining a trigger finger splint from [facility] for symptomatic relief until surgery. - detailed surgical plan discussed  Follow-Up Instructions: No follow-ups on file.   Orders:  No orders of the defined types were placed in this encounter.  No orders of the defined types were placed in this encounter.     Procedures: No procedures performed   Clinical Data: No additional findings.   Subjective: Chief Complaint  Patient presents with   Left Hand - Pain     HPI  Review of Systems   Objective: Vital Signs: There were no vitals taken for this visit.  Physical Exam  Ortho Exam  Specialty Comments:  CLINICAL DATA:  Low back pain with radiculopathy   EXAM: MRI LUMBAR SPINE WITHOUT CONTRAST   TECHNIQUE: Multiplanar, multisequence MR imaging of the lumbar spine was performed. No intravenous contrast was administered.   COMPARISON:  MRI 07/25/2012   FINDINGS: Segmentation:  Standard.   Alignment:  Physiologic.   Vertebrae:  No fracture, evidence of discitis, or bone lesion.   Conus medullaris and cauda equina: Conus extends to the T12-L1 level. Conus and cauda equina appear normal.   Paraspinal and other soft tissues: Negative.   Disc levels:   T12-L1 and L1-L2: Unremarkable.   L2-L3: Minimal annular disc bulge.  No foraminal or canal stenosis.   L3-L4: Annular disc bulge without foraminal or canal stenosis.   L4-L5: Asymmetric disc height loss on the right. Broad-based disc bulge, eccentric to the right. Mild bilateral facet hypertrophy. Mild right subarticular recess stenosis without canal stenosis. Mild right foraminal stenosis. The previously seen large right paracentral disc protrusion at this level has largely receded.   L5-S1: Disc bulge and endplate spurring, eccentric to the left. Mild facet hypertrophy. No canal stenosis. Moderate-severe left foraminal stenosis. Borderline-mild right foraminal stenosis. Findings have progressed from prior.   IMPRESSION: 1. Multilevel lumbar spondylosis, most pronounced at L5-S1 where there is moderate-severe left  foraminal stenosis. Findings have progressed from prior. 2. Mild right foraminal stenosis at L4-L5. The previously seen large right paracentral disc protrusion at this level has largely receded. 3. No canal stenosis at any level.     Electronically Signed   By: Mabel Converse D.O.   On: 06/01/2023 10:42  Imaging: No results found.   PMFS  History: Patient Active Problem List   Diagnosis Date Noted   Healthcare maintenance 10/19/2021   Vasovagal syncopes 04/20/2021   Foot pain, right 04/20/2021   Pruritus 11/04/2019   Facial rash 11/04/2019   Achilles tendinitis of both lower extremities 10/10/2018   Trigger finger, left little finger 07/22/2018   Shoulder pain (Bilateral) 10/10/2017   Gastroesophageal reflux disease without esophagitis 10/10/2017   Asthma, exercise induced 04/13/2017   Upper respiratory infection 09/12/2016   Chronic low back pain 10/06/2015   Carpal tunnel syndrome, bilateral 02/12/2015   Menopausal syndrome (hot flashes) with mood lability, depression, sleep disturbance, and vaginal dryness  04/15/2014   Screening for diabetes mellitus 11/10/2013   Sinusitis, chronic 05/29/2013   HEMORRHOIDS 05/08/2007   Hyperlipidemia 07/13/2006   Past Medical History:  Diagnosis Date   Allergic rhinitis    Allergy    seasonal   Anxiety    Arthritis    back   Chronic bronchitis (HCC)    COPD (chronic obstructive pulmonary disease) (HCC)    Depression    Fibromyalgia    GERD (gastroesophageal reflux disease)    Hyperlipidemia    Low back pain     Family History  Problem Relation Age of Onset   Diabetes Mother        died recently in 2010-02-17 due to 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  She was on dialysis and died with cardiac arrest as per pt.   Heart disease Father    Hypertension Father    Coronary artery disease Other        Strong family history in first degree relatives   Colon cancer Neg Hx     Past Surgical History:  Procedure Laterality Date   ABDOMINAL HYSTERECTOMY     CARPAL TUNNEL RELEASE Right 05/05/2015   Procedure: RIGHT CARPAL TUNNEL RELEASE;  Surgeon: Kay CHRISTELLA Cummins, MD;  Location: [facility];  Service: Orthopedics;  Laterality: Right;   TRIGGER FINGER RELEASE Left 05/05/2015   Procedure: LEFT TRIGGER THUMB RELEASE;  Surgeon: Kay CHRISTELLA Cummins, MD;  Location: [facility];   Service: Orthopedics;  Laterality: Left;   WISDOM TOOTH EXTRACTION     Social History   Occupational History   Not on file  Tobacco Use   Smoking status: Never   Smokeless tobacco: Never  Substance and Sexual Activity   Alcohol use: No    Alcohol/week: 0.0 standard drinks of alcohol   Drug use: No   Sexual activity: Yes

## 2024-07-28 NOTE — Telephone Encounter (Signed)
 Will forward to provider

## 2024-07-29 ENCOUNTER — Telehealth: Payer: Self-pay | Admitting: Orthopaedic Surgery

## 2024-07-29 NOTE — Telephone Encounter (Signed)
 Pt called stating that Dr Jerri told her that her surgery would be this Friday or next Friday. Please call pt about this matter at [phone].

## 2024-08-01 ENCOUNTER — Ambulatory Visit: Admit: 2024-08-01 | Admitting: Orthopaedic Surgery

## 2024-08-01 SURGERY — RELEASE, A1 PULLEY, FOR TRIGGER FINGER
Anesthesia: Monitor Anesthesia Care | Site: Middle Finger | Laterality: Left

## 2024-08-10 ENCOUNTER — Telehealth: Admitting: Family

## 2024-08-10 DIAGNOSIS — J209 Acute bronchitis, unspecified: Secondary | ICD-10-CM | POA: Diagnosis not present

## 2024-08-10 MED ORDER — PREDNISONE 10 MG (21) PO TBPK: ORAL_TABLET | ORAL | 0 refills | Status: AC

## 2024-08-10 MED ORDER — BENZONATATE 200 MG PO CAPS: 200.0000 mg | ORAL_CAPSULE | Freq: Two times a day (BID) | ORAL | 0 refills | Status: AC | PRN

## 2024-08-10 NOTE — Progress Notes (Signed)
 We are sorry that you are not feeling well.  Here is how we plan to help!  Based on your presentation I believe you most likely have A cough due to a virus.  This is called viral bronchitis and is best treated by rest, plenty of fluids and control of the cough.  You may use Ibuprofen or Tylenol as directed to help your symptoms.     In addition you may use A non-prescription cough medication called Robitussin DAC. Take 2 teaspoons every 8 hours or Delsym: take 2 teaspoons every 12 hours., A non-prescription cough medication called Mucinex  DM: take 2 tablets every 12 hours., and A prescription cough medication called Tessalon  Perles 100mg . You may take 1-2 capsules every 8 hours as needed for your cough.  Prednisone  10 mg daily for 6 days (see taper instructions below)  Directions for 6 day taper: Day 1: 2 tablets before breakfast, 1 after both lunch & dinner and 2 at bedtime Day 2: 1 tab before breakfast, 1 after both lunch & dinner and 2 at bedtime Day 3: 1 tab at each meal & 1 at bedtime Day 4: 1 tab at breakfast, 1 at lunch, 1 at bedtime Day 5: 1 tab at breakfast & 1 tab at bedtime Day 6: 1 tab at breakfast  From your responses in the eVisit questionnaire you describe inflammation in the upper respiratory tract which is causing a significant cough.  This is commonly called Bronchitis and has four common causes:   Allergies Viral Infections Acid Reflux Bacterial Infection Allergies, viruses and acid reflux are treated by controlling symptoms or eliminating the cause. An example might be a cough caused by taking certain blood pressure medications. You stop the cough by changing the medication. Another example might be a cough caused by acid reflux. Controlling the reflux helps control the cough.  USE OF BRONCHODILATOR (RESCUE) INHALERS: There is a risk from using your bronchodilator too frequently.  The risk is that over-reliance on a medication which only relaxes the muscles surrounding  the breathing tubes can reduce the effectiveness of medications prescribed to reduce swelling and congestion of the tubes themselves.  Although you feel brief relief from the bronchodilator inhaler, your asthma may actually be worsening with the tubes becoming more swollen and filled with mucus.  This can delay other crucial treatments, such as oral steroid medications. If you need to use a bronchodilator inhaler daily, several times per day, you should discuss this with your provider.  There are probably better treatments that could be used to keep your asthma under control.     HOME CARE Only take medications as instructed by your medical team. Complete the entire course of an antibiotic. Drink plenty of fluids and get plenty of rest. Avoid close contacts especially the very young and the elderly Cover your mouth if you cough or cough into your sleeve. Always remember to wash your hands A steam or ultrasonic humidifier can help congestion.   GET HELP RIGHT AWAY IF: You develop worsening fever. You become short of breath You cough up blood. Your symptoms persist after you have completed your treatment plan MAKE SURE YOU  Understand these instructions. Will watch your condition. Will get help right away if you are not doing well or get worse.  Your e-visit answers were reviewed by a board certified advanced clinical practitioner to complete your personal care plan.  Depending on the condition, your plan could have included both over the counter or prescription medications. If there  is a problem please reply  once you have received a response from your provider. Your safety is important to us .  If you have drug allergies check your prescription carefully.    You can use MyChart to ask questions about today's visit, request a non-urgent call back, or ask for a work or school excuse for 24 hours related to this e-Visit. If it has been greater than 24 hours you will need to follow up with your  provider, or enter a new e-Visit to address those concerns. You will get an e-mail in the next two days asking about your experience.  I hope that your e-visit has been valuable and will speed your recovery. Thank you for using e-visits.   I have spent 5 minutes in review of e-visit questionnaire, review and updating patient chart, medical decision making and response to patient.   Bari Learn, FNP

## 2024-08-11 ENCOUNTER — Telehealth: Payer: Self-pay | Admitting: Orthopaedic Surgery

## 2024-08-11 ENCOUNTER — Telehealth (INDEPENDENT_AMBULATORY_CARE_PROVIDER_SITE_OTHER): Payer: Self-pay

## 2024-08-11 NOTE — Telephone Encounter (Signed)
 Pt called asking for a call back. Pt states she dont know what time she is to be to her procedure. Please call pt this Am at [phone].

## 2024-08-11 NOTE — Telephone Encounter (Signed)
 Patient called asking if we could send that medication again. I explained that I would message you and call her back to let her know if it was sent.

## 2024-08-12 ENCOUNTER — Other Ambulatory Visit (INDEPENDENT_AMBULATORY_CARE_PROVIDER_SITE_OTHER): Payer: Self-pay

## 2024-08-12 MED ORDER — PANTOPRAZOLE SODIUM 40 MG PO TBEC: 40.0000 mg | DELAYED_RELEASE_TABLET | Freq: Two times a day (BID) | ORAL | 0 refills | Status: AC

## 2024-08-14 DIAGNOSIS — J449 Chronic obstructive pulmonary disease, unspecified: Secondary | ICD-10-CM | POA: Diagnosis not present

## 2024-08-14 DIAGNOSIS — R32 Unspecified urinary incontinence: Secondary | ICD-10-CM | POA: Diagnosis not present

## 2024-08-15 ENCOUNTER — Telehealth: Payer: Self-pay | Admitting: Orthopaedic Surgery

## 2024-08-15 DIAGNOSIS — M65332 Trigger finger, left middle finger: Secondary | ICD-10-CM | POA: Diagnosis not present

## 2024-08-15 DIAGNOSIS — M65352 Trigger finger, left little finger: Secondary | ICD-10-CM | POA: Diagnosis not present

## 2024-08-15 MED ORDER — TRAMADOL HCL 50 MG PO TABS: 50.0000 mg | ORAL_TABLET | Freq: Four times a day (QID) | ORAL | 0 refills | Status: AC | PRN

## 2024-08-15 NOTE — Addendum Note (Signed)
 Addended by: JERRI LOISE SHARPER on: 08/15/2024 05:19 PM   Modules accepted: Orders

## 2024-08-15 NOTE — Telephone Encounter (Signed)
 Patient called and said at the surgery center they don't know how long to keep the wrap on and he didn't call in the pain medication. [phone]

## 2024-08-21 ENCOUNTER — Telehealth: Payer: Self-pay | Admitting: *Deleted

## 2024-08-21 NOTE — Telephone Encounter (Signed)
 To set up colonoscopy. LM with call back #

## 2024-08-22 ENCOUNTER — Ambulatory Visit: Admitting: Physician Assistant

## 2024-08-22 DIAGNOSIS — M65332 Trigger finger, left middle finger: Secondary | ICD-10-CM

## 2024-08-22 DIAGNOSIS — M65352 Trigger finger, left little finger: Secondary | ICD-10-CM

## 2024-08-22 NOTE — Progress Notes (Signed)
 Post-Op Visit Note   Patient: Briana Parker           Date of Birth: May 01, 1966           MRN: [id number] Visit Date: 08/22/2024 PCP: Celestia Rosaline SQUIBB, NP   Assessment & Plan:  Chief Complaint:  Chief Complaint  Patient presents with   Left Hand - Follow-up    Left middle and small trigger finger release 08/15/2024   Visit Diagnoses:  1. Trigger finger, left little finger   2. Trigger finger, left middle finger     Plan: Patient is a pleasant 58 year old female who comes in today 1 week status post left long and small trigger finger release 08/15/2024.  She has been doing okay.  She has only been taking Tylenol  for pain.  Examination of the left hand reveals well-healing surgical incisions with nylon sutures in place.  No evidence of infection or cellulitis.  Fingers warm well-perfused.  She is neurovascular intact distally.  Today, her wound is clean and recovered.  No heavy lifting or getting her left hand wet for another week.  Follow-up next week for suture removal.  She may begin nerve gliding exercises.  Call with concerns or questions.  Follow-Up Instructions: Return in about 1 week (around 08/29/2024).   Orders:  No orders of the defined types were placed in this encounter.  No orders of the defined types were placed in this encounter.   Imaging: No new imaging  PMFS History: Patient Active Problem List   Diagnosis Date Noted   Healthcare maintenance 10/19/2021   Vasovagal syncopes 04/20/2021   Foot pain, right 04/20/2021   Pruritus 11/04/2019   Facial rash 11/04/2019   Achilles tendinitis of both lower extremities 10/10/2018   Trigger finger, left little finger 07/22/2018   Shoulder pain (Bilateral) 10/10/2017   Gastroesophageal reflux disease without esophagitis 10/10/2017   Asthma, exercise induced 04/13/2017   Upper respiratory infection 09/12/2016   Chronic low back pain 10/06/2015   Carpal tunnel syndrome, bilateral 02/12/2015   Menopausal  syndrome (hot flashes) with mood lability, depression, sleep disturbance, and vaginal dryness  04/15/2014   Screening for diabetes mellitus 11/10/2013   Sinusitis, chronic 05/29/2013   HEMORRHOIDS 05/08/2007   Hyperlipidemia 07/13/2006   Past Medical History:  Diagnosis Date   Allergic rhinitis    Allergy    seasonal   Anxiety    Arthritis    back   Chronic bronchitis (HCC)    COPD (chronic obstructive pulmonary disease) (HCC)    Depression    Fibromyalgia    GERD (gastroesophageal reflux disease)    Hyperlipidemia    Low back pain     Family History  Problem Relation Age of Onset   Diabetes Mother        died recently in 30-Jan-2010 due to 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  She was on dialysis and died with cardiac arrest as per pt.   Heart disease Father    Hypertension Father    Coronary artery disease Other        Strong family history in first degree relatives   Colon cancer Neg Hx     Past Surgical History:  Procedure Laterality Date   ABDOMINAL HYSTERECTOMY     CARPAL TUNNEL RELEASE Right 05/05/2015   Procedure: RIGHT CARPAL TUNNEL RELEASE;  Surgeon: Kay CHRISTELLA Cummins, MD;  Location: [facility];  Service: Orthopedics;  Laterality: Right;   TRIGGER FINGER RELEASE Left 05/05/2015   Procedure: LEFT TRIGGER THUMB RELEASE;  Surgeon: Kay CHRISTELLA Cummins, MD;  Location: [facility];  Service: Orthopedics;  Laterality: Left;   WISDOM TOOTH EXTRACTION     Social History   Occupational History   Not on file  Tobacco Use   Smoking status: Never   Smokeless tobacco: Never  Substance and Sexual Activity   Alcohol use: No    Alcohol/week: 0.0 standard drinks of alcohol   Drug use: No   Sexual activity: Yes

## 2024-08-28 ENCOUNTER — Ambulatory Visit: Admitting: Physician Assistant

## 2024-08-28 DIAGNOSIS — M65352 Trigger finger, left little finger: Secondary | ICD-10-CM

## 2024-08-28 DIAGNOSIS — M65332 Trigger finger, left middle finger: Secondary | ICD-10-CM

## 2024-08-28 MED ORDER — TRAMADOL HCL 50 MG PO TABS: 50.0000 mg | ORAL_TABLET | Freq: Two times a day (BID) | ORAL | 0 refills | Status: AC | PRN

## 2024-08-28 NOTE — Progress Notes (Signed)
 Post-Op Visit Note   Patient: Briana Parker           Date of Birth: 1966/02/09           MRN: [id number] Visit Date: 08/28/2024 PCP: Celestia Rosaline SQUIBB, NP   Assessment & Plan:  Chief Complaint:  Chief Complaint  Patient presents with   Left Hand - Follow-up    Left middle and small trigger finger release 08/15/2024   Visit Diagnoses:  1. Trigger finger, left middle finger   2. Trigger finger, left little finger     Plan: Patient is a pleasant 58 year old female who comes in today 2 weeks status post left long and little finger trigger finger release 08/15/2024.  She has been doing okay.  She still notes some discomfort.  Examination of her left hand reveals well-healed surgical incision with nylon sutures in place.  No evidence of infection or cellulitis.  Fingers warm well-perfused.  She is neurovascular intact distally.  Today, sutures were removed and Steri-Strips applied.  She will continue with nerve gliding exercises.  Follow-up in 2 weeks for recheck.  Call with concerns or questions.  Follow-Up Instructions: Return in about 2 weeks (around 09/11/2024).   Orders:  No orders of the defined types were placed in this encounter.  Meds ordered this encounter  Medications   traMADol  (ULTRAM ) 50 MG tablet    Sig: Take 1-2 tablets (50-100 mg total) by mouth 2 (two) times daily as needed.    Dispense:  30 tablet    Refill:  0    Imaging: No new imaging  PMFS History: Patient Active Problem List   Diagnosis Date Noted   Healthcare maintenance 10/19/2021   Vasovagal syncopes 04/20/2021   Foot pain, right 04/20/2021   Pruritus 11/04/2019   Facial rash 11/04/2019   Achilles tendinitis of both lower extremities 10/10/2018   Trigger finger, left little finger 07/22/2018   Shoulder pain (Bilateral) 10/10/2017   Gastroesophageal reflux disease without esophagitis 10/10/2017   Asthma, exercise induced 04/13/2017   Upper respiratory infection 09/12/2016   Chronic low back  pain 10/06/2015   Carpal tunnel syndrome, bilateral 02/12/2015   Menopausal syndrome (hot flashes) with mood lability, depression, sleep disturbance, and vaginal dryness  04/15/2014   Screening for diabetes mellitus 11/10/2013   Sinusitis, chronic 05/29/2013   HEMORRHOIDS 05/08/2007   Hyperlipidemia 07/13/2006   Past Medical History:  Diagnosis Date   Allergic rhinitis    Allergy    seasonal   Anxiety    Arthritis    back   Chronic bronchitis (HCC)    COPD (chronic obstructive pulmonary disease) (HCC)    Depression    Fibromyalgia    GERD (gastroesophageal reflux disease)    Hyperlipidemia    Low back pain     Family History  Problem Relation Age of Onset   Diabetes Mother        died recently in 02/01/2010 due to 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  She was on dialysis and died with cardiac arrest as per pt.   Heart disease Father    Hypertension Father    Coronary artery disease Other        Strong family history in first degree relatives   Colon cancer Neg Hx     Past Surgical History:  Procedure Laterality Date   ABDOMINAL HYSTERECTOMY     CARPAL TUNNEL RELEASE Right 05/05/2015   Procedure: RIGHT CARPAL TUNNEL RELEASE;  Surgeon: Kay CHRISTELLA Cummins, MD;  Location: [facility];  Service: Orthopedics;  Laterality: Right;   TRIGGER FINGER RELEASE Left 05/05/2015   Procedure: LEFT TRIGGER THUMB RELEASE;  Surgeon: Kay CHRISTELLA Cummins, MD;  Location: [facility];  Service: Orthopedics;  Laterality: Left;   WISDOM TOOTH EXTRACTION     Social History   Occupational History   Not on file  Tobacco Use   Smoking status: Never   Smokeless tobacco: Never  Substance and Sexual Activity   Alcohol use: No    Alcohol/week: 0.0 standard drinks of alcohol   Drug use: No   Sexual activity: Yes

## 2024-08-29 ENCOUNTER — Encounter (INDEPENDENT_AMBULATORY_CARE_PROVIDER_SITE_OTHER): Payer: Self-pay

## 2024-08-29 ENCOUNTER — Other Ambulatory Visit (INDEPENDENT_AMBULATORY_CARE_PROVIDER_SITE_OTHER): Payer: Self-pay | Admitting: Primary Care

## 2024-08-29 ENCOUNTER — Ambulatory Visit (INDEPENDENT_AMBULATORY_CARE_PROVIDER_SITE_OTHER): Admitting: Audiology

## 2024-08-29 ENCOUNTER — Other Ambulatory Visit: Payer: Self-pay | Admitting: Pulmonary Disease

## 2024-08-29 ENCOUNTER — Ambulatory Visit (INDEPENDENT_AMBULATORY_CARE_PROVIDER_SITE_OTHER)

## 2024-08-29 VITALS — BP 112/78 | HR 81

## 2024-08-29 DIAGNOSIS — R519 Headache, unspecified: Secondary | ICD-10-CM | POA: Diagnosis not present

## 2024-08-29 DIAGNOSIS — Z760 Encounter for issue of repeat prescription: Secondary | ICD-10-CM

## 2024-08-29 DIAGNOSIS — H9313 Tinnitus, bilateral: Secondary | ICD-10-CM | POA: Diagnosis not present

## 2024-08-29 DIAGNOSIS — H903 Sensorineural hearing loss, bilateral: Secondary | ICD-10-CM | POA: Diagnosis not present

## 2024-08-29 DIAGNOSIS — J343 Hypertrophy of nasal turbinates: Secondary | ICD-10-CM | POA: Diagnosis not present

## 2024-08-29 DIAGNOSIS — J329 Chronic sinusitis, unspecified: Secondary | ICD-10-CM

## 2024-08-29 DIAGNOSIS — J4540 Moderate persistent asthma, uncomplicated: Secondary | ICD-10-CM

## 2024-08-29 DIAGNOSIS — R0982 Postnasal drip: Secondary | ICD-10-CM

## 2024-08-29 DIAGNOSIS — J3489 Other specified disorders of nose and nasal sinuses: Secondary | ICD-10-CM

## 2024-08-29 NOTE — Progress Notes (Signed)
 Dear Dr. Celestia, Here is my assessment for our mutual patient, Briana Parker. Thank you for allowing me the opportunity to care for your patient. Please do not hesitate to contact me should you have any other questions. Sincerely, Dr. Hadassah Parody  Otolaryngology Clinic Note Referring provider: Dr. Celestia HPI:   Initial HPI (06/27/2024) Discussed the use of AI scribe software for clinical note transcription with the patient, who gave verbal consent to proceed.  Briana Parker is a 58 year old female with a history of sinus issues who presents with chronic sinus problems and tinnitus She was referred by her pulmonary doctor for recurrent sinus problems.  Facial pain, sinus pressure - Reports chronic persistent facial pain and headache  - Some nasal congestion, particularly at night - Limited relief with Flonase  and saline rinses - Recent course of antibiotics provided no improvement - Reports no discolored drainage from nose  Tinnitus and auditory disturbance Bilateral hearing loss  - Persistent bilateral ear ringing for approximately three years and becoming more bothersome  - Difficulty hearing conversations, requiring others to repeat herself - Frequent earaches - No prior hearing test performed - Frequent ear cleaning - Perceived progressive worsening of hearing over time  History of Present Illness  --------------------------------------------------------- 08/29/2024   Here for follow-up after audiogram and to evaluate response to antacid.  Continues to have bothersome ringing in her ears bilaterally.  Reports that phlegm and postnasal drip has improved in the last 2 weeks.  She has only been using the Protonix  twice daily over the last couple of weeks.  So she is not able to tell if this is truly helped or not.    Independent Review of Additional Tests or Records:  Referral   CT sinus 04/13/24 independently reviewed and shows clear sinus cavities , inferior  turbinate hypertrophy right > left   08/29/24 Audiogram was independently reviewed and interpreted by me and it reveals Right ear: Normal sloping to moderate sensorineural hearing loss in the high frequencies; 100% word interpretation at 65 dB; type A tympanogram Left ear: Normal sloping to moderate sensorineural hearing loss in high frequencies; 100% word interpretation at 65 dB; type A tympanogram   PMH/Meds/All/SocHx/FamHx/ROS:   Past Medical History:  Diagnosis Date   Allergic rhinitis    Allergy    seasonal   Anxiety    Arthritis    back   Chronic bronchitis (HCC)    COPD (chronic obstructive pulmonary disease) (HCC)    Depression    Fibromyalgia    GERD (gastroesophageal reflux disease)    Hyperlipidemia    Low back pain      Past Surgical History:  Procedure Laterality Date   ABDOMINAL HYSTERECTOMY     CARPAL TUNNEL RELEASE Right 05/05/2015   Procedure: RIGHT CARPAL TUNNEL RELEASE;  Surgeon: Kay CHRISTELLA Cummins, MD;  Location: [facility];  Service: Orthopedics;  Laterality: Right;   TRIGGER FINGER RELEASE Left 05/05/2015   Procedure: LEFT TRIGGER THUMB RELEASE;  Surgeon: Kay CHRISTELLA Cummins, MD;  Location: [facility];  Service: Orthopedics;  Laterality: Left;   WISDOM TOOTH EXTRACTION      Family History  Problem Relation Age of Onset   Diabetes Mother        died recently in 02/27/2010 due to 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  She was on dialysis and died with cardiac arrest as per pt.   Heart disease Father    Hypertension Father    Coronary artery disease Other  Strong family history in first degree relatives   Colon cancer Neg Hx      Social Connections: Socially Integrated (06/30/2024)   Social Connection and Isolation Panel    Frequency of Communication with Friends and Family: More than three times a week    Frequency of Social Gatherings with Friends and Family: Twice a week    Attends Religious Services: More than 4 times per year    Active  Member of [employer] or Organizations: Yes    Attends Engineer, Structural: Not on file    Marital Status: Married     Current Outpatient Medications  Medication Instructions   baclofen  (LIORESAL ) 10 mg, Oral, 3 times daily   benzonatate  (TESSALON ) 200 mg, Oral, 2 times daily PRN   cetirizine  (ZYRTEC ) 10 mg, Oral, Daily   doxycycline  (VIBRA -TABS) 100 mg, Oral, 2 times daily   fluticasone  (FLONASE ) 50 MCG/ACT nasal spray Use 1 spray(s) in each nostril once daily   ipratropium (ATROVENT ) 0.03 % nasal spray 2 sprays, Each Nare, Every 12 hours   ipratropium-albuterol  (DUONEB) 0.5-2.5 (3) MG/3ML SOLN 3 mLs, Nebulization, Every 4 hours PRN   Misc. Devices MISC Nebulizer Mask for nebulizer machine   mometasone -formoterol  (DULERA ) 100-5 MCG/ACT AERO 2 puffs, Inhalation, 2 times daily   pantoprazole  (PROTONIX ) 40 mg, Oral, 2 times daily   predniSONE  (STERAPRED UNI-PAK 21 TAB) 10 MG (21) TBPK tablet Use as directed   traMADol  (ULTRAM ) 50 mg, Oral, Every 6 hours PRN   traMADol  (ULTRAM ) 50-100 mg, Oral, 2 times daily PRN   VENTOLIN  HFA 108 (90 Base) MCG/ACT inhaler 1-2 puffs, Inhalation, Every 6 hours PRN     Physical Exam:   BP 112/78 (BP Location: Left Arm, Patient Position: Sitting)   Pulse 81   SpO2 95%   Salient findings:  CN II-XII intact  Bilateral EAC clear with no visible cerumen and canal skin shiny slightly irritated, TM intact bilaterally with aerated middle ear spaces   No lesions of oral cavity/oropharynx  No respiratory distress or stridor  Seprately Identifiable Procedures:  Prior to initiating any procedures, risks/benefits/alternatives were explained to the patient and verbal consent obtained. none   Impression & Plans:  Briana Parker is a 58 y.o. female with   1. Bilateral sensorineural hearing loss   2. Nasal obstruction   3. Facial pain   4. Postnasal drip   5. Bilateral tinnitus      Assessment and Plan Assessment & Plan Post-nasal drip  Throat  mucus and postnasal drainage can have multiple causes but discussed one common contributor is GERD - Pt has GERD and currently on protonix  40 daily.  I increased her to 40 twice daily.  She only recently started taking this and does not feel like she has taken for long enough time to show effect -however she has noted in the last 2 weeks that her postnasal drip and thick mucus has improved so admits it is possible that things are getting better.  Discussed that I like her to stay on it for 3 months.  If no improvement can take her off.  If she does improve we will work on tapering.  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 Flonase  has limited success. Surgery discussed but not preferred at this time by patient  - Continue Flonase  as previously prescribed, if no benefit can trial other steroid based nasal sprays  Bilateral tinnitus  Bilateral sensorineural hearing loss -Audiogram on 08/29/2024's shows downsloping high-frequency sensorineural hearing loss, presbycusis -Discussed tinnitus likely due to hearing loss.  Discussed could trial hearing aids to assist versus referral to tinnitus specialist.  She wants to trial hearing aids first.  Chronic facial pain  - CT shows no evidence of chronic sinusitis. I think her sinus related pain is most likely due to facial pain syndrome / migraine related  - Recommend discussing with PCP to consider migraine medication if indicated    See below regarding exact medications prescribed this encounter including dosages and route: No orders of the defined types were placed in this encounter.     Thank you for allowing me the opportunity to care for your patient. Please do not hesitate to contact me should you have any other questions.  Sincerely, Hadassah Parody, MD Otolaryngologist (ENT), [facility] Phone: [phone] Fax: [phone]  MDM:  Level 4 Complexity/Problems addressed: 4 -2 chronic problem Data complexity: 4-independent review of audiogram - Morbidity: Low - Prescription Drug prescribed or managed: No

## 2024-08-29 NOTE — Progress Notes (Signed)
" °  [address] [place], [place] [address] [phone]  Audiological Evaluation    Name: Briana Parker     DOB:   21-Sep-1965      MRN:   [id number]                                                                                     Service Date: 08/29/2024     Accompanied by: self    Patient comes today after Dr. Greggory, ENT sent a referral for a hearing evaluation due to concerns with tinnitus.   Symptoms Yes Details  Hearing loss  [x]  Reports that she turns up the TV up - per family   Tinnitus  [x]  Both ears - constant  and bothersome  Ear pain/ infections/pressure  []  Reports has sinus issues that cause ear infections  Balance problems  []    Noise exposure history  []    Previous ear surgeries  []    Family history of hearing loss  []    Amplification  []    Other  []      Otoscopy: Right ear: Clear external ear canal and notable landmarks visualized on the tympanic membrane. Left ear:  Clear external ear canal and notable landmarks visualized on the tympanic membrane.  Tympanometry: Right ear: Type A - Normal external ear canal volume with normal middle ear pressure and normal tympanic membrane compliance. Findings are consistent with normal middle ear function. Left ear: Type A - Normal external ear canal volume with normal middle ear pressure and normal tympanic membrane compliance. Findings are consistent with normal middle ear function.    Hearing Evaluation The hearing test results were completed under headphones and results are deemed to be of good reliability. Test technique:  conventional    Pure tone Audiometry: Both ears - Normal to moderate sensorineural hearing loss from 250 Hz - 8000 Hz.  Speech Audiometry: Right ear- Speech Reception Threshold (SRT) was obtained at 25 dBHL. Left ear-Speech Reception Threshold (SRT) was obtained at 25 dBHL.   Word Recognition Score Tested using NU-6 (recorded) Right ear: 100% was obtained at a  presentation level of 65 dBHL with contralateral masking which is deemed as  excellent. Left ear: 100% was obtained at a presentation level of 65 dBHL with contralateral masking which is deemed as  excellent.   Impression: There is not a significant difference in pure-tone thresholds between ears., There is not a significant difference in the word recognition score in between ears.    Recommendations: Follow up with ENT as scheduled. Return for a hearing evaluation if concerns with hearing changes arise or per MD recommendation. Consider various tinnitus strategies, including the use of a sound generator, hearing aids, and/or tinnitus retraining therapy.  Consider a communication needs assessment for amplification after medical clearance is obtained, if needed.   Xochil Shanker MARIE LEROUX-MARTINEZ, AUD  "

## 2024-09-01 NOTE — Telephone Encounter (Signed)
 Will forward to provider

## 2024-09-25 ENCOUNTER — Other Ambulatory Visit: Payer: Self-pay | Admitting: Family

## 2024-09-25 ENCOUNTER — Other Ambulatory Visit (INDEPENDENT_AMBULATORY_CARE_PROVIDER_SITE_OTHER): Payer: Self-pay | Admitting: Primary Care

## 2024-09-25 ENCOUNTER — Other Ambulatory Visit (INDEPENDENT_AMBULATORY_CARE_PROVIDER_SITE_OTHER): Payer: Self-pay

## 2024-09-25 DIAGNOSIS — J209 Acute bronchitis, unspecified: Secondary | ICD-10-CM

## 2024-09-25 DIAGNOSIS — J329 Chronic sinusitis, unspecified: Secondary | ICD-10-CM

## 2024-09-25 MED ORDER — CETIRIZINE HCL 10 MG PO TABS: 10.0000 mg | ORAL_TABLET | Freq: Every day | ORAL | 1 refills | Status: AC

## 2024-10-16 ENCOUNTER — Other Ambulatory Visit (INDEPENDENT_AMBULATORY_CARE_PROVIDER_SITE_OTHER): Payer: Self-pay | Admitting: Primary Care

## 2024-10-16 DIAGNOSIS — Z760 Encounter for issue of repeat prescription: Secondary | ICD-10-CM

## 2024-10-16 DIAGNOSIS — J329 Chronic sinusitis, unspecified: Secondary | ICD-10-CM
